# Patient Record
Sex: Female | Born: 1960 | Race: White | Hispanic: No | Marital: Single | Smoking: Current every day smoker
Health system: Southern US, Community
[De-identification: ages and names within clinical notes are randomized; demographics above are authoritative.]

## PROBLEM LIST (undated history)

## (undated) DIAGNOSIS — I639 Cerebral infarction, unspecified: Secondary | ICD-10-CM

## (undated) DIAGNOSIS — R7303 Prediabetes: Secondary | ICD-10-CM

## (undated) DIAGNOSIS — F32A Depression, unspecified: Secondary | ICD-10-CM

## (undated) DIAGNOSIS — K219 Gastro-esophageal reflux disease without esophagitis: Secondary | ICD-10-CM

## (undated) DIAGNOSIS — I10 Essential (primary) hypertension: Secondary | ICD-10-CM

## (undated) DIAGNOSIS — F419 Anxiety disorder, unspecified: Secondary | ICD-10-CM

## (undated) DIAGNOSIS — Z8619 Personal history of other infectious and parasitic diseases: Secondary | ICD-10-CM

## (undated) DIAGNOSIS — M1990 Unspecified osteoarthritis, unspecified site: Secondary | ICD-10-CM

## (undated) DIAGNOSIS — F909 Attention-deficit hyperactivity disorder, unspecified type: Secondary | ICD-10-CM

## (undated) DIAGNOSIS — F329 Major depressive disorder, single episode, unspecified: Secondary | ICD-10-CM

## (undated) HISTORY — DX: Major depressive disorder, single episode, unspecified: F32.9

## (undated) HISTORY — DX: Depression, unspecified: F32.A

## (undated) HISTORY — DX: Gastro-esophageal reflux disease without esophagitis: K21.9

## (undated) HISTORY — DX: Attention-deficit hyperactivity disorder, unspecified type: F90.9

## (undated) HISTORY — DX: Anxiety disorder, unspecified: F41.9

## (undated) HISTORY — PX: COLONOSCOPY: SHX174

## (undated) HISTORY — DX: Personal history of other infectious and parasitic diseases: Z86.19

---
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
## 1983-10-01 HISTORY — PX: CHOLECYSTECTOMY: SHX55

## 1993-09-30 HISTORY — PX: VAGINAL HYSTERECTOMY: SUR661

## 1993-09-30 HISTORY — PX: BLADDER SUSPENSION: SHX72

## 1994-09-30 LAB — HM COLONOSCOPY

## 1998-02-22 ENCOUNTER — Other Ambulatory Visit: Admission: RE | Admit: 1998-02-22 | Discharge: 1998-02-22 | Payer: Self-pay | Admitting: Obstetrics and Gynecology

## 2011-02-05 ENCOUNTER — Ambulatory Visit: Payer: Self-pay | Admitting: Internal Medicine

## 2011-02-05 LAB — HM MAMMOGRAPHY

## 2011-12-25 ENCOUNTER — Encounter: Payer: Self-pay | Admitting: Internal Medicine

## 2011-12-25 ENCOUNTER — Ambulatory Visit (INDEPENDENT_AMBULATORY_CARE_PROVIDER_SITE_OTHER): Payer: PRIVATE HEALTH INSURANCE | Admitting: Internal Medicine

## 2011-12-25 VITALS — BP 124/78 | HR 83 | Temp 98.1°F | Ht 64.5 in | Wt 121.0 lb

## 2011-12-25 DIAGNOSIS — G4700 Insomnia, unspecified: Secondary | ICD-10-CM

## 2011-12-25 DIAGNOSIS — F411 Generalized anxiety disorder: Secondary | ICD-10-CM

## 2011-12-25 DIAGNOSIS — F419 Anxiety disorder, unspecified: Secondary | ICD-10-CM | POA: Insufficient documentation

## 2011-12-25 DIAGNOSIS — R42 Dizziness and giddiness: Secondary | ICD-10-CM

## 2011-12-25 DIAGNOSIS — F909 Attention-deficit hyperactivity disorder, unspecified type: Secondary | ICD-10-CM

## 2011-12-25 LAB — HM MAMMOGRAPHY: HM Mammogram: NORMAL

## 2011-12-25 MED ORDER — CLONAZEPAM 0.5 MG PO TABS: 0.5000 mg | ORAL_TABLET | Freq: Every evening | ORAL | Status: DC | PRN

## 2011-12-25 NOTE — Assessment & Plan Note (Signed)
Secondary to anxiety. Improved with the use of Clonopin. We'll continue. Follow up 1 month.

## 2011-12-25 NOTE — Assessment & Plan Note (Signed)
No improvement would change to Wellbutrin. However, patient has recently been on dexamethasone which likely exacerbated her symptoms. We'll continue to monitor for now and plan followup in one month. If no improvement, will consider increasing dose of Wellbutrin to 300 mg daily.

## 2011-12-25 NOTE — Progress Notes (Signed)
Subjective:    Patient ID: Dana Benson, female    DOB: 10-Jul-1961, 51 y.o.   MRN: [id number]  HPI 51 year old female with history of ADHD and anxiety presents to establish care. She reports that her primary concern recently has been of dizziness and hearing loss in her left ear. She has been evaluated by Dr. Willeen Cass at ENT and he recommended that she have MRI brain. She reports that she initially refused to have the study and has delayed the exam for 2 weeks. She is currently taking dexamethasone to help with her dizziness. She reports significant increase in anxiety, sweats, and insomnia with the use of dexamethasone.  In regards to her anxiety, she reports she was previously treated with Celexa. She was recently changed to Wellbutrin because she felt that the Celexa was not helping. She continues to have significant anxiety and has not noticed improvement with wellbutrin. She has never been evaluated by psychiatry. She is also treated for ADHD but does not recall being tested for this.  Outpatient Encounter Prescriptions as of 12/25/2011  Medication Sig Dispense Refill  . amphetamine-dextroamphetamine (ADDERALL) 10 MG tablet Take 10 mg by mouth 2 (two) times daily.      Marland Kitchen buPROPion (WELLBUTRIN XL) 150 MG 24 hr tablet Take 150 mg by mouth daily.      . clonazePAM (KLONOPIN) 0.5 MG tablet Take 1 tablet (0.5 mg total) by mouth at bedtime as needed.  30 tablet  3  . dexamethasone (DECADRON) 4 MG tablet Take 4 mg by mouth 4 (four) times daily. X 14 days then start on 1 mg      . meclizine (ANTIVERT) 25 MG tablet Take 25 mg by mouth 3 (three) times daily as needed.      Marland Kitchen DISCONTD: clonazePAM (KLONOPIN) 0.5 MG tablet Take 0.5 mg by mouth at bedtime as needed.        Review of Systems  Constitutional: Negative for fever, chills, appetite change, fatigue and unexpected weight change.  HENT: Positive for hearing loss. Negative for ear pain, congestion, sore throat, trouble swallowing, neck pain,  voice change and sinus pressure.   Eyes: Negative for visual disturbance.  Respiratory: Negative for cough, shortness of breath, wheezing and stridor.   Cardiovascular: Negative for chest pain, palpitations and leg swelling.  Gastrointestinal: Negative for nausea, vomiting, abdominal pain, diarrhea, constipation, blood in stool, abdominal distention and anal bleeding.  Genitourinary: Negative for dysuria and flank pain.  Musculoskeletal: Negative for myalgias, arthralgias and gait problem.  Skin: Negative for color change and rash.  Neurological: Positive for dizziness. Negative for headaches.  Hematological: Negative for adenopathy. Does not bruise/bleed easily.  Psychiatric/Behavioral: Negative for suicidal ideas, sleep disturbance and dysphoric mood. The patient is nervous/anxious.        Objective:   Physical Exam  Constitutional: She is oriented to person, place, and time. She appears well-developed and well-nourished. No distress.  HENT:  Head: Normocephalic and atraumatic.  Right Ear: External ear normal.  Left Ear: External ear normal.  Nose: Nose normal.  Mouth/Throat: Oropharynx is clear and moist. No oropharyngeal exudate.  Eyes: Conjunctivae are normal. Pupils are equal, round, and reactive to light. Right eye exhibits no discharge. Left eye exhibits no discharge. No scleral icterus.  Neck: Normal range of motion. Neck supple. No tracheal deviation present. No thyromegaly present.  Cardiovascular: Normal rate, regular rhythm, normal heart sounds and intact distal pulses.  Exam reveals no gallop and no friction rub.   No murmur heard. Pulmonary/Chest: Effort  normal and breath sounds normal. No respiratory distress. She has no wheezes. She has no rales. She exhibits no tenderness.  Abdominal: Soft. Bowel sounds are normal. She exhibits no distension. There is no tenderness.  Musculoskeletal: Normal range of motion. She exhibits no edema and no tenderness.  Lymphadenopathy:     She has no cervical adenopathy.  Neurological: She is alert and oriented to person, place, and time. No cranial nerve deficit. She exhibits normal muscle tone. Coordination normal.  Skin: Skin is warm and dry. No rash noted. She is not diaphoretic. No erythema. No pallor.  Psychiatric: Her behavior is normal. Judgment and thought content normal. Her mood appears anxious.          Assessment & Plan:

## 2011-12-25 NOTE — Assessment & Plan Note (Signed)
Strongly encouraged her to follow through with the recommendations of her ENT physician to get MRI brain given concerning findings of hearing loss in the left ear. Will obtain records on evaluation and management. Followup one month.

## 2011-12-25 NOTE — Assessment & Plan Note (Signed)
Need to get records on previous evaluation. If she has never been evaluated by psychiatry, will plan to set her up for testing.

## 2012-01-02 ENCOUNTER — Telehealth: Payer: Self-pay | Admitting: Internal Medicine

## 2012-01-02 NOTE — Telephone Encounter (Signed)
PLEASE CALL Dana Benson AT WORK - [phone] or  Cell # [phone]  After 1600.  Caller: Kimori/Patient; PCP: Ronna Polio; CB#: [phone]; ; ; Call regarding Sleeping Concerns and Anxiety. If Not Able To Reach At Her Desk Please Call [phone];  [name] states she is taking currently taking Dexamethasone x 4 weeks ordered by ENT. Has had 25 % hearing loss and may have a growth in her ear. Scheduled for MRI next week. [name] states since taking steroid has had extreme fatigue, only getting about 3 hrs restless sleep every night, anxiety leading to intermittent painc attacks and blurred vision. States she has a family to take care of and has a job. States she has notified ENT provider of symptoms and states Meclizine was refilled.  Per anxiety protocol has call provider within 24 hrs disposition with provider due to increasing symptoms and taking meds as prescribed.

## 2012-01-02 NOTE — Telephone Encounter (Signed)
I scheduled pt for apt at 1:15 tomorrow (advised to come in at 1)

## 2012-01-02 NOTE — Telephone Encounter (Signed)
Needs to be seen

## 2012-01-02 NOTE — Telephone Encounter (Signed)
Pt is calling back regarding insomnia. Hasn't gotten a call back yet. While on the phone, pt gets a call from the ofc nurse who makes an appt for her.

## 2012-01-03 ENCOUNTER — Encounter: Payer: Self-pay | Admitting: Internal Medicine

## 2012-01-03 ENCOUNTER — Ambulatory Visit (INDEPENDENT_AMBULATORY_CARE_PROVIDER_SITE_OTHER): Payer: PRIVATE HEALTH INSURANCE | Admitting: Internal Medicine

## 2012-01-03 VITALS — BP 112/70 | HR 89 | Temp 98.5°F | Wt 122.0 lb

## 2012-01-03 DIAGNOSIS — K5900 Constipation, unspecified: Secondary | ICD-10-CM | POA: Insufficient documentation

## 2012-01-03 DIAGNOSIS — G4700 Insomnia, unspecified: Secondary | ICD-10-CM

## 2012-01-03 DIAGNOSIS — F419 Anxiety disorder, unspecified: Secondary | ICD-10-CM

## 2012-01-03 DIAGNOSIS — F411 Generalized anxiety disorder: Secondary | ICD-10-CM

## 2012-01-03 MED ORDER — CLONAZEPAM 0.5 MG PO TABS: 1.0000 mg | ORAL_TABLET | Freq: Three times a day (TID) | ORAL | Status: DC | PRN

## 2012-01-03 NOTE — Telephone Encounter (Signed)
Pt seen today

## 2012-01-03 NOTE — Telephone Encounter (Signed)
[facility] Triage Call Report Triage Record Num: [id number] Operator: Frederico Hamman Patient Name: Dana Benson Call Date & Time: 01/02/2012 2:24:37PM Patient Phone: [phone] PCP: Ronna Polio Patient Gender: Female PCP Fax : [phone] Patient DOB: 10-06-1960 Practice Name: [facility] Station Day Reason for Call: LELAR FAREWELL AT WORK [phone] or Cell - [phone] AFTER 1600 Caller: [name]; PCP: Ronna Polio; CB#: [phone]; ; ; Call regarding Sleeping Concerns and Anxiety. If Not Able To Reach At Her Desk Please Call [phone]; [name] states she is taking currently taking Dexamethasone x 4 weeks ordered by ENT. Has had 25 % hearing loss and may have a growth in her ear. Scheduled for MRI next week. [name] states since taking steroid has had extreme fatigue, only getting about 3 hrs restless sleep every night, anxiety leading to intermittent painc attacks and blurred vision. States she has a family to take care of and has a job. States she has notified ENT provider of symptoms and states Meclizine was refilled. Per anxiety protocol has call provider within 24 hrs disposition with provider due to increasing symptoms and taking meds as prescribed. Protocol(s) Used: Anxiety Protocol(s) Used: Anxiety: Panic Recommended Outcome per Protocol: Call Provider within 24 Hours Reason for Outcome: Describing severe anxiety or panic episodes (leading to deterioration or interference with ADLs) New or increasing symptoms AND taking medications/following therapy as prescribed Care Advice: ~ 01/02/2012 3:04:29PM Page 1 of 1 [id number]

## 2012-01-03 NOTE — Assessment & Plan Note (Signed)
Likely secondary to dehydration with steroid use. Started Miralax. Will also use Fleets enema. Encouraged pt to continue with increased fluid intake. Pt will call if no improvement.

## 2012-01-03 NOTE — Progress Notes (Signed)
Subjective:    Patient ID: Dana Benson, female    DOB: [birth year], 51 y.o.   MRN: [id number]  HPI 51 year old female presents for acute visit complaining of significant increase in anxiety, fatigue, increased depression and insomnia. Symptoms began when she first started taking dexamethasone which was prescribed by her ENT physician for possible mass within her ear. She has been taking dexamethasone 4 mg 4 times daily and has had significant difficulty sleeping. She reports some hallucinations seeing a basketball in the bed with her. She has been unable to sleep at night and has been significantly fatigued during the day. She has been using Klonopin at bedtime if no improvement. She has stopped taking her Adderall during the day. She also reports significant constipation and has not had a bowel movement in over one week. She started MiraLax with no improvement.  Outpatient Encounter Prescriptions as of 01/03/2012  Medication Sig Dispense Refill  . amphetamine-dextroamphetamine (ADDERALL) 10 MG tablet Take 10 mg by mouth 2 (two) times daily.      Marland Kitchen buPROPion (WELLBUTRIN XL) 150 MG 24 hr tablet Take 150 mg by mouth daily.      . clonazePAM (KLONOPIN) 0.5 MG tablet Take 2 tablets (1 mg total) by mouth 3 (three) times daily as needed.  30 tablet  3  . dexamethasone (DECADRON) 4 MG tablet Take 4 mg by mouth 4 (four) times daily. X 14 days then start on 1 mg      . meclizine (ANTIVERT) 25 MG tablet Take 25 mg by mouth 3 (three) times daily as needed.      Marland Kitchen DISCONTD: clonazePAM (KLONOPIN) 0.5 MG tablet Take 1 tablet (0.5 mg total) by mouth at bedtime as needed.  30 tablet  3    Review of Systems  Constitutional: Positive for fatigue. Negative for fever and chills.  Respiratory: Negative for cough.   Cardiovascular: Negative for chest pain.  Gastrointestinal: Positive for constipation and abdominal distention. Negative for abdominal pain, diarrhea, blood in stool and anal bleeding.    Psychiatric/Behavioral: Positive for hallucinations, dysphoric mood, decreased concentration and agitation. The patient is nervous/anxious.    BP 112/70  Pulse 89  Temp(Src) 98.5 F (36.9 C) (Oral)  Wt 122 lb (55.339 kg)  SpO2 97%     Objective:   Physical Exam  Constitutional: She is oriented to person, place, and time. She appears well-developed and well-nourished. No distress.  HENT:  Head: Normocephalic and atraumatic.  Right Ear: External ear normal.  Left Ear: External ear normal.  Nose: Nose normal.  Mouth/Throat: Oropharynx is clear and moist. No oropharyngeal exudate.  Eyes: Conjunctivae are normal. Pupils are equal, round, and reactive to light. Right eye exhibits no discharge. Left eye exhibits no discharge. No scleral icterus.  Neck: Normal range of motion. Neck supple. No tracheal deviation present. No thyromegaly present.  Cardiovascular: Normal rate, regular rhythm, normal heart sounds and intact distal pulses.  Exam reveals no gallop and no friction rub.   No murmur heard. Pulmonary/Chest: Effort normal and breath sounds normal. No respiratory distress. She has no wheezes. She has no rales. She exhibits no tenderness.  Musculoskeletal: Normal range of motion. She exhibits no edema and no tenderness.  Lymphadenopathy:    She has no cervical adenopathy.  Neurological: She is alert and oriented to person, place, and time. No cranial nerve deficit. She exhibits normal muscle tone. Coordination normal.  Skin: Skin is warm and dry. No rash noted. She is not diaphoretic. No erythema. No pallor.  Psychiatric: Her behavior is normal. Judgment normal. Her mood appears anxious. Her speech is rapid and/or pressured. Thought content is delusional. She exhibits a depressed mood.          Assessment & Plan:

## 2012-01-03 NOTE — Assessment & Plan Note (Signed)
Severe. 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 Will start tapering steroids and will increase Clonazepam to 1mg  po tid prn.  Pt has follow up with ENT Monday and will stay home from work through Tuesday. Follow up here in 2 weeks. Pt will email or call with update on Monday.

## 2012-01-03 NOTE — Patient Instructions (Signed)
Start Dexamethasone taper tomorrow.  Increase Clonazepam to 1mg  up to three times as needed. Note this medication will make you tired.  Stay out of work through Tuesday. Email or call Tuesday morning with update.  Use Fleets enema for constipation. Continue Miralax.

## 2012-01-08 ENCOUNTER — Encounter: Payer: Self-pay | Admitting: Internal Medicine

## 2012-01-10 ENCOUNTER — Ambulatory Visit: Payer: Self-pay | Admitting: Otolaryngology

## 2012-01-20 ENCOUNTER — Encounter: Payer: Self-pay | Admitting: Internal Medicine

## 2012-01-20 ENCOUNTER — Ambulatory Visit (INDEPENDENT_AMBULATORY_CARE_PROVIDER_SITE_OTHER): Payer: PRIVATE HEALTH INSURANCE | Admitting: Internal Medicine

## 2012-01-20 VITALS — BP 109/72 | HR 91 | Ht 64.5 in | Wt 122.0 lb

## 2012-01-20 DIAGNOSIS — F411 Generalized anxiety disorder: Secondary | ICD-10-CM

## 2012-01-20 DIAGNOSIS — R42 Dizziness and giddiness: Secondary | ICD-10-CM

## 2012-01-20 DIAGNOSIS — K5900 Constipation, unspecified: Secondary | ICD-10-CM

## 2012-01-20 DIAGNOSIS — F988 Other specified behavioral and emotional disorders with onset usually occurring in childhood and adolescence: Secondary | ICD-10-CM

## 2012-01-20 DIAGNOSIS — F419 Anxiety disorder, unspecified: Secondary | ICD-10-CM

## 2012-01-20 DIAGNOSIS — F909 Attention-deficit hyperactivity disorder, unspecified type: Secondary | ICD-10-CM

## 2012-01-20 MED ORDER — AMPHETAMINE-DEXTROAMPHETAMINE 10 MG PO TABS: 10.0000 mg | ORAL_TABLET | Freq: Two times a day (BID) | ORAL | Status: DC

## 2012-01-20 NOTE — Assessment & Plan Note (Signed)
No improvement with MiraLax or other over-the-counter laxatives. Will try adding Colace twice daily. Followup 2 weeks.

## 2012-01-20 NOTE — Assessment & Plan Note (Signed)
Patient has never been evaluated by psychiatry. Question whether she actually fits diagnosis of ADD. Symptoms seem more consistent with severe anxiety. We discussed the potential risk of her ADD medication including weight loss. We'll plan to set up evaluation by psychiatry in the future. We'll continue her current dose of medication for now.

## 2012-01-20 NOTE — Assessment & Plan Note (Signed)
Persistent symptoms since starting Wellbutrin. MRI was normal. Will discontinue Wellbutrin and see if any improvement. We also discussed vestibular therapy but she is not interested in this at this point.

## 2012-01-20 NOTE — Progress Notes (Signed)
Subjective:    Patient ID: Dana Benson, female    DOB: 02/19/1961, 51 y.o.   MRN: [id number]  HPI 51 year old female with history of ADD, anxiety, and recent episode of left sided hearing loss and dizziness presents for followup. She recently had MRI brain which was normal. She was told that her hearing loss is likely secondary to viral infection. She continues to have dizziness. This is intermittent and not precipitated by movement. She reports feeling generally poorly ever since starting on Wellbutrin. She generally "does not feel like herself ". She is anxious. She feels fatigued most of the time. She has difficulty sleeping. She continues to be constipated despite trying relax and over-the-counter medications.  Outpatient Encounter Prescriptions as of 01/20/2012  Medication Sig Dispense Refill  . amphetamine-dextroamphetamine (ADDERALL) 10 MG tablet Take 1 tablet (10 mg total) by mouth 2 (two) times daily.  60 tablet  0  . clonazePAM (KLONOPIN) 0.5 MG tablet Take 2 tablets (1 mg total) by mouth 3 (three) times daily as needed.  30 tablet  3  . meclizine (ANTIVERT) 25 MG tablet Take 25 mg by mouth 3 (three) times daily as needed.      . nystatin (MYCOSTATIN) 100000 UNIT/ML suspension Take 500,000 Units by mouth 4 (four) times daily.        Review of Systems  Constitutional: Positive for fatigue. Negative for fever, chills, appetite change and unexpected weight change.  HENT: Negative for ear pain, congestion, sore throat, trouble swallowing, neck pain, voice change and sinus pressure.   Eyes: Negative for visual disturbance.  Respiratory: Negative for cough, shortness of breath, wheezing and stridor.   Cardiovascular: Negative for chest pain, palpitations and leg swelling.  Gastrointestinal: Positive for constipation. Negative for nausea, vomiting, abdominal pain, diarrhea, blood in stool, abdominal distention and anal bleeding.  Genitourinary: Negative for dysuria and flank pain.    Musculoskeletal: Negative for myalgias, arthralgias and gait problem.  Skin: Negative for color change and rash.  Neurological: Negative for dizziness and headaches.  Hematological: Negative for adenopathy. Does not bruise/bleed easily.  Psychiatric/Behavioral: Positive for behavioral problems, sleep disturbance and agitation. Negative for suicidal ideas and dysphoric mood. The patient is nervous/anxious and is hyperactive.    BP 109/72  Pulse 91  Ht 5' 4.5" (1.638 m)  Wt 122 lb (55.339 kg)  BMI 20.62 kg/m2     Objective:   Physical Exam  Constitutional: She is oriented to person, place, and time. She appears well-developed and well-nourished. No distress.  HENT:  Head: Normocephalic and atraumatic.  Right Ear: External ear normal.  Left Ear: External ear normal.  Nose: Nose normal.  Mouth/Throat: Oropharynx is clear and moist. No oropharyngeal exudate.  Eyes: Conjunctivae are normal. Pupils are equal, round, and reactive to light. Right eye exhibits no discharge. Left eye exhibits no discharge. No scleral icterus.  Neck: Normal range of motion. Neck supple. No tracheal deviation present. No thyromegaly present.  Cardiovascular: Normal rate, regular rhythm, normal heart sounds and intact distal pulses.  Exam reveals no gallop and no friction rub.   No murmur heard. Pulmonary/Chest: Effort normal and breath sounds normal. No respiratory distress. She has no wheezes. She has no rales. She exhibits no tenderness.  Musculoskeletal: Normal range of motion. She exhibits no edema and no tenderness.  Lymphadenopathy:    She has no cervical adenopathy.  Neurological: She is alert and oriented to person, place, and time. No cranial nerve deficit. She exhibits normal muscle tone. Coordination normal.  Skin:  Skin is warm and dry. No rash noted. She is not diaphoretic. No erythema. No pallor.  Psychiatric: Her speech is normal and behavior is normal. Judgment and thought content normal. Her mood  appears anxious. Cognition and memory are normal.          Assessment & Plan:

## 2012-01-20 NOTE — Assessment & Plan Note (Signed)
No improvement on Wellbutrin. She has felt poorly since starting this medicine. Will stop Wellbutrin and plan to consider change to an alternative SSRI over the next few. Will give her a period off medication, however because she has concerns about sexual side effects of the anti-anxiety medicines.

## 2012-01-21 ENCOUNTER — Other Ambulatory Visit: Payer: Self-pay | Admitting: Internal Medicine

## 2012-01-21 LAB — CBC WITH DIFFERENTIAL/PLATELET
Eosinophil #: 0.1 10*3/uL (ref 0.0–0.7)
HCT: 38.7 % (ref 35.0–47.0)
HGB: 13 g/dL (ref 12.0–16.0)
Lymphocyte #: 1.9 10*3/uL (ref 1.0–3.6)
MCH: 31.6 pg (ref 26.0–34.0)
MCHC: 33.6 g/dL (ref 32.0–36.0)
MCV: 94 fL (ref 80–100)
Monocyte #: 0.4 x10 3/mm (ref 0.2–0.9)
Neutrophil #: 3 10*3/uL (ref 1.4–6.5)
Neutrophil %: 55 %
RBC: 4.12 10*6/uL (ref 3.80–5.20)
RDW: 13 % (ref 11.5–14.5)
WBC: 5.6 10*3/uL (ref 3.6–11.0)

## 2012-01-21 LAB — COMPREHENSIVE METABOLIC PANEL
Albumin: 3.5 g/dL (ref 3.4–5.0)
Anion Gap: 7 (ref 7–16)
Calcium, Total: 8.8 mg/dL (ref 8.5–10.1)
Chloride: 107 mmol/L (ref 98–107)
Creatinine: 0.96 mg/dL (ref 0.60–1.30)
EGFR (Non-African Amer.): >60
Glucose: 109 mg/dL — ABNORMAL HIGH (ref 65–99)
Osmolality: 282 (ref 275–301)
Potassium: 3.6 mmol/L (ref 3.5–5.1)
SGOT(AST): 38 U/L — ABNORMAL HIGH (ref 15–37)
SGPT (ALT): 69 U/L

## 2012-01-22 ENCOUNTER — Encounter: Payer: Self-pay | Admitting: Internal Medicine

## 2012-01-22 ENCOUNTER — Telehealth: Payer: Self-pay | Admitting: Internal Medicine

## 2012-01-22 NOTE — Telephone Encounter (Signed)
Labs normal.

## 2012-01-24 ENCOUNTER — Encounter: Payer: Self-pay | Admitting: Internal Medicine

## 2012-01-30 ENCOUNTER — Ambulatory Visit: Payer: PRIVATE HEALTH INSURANCE | Admitting: Internal Medicine

## 2012-02-26 ENCOUNTER — Encounter: Payer: Self-pay | Admitting: Internal Medicine

## 2012-02-26 ENCOUNTER — Ambulatory Visit (INDEPENDENT_AMBULATORY_CARE_PROVIDER_SITE_OTHER): Payer: PRIVATE HEALTH INSURANCE | Admitting: Internal Medicine

## 2012-02-26 VITALS — BP 120/80 | HR 78 | Temp 98.0°F | Ht 64.25 in | Wt 121.8 lb

## 2012-02-26 DIAGNOSIS — F32A Depression, unspecified: Secondary | ICD-10-CM

## 2012-02-26 DIAGNOSIS — F988 Other specified behavioral and emotional disorders with onset usually occurring in childhood and adolescence: Secondary | ICD-10-CM

## 2012-02-26 DIAGNOSIS — F909 Attention-deficit hyperactivity disorder, unspecified type: Secondary | ICD-10-CM

## 2012-02-26 DIAGNOSIS — R0989 Other specified symptoms and signs involving the circulatory and respiratory systems: Secondary | ICD-10-CM

## 2012-02-26 DIAGNOSIS — R0600 Dyspnea, unspecified: Secondary | ICD-10-CM | POA: Insufficient documentation

## 2012-02-26 DIAGNOSIS — F418 Other specified anxiety disorders: Secondary | ICD-10-CM | POA: Insufficient documentation

## 2012-02-26 DIAGNOSIS — F329 Major depressive disorder, single episode, unspecified: Secondary | ICD-10-CM

## 2012-02-26 MED ORDER — AMPHETAMINE-DEXTROAMPHETAMINE 10 MG PO TABS: 10.0000 mg | ORAL_TABLET | Freq: Two times a day (BID) | ORAL | Status: DC

## 2012-02-26 MED ORDER — FLUOXETINE HCL 20 MG PO TABS: 20.0000 mg | ORAL_TABLET | Freq: Every day | ORAL | Status: DC

## 2012-02-26 NOTE — Assessment & Plan Note (Addendum)
Pt has long history of smoking and strong family h/o CAD.  Complains of exertional dyspnea and diaphoresis without chest pain. Based on smoking history and exam with prolonged expiratory phase, suspect underlying COPD. Pt has never taken inhaled bronchodilators or steroids.  Will set up pulmonary evaluation with PFTs. Will also set up cardiology evaluation with possible treadmill given question of anginal equivalent. Note that we attempted EKG in clinic today, but were unable to obtain ECG because of technical issues. Follow up 1 month.

## 2012-02-26 NOTE — Assessment & Plan Note (Signed)
Will set up psychiatry evaluation.  As per previous notes, question if she fits diagnosis of ADD. Question if she may have bipolar disorder. For now, will continue adderral. We discussed that if her weight declines, we will stop this medication.

## 2012-02-26 NOTE — Assessment & Plan Note (Signed)
Pt describes severe episodes of depression. Question if she may have underlying bipolar disorder. Will start Prozac and set up referral to psychiatry for further evaluation. Follow up here 1 month.

## 2012-02-26 NOTE — Progress Notes (Signed)
Subjective:    Patient ID: Dana Benson, female    DOB: 1961/01/19, 51 y.o.   MRN: [id number]  HPI 50YO female with h/o ADD, anxiety, depression presents for follow up. Reports episodes of depressed mood lasting several days each time.  Has been able to function with work, but has some difficulty in participating in activities at home.  Also has some episodes of anxiety which have been affecting her work.  She has been taking Clonapin with some improvement.  She is also concerned today about recent episodes of exertional dyspnea. She reports that with certain activities such as lifting files on a shelf or walking she becomes diaphoretic and short of breath. These episodes resolve with rest.  They have been occuring for 1-2 months. She denies any chest pain or palpitations during this time. She is a smoker and smokes about half a pack per day for the last 20 years. She reports history of heart disease in her father. She has never had evaluation for CAD. She has never required the use of inhaled bronchodilators or steroids.  Outpatient Encounter Prescriptions as of 02/26/2012  Medication Sig Dispense Refill  . amphetamine-dextroamphetamine (ADDERALL) 10 MG tablet Take 1 tablet (10 mg total) by mouth 2 (two) times daily.  60 tablet  0  . clonazePAM (KLONOPIN) 0.5 MG tablet Take 2 tablets (1 mg total) by mouth 3 (three) times daily as needed.  30 tablet  3  . FLUoxetine (PROZAC) 20 MG tablet Take 1 tablet (20 mg total) by mouth daily.  30 tablet  3   BP 120/80  Pulse 78  Temp(Src) 98 F (36.7 C) (Oral)  Ht 5' 4.25" (1.632 m)  Wt 121 lb 12 oz (55.225 kg)  BMI 20.74 kg/m2  SpO2 100%  Review of Systems  Constitutional: Positive for fatigue. Negative for fever, chills, appetite change and unexpected weight change.  HENT: Negative for ear pain, congestion, sore throat, trouble swallowing, neck pain, voice change and sinus pressure.   Eyes: Negative for visual disturbance.  Respiratory: Positive  for shortness of breath. Negative for cough, wheezing and stridor.   Cardiovascular: Negative for chest pain, palpitations and leg swelling.  Gastrointestinal: Negative for nausea, vomiting, abdominal pain, diarrhea, constipation, blood in stool, abdominal distention and anal bleeding.  Genitourinary: Negative for dysuria and flank pain.  Musculoskeletal: Negative for myalgias, arthralgias and gait problem.  Skin: Negative for color change and rash.  Neurological: Negative for dizziness and headaches.  Hematological: Negative for adenopathy. Does not bruise/bleed easily.  Psychiatric/Behavioral: Positive for dysphoric mood, decreased concentration and agitation. Negative for suicidal ideas and sleep disturbance. The patient is nervous/anxious.        Objective:   Physical Exam  Constitutional: She is oriented to person, place, and time. She appears well-developed and well-nourished. No distress.  HENT:  Head: Normocephalic and atraumatic.  Right Ear: External ear normal.  Left Ear: External ear normal.  Nose: Nose normal.  Mouth/Throat: Oropharynx is clear and moist. No oropharyngeal exudate.  Eyes: Conjunctivae are normal. Pupils are equal, round, and reactive to light. Right eye exhibits no discharge. Left eye exhibits no discharge. No scleral icterus.  Neck: Normal range of motion. Neck supple. No tracheal deviation present. No thyromegaly present.  Cardiovascular: Normal rate, regular rhythm, normal heart sounds and intact distal pulses.  Exam reveals no gallop and no friction rub.   No murmur heard. Pulmonary/Chest: Effort normal. No accessory muscle usage. Not tachypneic. No respiratory distress. She has decreased breath sounds (prolonged  expiration). She has no wheezes. She has no rhonchi. She has no rales. She exhibits no tenderness.  Musculoskeletal: Normal range of motion. She exhibits no edema and no tenderness.  Lymphadenopathy:    She has no cervical adenopathy.    Neurological: She is alert and oriented to person, place, and time. No cranial nerve deficit. She exhibits normal muscle tone. Coordination normal.  Skin: Skin is warm and dry. No rash noted. She is not diaphoretic. No erythema. No pallor.  Psychiatric: Her speech is normal. Judgment and thought content normal. Her mood appears anxious. She is agitated and is hyperactive. Cognition and memory are normal. She exhibits a depressed mood.          Assessment & Plan:

## 2012-02-28 ENCOUNTER — Encounter: Payer: Self-pay | Admitting: Internal Medicine

## 2012-03-03 ENCOUNTER — Ambulatory Visit (INDEPENDENT_AMBULATORY_CARE_PROVIDER_SITE_OTHER): Payer: PRIVATE HEALTH INSURANCE | Admitting: Cardiovascular Disease

## 2012-03-03 ENCOUNTER — Encounter: Payer: Self-pay | Admitting: Cardiovascular Disease

## 2012-03-03 VITALS — BP 120/72 | HR 68 | Ht 64.0 in | Wt 122.0 lb

## 2012-03-03 DIAGNOSIS — R0989 Other specified symptoms and signs involving the circulatory and respiratory systems: Secondary | ICD-10-CM

## 2012-03-03 DIAGNOSIS — R0600 Dyspnea, unspecified: Secondary | ICD-10-CM

## 2012-03-03 NOTE — Patient Instructions (Signed)
Your physician wants you to follow-up in: 1 month with Dr. Elease Hashimoto. You will receive a reminder letter in the mail two months in advance. If you don't receive a letter, please call our office to schedule the follow-up appointment.  Your physician has requested that you have an echocardiogram. Echocardiography is a painless test that uses sound waves to create images of your heart. It provides your doctor with information about the size and shape of your heart and how well your heart's chambers and valves are working. This procedure takes approximately one hour. There are no restrictions for this procedure.  Walk daily

## 2012-03-03 NOTE — Assessment & Plan Note (Addendum)
Dana Benson with some atypical symptoms. She's having some shortness of breath with exertion. This seems to be better now she is off her prednisone. I suspect some of this may be due to COPD.  She has an incomplete right bundle branch block on baseline EKG and I don't think that a regular treadmill test will be as useful as if her EKG were completely normal.  Have asked her to exercise on a regular basis. She's to call me if she develops any episodes of chest pain or chest tightness. He is already scheduled to get a set of pulmonary function tests up hospital.  We'll get an echocardiogram at [facility].  I will see her again in one month for followup visit.

## 2012-03-03 NOTE — Progress Notes (Signed)
Dana Benson Date of Birth  [birth year]       [facility]    [place] [address]  [address], [address] [place], [place]  [address]   [place], [place]  [address] [phone]     [phone]   Fax  [phone]    Fax [phone]  Problem List: 1. Depression 2. Attention deficit disorder 3. Dyspnea  History of Present Illness:  Dana Benson is a 51 yo who we are asked to see for dyspena, fatigue and generalized weakness.  She has profound fatigue after walking up the stairs.  She has had some dizziness and was started on prednisone.  She thinks some of the dyspnea is due to the prednisone.  She has multiple vague complaints. She just is not feeling as well the she's to. She has not been to the doctor for many years and now feels like she is "falling apart".  Current Outpatient Prescriptions on File Prior to Visit  Medication Sig Dispense Refill  . FLUoxetine (PROZAC) 20 MG tablet Take 1 tablet (20 mg total) by mouth daily.  30 tablet  3  . DISCONTD: amphetamine-dextroamphetamine (ADDERALL) 10 MG tablet Take 1 tablet (10 mg total) by mouth 2 (two) times daily.  60 tablet  0  . DISCONTD: clonazePAM (KLONOPIN) 0.5 MG tablet Take 2 tablets (1 mg total) by mouth 3 (three) times daily as needed.  30 tablet  3    Allergies  Allergen Reactions  . Wellbutrin (Bupropion) Other (See Comments)    Dizziness and ? Hearing loss    Past Medical History  Diagnosis Date  . History of chicken pox   . Depression   . GERD (gastroesophageal reflux disease)   . Allergic rhinitis   . ADD (attention deficit disorder with hyperactivity)   . Anxiety     Past Surgical History  Procedure Date  . Vaginal hysterectomy 1995  . Tonsillectomy 1970  . Appendectomy 1979  . Bladder suspension 1995    during hysterectomy  . Cholecystectomy 1985    History  Smoking status  . Current Everyday Smoker -- 0.5 packs/day for 20 years  . Types: Cigarettes  Smokeless  tobacco  . Never Used  Comment: 2 packs a week    History  Alcohol Use  . 0.6 oz/week  . 1 Glasses of wine per week    Wine HS occasional    Family History  Problem Relation Age of Onset  . Depression Mother   . Depression Son   . Depression Maternal Aunt     Reviw of Systems:  Reviewed in the HPI.  All other systems are negative.  Physical Exam: Blood pressure 120/72, pulse 68, height 5\' 4"  (1.626 m), weight 122 lb (55.339 kg). General: Well developed, well nourished, in no acute distress.  Head: Normocephalic, atraumatic, sclera non-icteric, mucus membranes are moist,   Neck: Supple. Carotids are 2 + without bruits. No JVD  Lungs: few scattered wheezes  Heart: regular rate.  split S1 S2. No murmurs, gallops or rubs.  Abdomen: Soft, non-tender, non-distended with normal bowel sounds. No hepatomegaly. No rebound/guarding. No masses.  Msk:  Strength and tone are normal  Extremities: No clubbing or cyanosis. No edema.  Distal pedal pulses are 2+ and equal bilaterally.  Neuro: Alert and oriented X 3. Moves all extremities spontaneously.  Psych:  Responds to questions appropriately with a normal affect.  ECG: March 03, 2012-- NSR at 28.  Inc. RBBB.  Assessment /  Plan:

## 2012-03-11 ENCOUNTER — Other Ambulatory Visit: Payer: Self-pay

## 2012-03-11 DIAGNOSIS — R0600 Dyspnea, unspecified: Secondary | ICD-10-CM

## 2012-03-12 ENCOUNTER — Ambulatory Visit: Payer: Self-pay

## 2012-03-12 DIAGNOSIS — I059 Rheumatic mitral valve disease, unspecified: Secondary | ICD-10-CM

## 2012-03-16 ENCOUNTER — Other Ambulatory Visit: Payer: Self-pay | Admitting: Cardiovascular Disease

## 2012-03-16 ENCOUNTER — Ambulatory Visit: Payer: PRIVATE HEALTH INSURANCE | Admitting: Cardiovascular Disease

## 2012-03-16 DIAGNOSIS — R0600 Dyspnea, unspecified: Secondary | ICD-10-CM

## 2012-03-26 ENCOUNTER — Ambulatory Visit: Payer: PRIVATE HEALTH INSURANCE | Admitting: Internal Medicine

## 2012-04-06 ENCOUNTER — Institutional Professional Consult (permissible substitution): Payer: PRIVATE HEALTH INSURANCE | Admitting: Pulmonary Disease

## 2012-04-15 ENCOUNTER — Ambulatory Visit: Payer: PRIVATE HEALTH INSURANCE | Admitting: Internal Medicine

## 2012-04-22 ENCOUNTER — Ambulatory Visit: Payer: PRIVATE HEALTH INSURANCE | Admitting: Cardiovascular Disease

## 2012-04-27 ENCOUNTER — Ambulatory Visit (INDEPENDENT_AMBULATORY_CARE_PROVIDER_SITE_OTHER): Payer: PRIVATE HEALTH INSURANCE | Admitting: Internal Medicine

## 2012-04-27 ENCOUNTER — Encounter: Payer: Self-pay | Admitting: Internal Medicine

## 2012-04-27 VITALS — BP 122/76 | HR 63 | Temp 98.0°F | Ht 64.25 in | Wt 123.5 lb

## 2012-04-27 DIAGNOSIS — F329 Major depressive disorder, single episode, unspecified: Secondary | ICD-10-CM

## 2012-04-27 DIAGNOSIS — F900 Attention-deficit hyperactivity disorder, predominantly inattentive type: Secondary | ICD-10-CM | POA: Insufficient documentation

## 2012-04-27 DIAGNOSIS — F988 Other specified behavioral and emotional disorders with onset usually occurring in childhood and adolescence: Secondary | ICD-10-CM

## 2012-04-27 DIAGNOSIS — K5909 Other constipation: Secondary | ICD-10-CM | POA: Insufficient documentation

## 2012-04-27 DIAGNOSIS — Z1211 Encounter for screening for malignant neoplasm of colon: Secondary | ICD-10-CM | POA: Insufficient documentation

## 2012-04-27 DIAGNOSIS — F32A Depression, unspecified: Secondary | ICD-10-CM

## 2012-04-27 DIAGNOSIS — K5900 Constipation, unspecified: Secondary | ICD-10-CM

## 2012-04-27 MED ORDER — FLUOXETINE HCL 40 MG PO CAPS: 40.0000 mg | ORAL_CAPSULE | Freq: Every day | ORAL | Status: DC

## 2012-04-27 MED ORDER — CLONAZEPAM 0.5 MG PO TABS: 1.0000 mg | ORAL_TABLET | Freq: Every day | ORAL | Status: DC

## 2012-04-27 MED ORDER — AMPHETAMINE-DEXTROAMPHETAMINE 10 MG PO TABS: 10.0000 mg | ORAL_TABLET | Freq: Two times a day (BID) | ORAL | Status: DC

## 2012-04-27 NOTE — Progress Notes (Signed)
Subjective:    Patient ID: Dana Benson, female    DOB: 05-28-1961, 51 y.o.   MRN: [id number]  HPI 51 year old female with history of ADHD, anxiety, tobacco abuse presents for followup. She reports she is generally doing well. She was recently started on Prozac to help with anxiety and depression. She reports that symptoms are improved but she feels that she could make more of an improvement with increased dose. She has noted some increased frequency of nightmares on Prozac. Otherwise, reports she is tolerating it well. Next  She notes she recently fell when climbing out of a pull. She hit her left rib cage on a rail. She denies any bruising over the site but notes some tenderness at the site. She denies shortness of breath.  She also notes several year history of difficulty with constipation. She reports that she typically goes an entire week without having a bowel movement and then uses laxatives on the weekend to help soften her stool. She tried MiraLax in the past but felt this caused increased cramping that was not tolerable at work. She reports difficulty passing stool. She denies any blood in her stool. She has some abdominal distention chronically. She denies nausea or vomiting.  Outpatient Encounter Prescriptions as of 04/27/2012  Medication Sig Dispense Refill  . amphetamine-dextroamphetamine (ADDERALL) 10 MG tablet Take 1 tablet (10 mg total) by mouth 2 (two) times daily.  60 tablet  0  . clonazePAM (KLONOPIN) 0.5 MG tablet Take 2 tablets (1 mg total) by mouth daily.  30 tablet  3  . ibuprofen (ADVIL,MOTRIN) 200 MG tablet Take 200 mg by mouth as needed.      Marland Kitchen FLUoxetine (PROZAC) 40 MG capsule Take 1 capsule (40 mg total) by mouth daily.  90 capsule  3   BP 122/76  Pulse 63  Temp 98 F (36.7 C) (Oral)  Ht 5' 4.25" (1.632 m)  Wt 123 lb 8 oz (56.019 kg)  BMI 21.03 kg/m2  SpO2 96%  Review of Systems  Constitutional: Negative for fever, chills, appetite change, fatigue and  unexpected weight change.  HENT: Negative for ear pain, congestion, sore throat, trouble swallowing, neck pain, voice change and sinus pressure.   Eyes: Negative for visual disturbance.  Respiratory: Negative for cough, shortness of breath, wheezing and stridor.   Cardiovascular: Negative for chest pain, palpitations and leg swelling.  Gastrointestinal: Positive for constipation. Negative for nausea, vomiting, abdominal pain, diarrhea, blood in stool, abdominal distention and anal bleeding.  Genitourinary: Negative for dysuria and flank pain.  Musculoskeletal: Negative for myalgias, arthralgias and gait problem.  Skin: Negative for color change and rash.  Neurological: Negative for dizziness and headaches.  Hematological: Negative for adenopathy. Does not bruise/bleed easily.  Psychiatric/Behavioral: Negative for suicidal ideas, disturbed wake/sleep cycle and dysphoric mood. The patient is not nervous/anxious.        Objective:   Physical Exam  Constitutional: She is oriented to person, place, and time. She appears well-developed and well-nourished. No distress.  HENT:  Head: Normocephalic and atraumatic.  Right Ear: External ear normal.  Left Ear: External ear normal.  Nose: Nose normal.  Mouth/Throat: Oropharynx is clear and moist. No oropharyngeal exudate.  Eyes: Conjunctivae are normal. Pupils are equal, round, and reactive to light. Right eye exhibits no discharge. Left eye exhibits no discharge. No scleral icterus.  Neck: Normal range of motion. Neck supple. No tracheal deviation present. No thyromegaly present.  Cardiovascular: Normal rate, regular rhythm, normal heart sounds and intact distal pulses.  Exam reveals no gallop and no friction rub.   No murmur heard. Pulmonary/Chest: Effort normal and breath sounds normal. No respiratory distress. She has no wheezes. She has no rales. She exhibits no tenderness.  Abdominal: Soft. Bowel sounds are normal. She exhibits distension. She  exhibits no mass. There is no tenderness. There is no guarding.  Musculoskeletal: Normal range of motion. She exhibits no edema and no tenderness.  Lymphadenopathy:    She has no cervical adenopathy.  Neurological: She is alert and oriented to person, place, and time. No cranial nerve deficit. She exhibits normal muscle tone. Coordination normal.  Skin: Skin is warm and dry. No rash noted. She is not diaphoretic. No erythema. No pallor.  Psychiatric: She has a normal mood and affect. Her behavior is normal. Judgment and thought content normal.          Assessment & Plan:

## 2012-04-27 NOTE — Assessment & Plan Note (Signed)
Symptoms well controlled with use of Adderall. Will continue.

## 2012-04-27 NOTE — Assessment & Plan Note (Signed)
Patient with chronic constipation. She has been unable to tolerate mild laxatives such as MiraLax. Will set up GI evaluation with colonoscopy.

## 2012-04-27 NOTE — Assessment & Plan Note (Signed)
Symptoms improved but not resolved with the use of Prozac. Will increase dose to 40 mg. Followup one month.

## 2012-04-28 ENCOUNTER — Telehealth: Payer: Self-pay | Admitting: Internal Medicine

## 2012-04-28 NOTE — Telephone Encounter (Signed)
Patient is scheduled at [facility] for her PFT on Aug 1,2013 @ 8:00 arrive at 7:30 No inhalers the morning of Procedure light breakfast . Any Broncho dilators hold 4hrs. Short acting. Long acting broncho Dilator hold 12 hrs. No smoking 1 hr prior and through out.

## 2012-04-29 ENCOUNTER — Other Ambulatory Visit: Payer: Self-pay | Admitting: Internal Medicine

## 2012-04-29 DIAGNOSIS — R0602 Shortness of breath: Secondary | ICD-10-CM

## 2012-04-30 ENCOUNTER — Ambulatory Visit: Payer: Self-pay | Admitting: Internal Medicine

## 2012-04-30 LAB — PULMONARY FUNCTION TEST

## 2012-05-04 ENCOUNTER — Telehealth: Payer: Self-pay | Admitting: Internal Medicine

## 2012-05-04 NOTE — Telephone Encounter (Signed)
Message copied by [employer] on Mon May 04, 2012  5:04 PM ------      Message from: Gilmer Mor      Created: Mon May 04, 2012  1:43 PM       Please put in pulmonology referral with Dr. Kendrick Fries.

## 2012-05-04 NOTE — Telephone Encounter (Signed)
Patient notified

## 2012-05-04 NOTE — Telephone Encounter (Signed)
Lung function tests showed early COPD. Would recommend setting up evaluation with pulmonary medicine as discussed. Would recommend Dr. Kendrick Fries,

## 2012-05-04 NOTE — Telephone Encounter (Signed)
Patient advised as instructed via telephone, she will wait to hear from Vanessa. 

## 2012-05-04 NOTE — Telephone Encounter (Signed)
I have already put in referral once.  She can call our office [phone] and set up appointment with him. She canceled first appt.

## 2012-05-07 ENCOUNTER — Encounter: Payer: Self-pay | Admitting: Internal Medicine

## 2012-05-11 ENCOUNTER — Ambulatory Visit: Payer: Self-pay | Admitting: Internal Medicine

## 2012-05-12 ENCOUNTER — Telehealth: Payer: Self-pay | Admitting: Internal Medicine

## 2012-05-12 ENCOUNTER — Ambulatory Visit: Payer: Self-pay | Admitting: Internal Medicine

## 2012-05-12 NOTE — Telephone Encounter (Signed)
Recent mammogram 05/11/2012 showed asymmetry in the left breast. Recommended additional views. We should set this up at [facility] and make sure pt aware.

## 2012-05-12 NOTE — Telephone Encounter (Signed)
Patient advised as instructed via telephone. 

## 2012-05-12 NOTE — Telephone Encounter (Signed)
Additional views on left mammogram were normal.

## 2012-05-12 NOTE — Telephone Encounter (Signed)
Patient advised via telephone, she had additional views this morning.

## 2012-05-19 ENCOUNTER — Encounter: Payer: Self-pay | Admitting: Internal Medicine

## 2012-05-20 ENCOUNTER — Encounter: Payer: Self-pay | Admitting: Internal Medicine

## 2012-05-27 ENCOUNTER — Ambulatory Visit: Payer: PRIVATE HEALTH INSURANCE | Admitting: Cardiovascular Disease

## 2012-07-20 ENCOUNTER — Other Ambulatory Visit: Payer: Self-pay | Admitting: Internal Medicine

## 2012-07-20 MED ORDER — ALPRAZOLAM 0.25 MG PO TABS: 0.2500 mg | ORAL_TABLET | Freq: Three times a day (TID) | ORAL | Status: DC | PRN

## 2012-07-20 NOTE — Telephone Encounter (Signed)
Pt's youngest child passed away on 03/14/2023 and she was wondering if some xanax could be called into [facility].

## 2012-07-20 NOTE — Telephone Encounter (Signed)
Patient advised as instructed via telephone, Rx called to [facility] pharmacy.  Follow up appt moved from 07/30/2012 to 07/28/2012 per patients request.

## 2012-07-20 NOTE — Telephone Encounter (Signed)
We can call in Xanax 0.25mg  po tid prn #10. Please make a followup next week

## 2012-07-24 ENCOUNTER — Telehealth: Payer: Self-pay | Admitting: Internal Medicine

## 2012-07-24 ENCOUNTER — Other Ambulatory Visit: Payer: Self-pay | Admitting: *Deleted

## 2012-07-24 MED ORDER — ALPRAZOLAM 0.25 MG PO TABS: 0.2500 mg | ORAL_TABLET | Freq: Three times a day (TID) | ORAL | Status: DC | PRN

## 2012-07-24 NOTE — Telephone Encounter (Signed)
We can refill the short supply of xanax, as written last week x1.

## 2012-07-24 NOTE — Telephone Encounter (Signed)
Pt is calling concerning her xanax. Her sons funeral was Tuesday and she says she ran out of pills on Wednesday. She says she has an appointment next Tuesday morning but she was wondering if you could prescribe some more to last until Next Tuesday. She is not currently taking her adderall or her Generic for Clonapen?? She uses [employer].

## 2012-07-24 NOTE — Telephone Encounter (Signed)
Called to [facility] Spoke w/ Dana Benson. Patient is aware.

## 2012-07-28 ENCOUNTER — Encounter: Payer: Self-pay | Admitting: Internal Medicine

## 2012-07-28 ENCOUNTER — Ambulatory Visit (INDEPENDENT_AMBULATORY_CARE_PROVIDER_SITE_OTHER): Payer: PRIVATE HEALTH INSURANCE | Admitting: Internal Medicine

## 2012-07-28 VITALS — BP 152/82 | HR 71 | Temp 98.3°F | Ht 64.5 in | Wt 123.8 lb

## 2012-07-28 DIAGNOSIS — F32A Depression, unspecified: Secondary | ICD-10-CM

## 2012-07-28 DIAGNOSIS — F329 Major depressive disorder, single episode, unspecified: Secondary | ICD-10-CM

## 2012-07-28 DIAGNOSIS — F3289 Other specified depressive episodes: Secondary | ICD-10-CM

## 2012-07-28 MED ORDER — CLONAZEPAM 0.5 MG PO TABS: 1.0000 mg | ORAL_TABLET | Freq: Three times a day (TID) | ORAL | Status: DC | PRN

## 2012-07-28 NOTE — Progress Notes (Signed)
  Subjective:    Patient ID: Dana Benson, female    DOB: June 12, 1961, 51 y.o.   MRN: [id number]  HPI 51YO female with h/o anxiety/depression presents for acute visit to discuss worsening anxiety/depression after unexpected death of her 20YO son 2 weeks ago.  Son died while at college. Erasmo Score was last week.  Pt describes feeling "out of it" and frequently tearful, with worsening feelings of depression after recent funeral.  Still unable to get son's belongings from school.  Has support of mother, sister, and other son, who is staying with her.  Taking some xanax with minimal improvement in symptoms.  Outpatient Encounter Prescriptions as of 07/28/2012  Medication Sig Dispense Refill  . amphetamine-dextroamphetamine (ADDERALL) 10 MG tablet Take 1 tablet (10 mg total) by mouth 2 (two) times daily.  60 tablet  0  . FLUoxetine (PROZAC) 40 MG capsule Take 1 capsule (40 mg total) by mouth daily.  90 capsule  3  . ibuprofen (ADVIL,MOTRIN) 200 MG tablet Take 200 mg by mouth as needed.      Marland Kitchen DISCONTD: ALPRAZolam (XANAX) 0.25 MG tablet Take 1 tablet (0.25 mg total) by mouth 3 (three) times daily as needed.  10 tablet  0  . clonazePAM (KLONOPIN) 0.5 MG tablet Take 2 tablets (1 mg total) by mouth 3 (three) times daily as needed for anxiety.  90 tablet  3  . DISCONTD: clonazePAM (KLONOPIN) 0.5 MG tablet Take 2 tablets (1 mg total) by mouth daily.  30 tablet  3   BP 152/82  Pulse 71  Temp 98.3 F (36.8 C) (Oral)  Ht 5' 4.5" (1.638 m)  Wt 123 lb 12 oz (56.133 kg)  BMI 20.91 kg/m2  SpO2 98%  Review of Systems  Constitutional: Negative for fever and chills.  Respiratory: Negative for shortness of breath.   Cardiovascular: Negative for chest pain.  Gastrointestinal: Negative for abdominal pain.  Psychiatric/Behavioral: Positive for confusion, disturbed wake/sleep cycle, dysphoric mood and decreased concentration. Negative for suicidal ideas. The patient is nervous/anxious.        Objective:   Physical Exam  Constitutional: She is oriented to person, place, and time. She appears well-developed and well-nourished. No distress.  HENT:  Head: Normocephalic and atraumatic.  Right Ear: External ear normal.  Left Ear: External ear normal.  Nose: Nose normal.  Mouth/Throat: Oropharynx is clear and moist.  Eyes: Conjunctivae normal are normal. Pupils are equal, round, and reactive to light. Right eye exhibits no discharge. Left eye exhibits no discharge. No scleral icterus.  Neck: Normal range of motion. Neck supple. No tracheal deviation present. No thyromegaly present.  Pulmonary/Chest: Effort normal.  Musculoskeletal: Normal range of motion. She exhibits no edema and no tenderness.  Lymphadenopathy:    She has no cervical adenopathy.  Neurological: She is alert and oriented to person, place, and time. No cranial nerve deficit. She exhibits normal muscle tone. Coordination normal.  Skin: Skin is warm and dry. No rash noted. She is not diaphoretic. No erythema. No pallor.  Psychiatric: Her behavior is normal. Judgment and thought content normal. Her mood appears anxious. Her speech is tangential. Cognition and memory are normal. She exhibits a depressed mood. She expresses no suicidal ideation.          Assessment & Plan:

## 2012-07-28 NOTE — Assessment & Plan Note (Signed)
Symptoms recently worsened with unexpected death of 51 year old son. Offered support today. Recommended counseling and gave pt contact info for Dr. Delorse Lek.  Will increase Clonapin to 1mg  up to three times daily. Encouraged pt to take time off from work as needed.  Over 25 min face to face time spent discussing plan for management of depression and anxiety.

## 2012-07-30 ENCOUNTER — Encounter: Payer: PRIVATE HEALTH INSURANCE | Admitting: Internal Medicine

## 2012-08-03 ENCOUNTER — Telehealth: Payer: Self-pay | Admitting: Internal Medicine

## 2012-08-03 NOTE — Telephone Encounter (Signed)
Pt is calling concerning her depression. She says it is getting extremely bad. She tried calling her Phsycologist but she was out of the office this whole week for family emergency so she hasn't been able to talk to anyone about what was going on. She can't sleep, can't eat and is scared this isn't normal. She wasn't sure what to do or if she should come in to be seen. She is very upset and is needing some help or somewhere to turn.

## 2012-08-03 NOTE — Telephone Encounter (Signed)
We should make her an appointment to be seen. If nothing else available, may put in tomorrow at 12:30

## 2012-08-04 ENCOUNTER — Ambulatory Visit (INDEPENDENT_AMBULATORY_CARE_PROVIDER_SITE_OTHER): Payer: PRIVATE HEALTH INSURANCE | Admitting: Internal Medicine

## 2012-08-04 ENCOUNTER — Encounter: Payer: Self-pay | Admitting: Internal Medicine

## 2012-08-04 VITALS — BP 138/80 | HR 79 | Temp 98.1°F | Ht 64.5 in | Wt 123.8 lb

## 2012-08-04 DIAGNOSIS — F32A Depression, unspecified: Secondary | ICD-10-CM

## 2012-08-04 DIAGNOSIS — F329 Major depressive disorder, single episode, unspecified: Secondary | ICD-10-CM

## 2012-08-04 NOTE — Progress Notes (Signed)
  Subjective:    Patient ID: Dana Benson, female    DOB: 03/24/1961, 51 y.o.   MRN: [id number]  HPI 51 year old female presents for acute visit complaining of worsening symptoms of depression and anxiety after the recent death of her son. She was seen in our clinic last week, and we increased dose of Prozac and Klonopin to help with symptoms. She reports minimal improvement with this. She reports difficulty functioning including performing activities of daily living such as bathing. She reports frequent tearfulness. She reports lack of interest in spending time with others. She has not yet returned to work. She has called a local counselor to set up an appointment and this is scheduled for next week. She does report support from her family members, particularly her sister. She reports difficulty sleeping, mostly difficulty falling asleep.  Outpatient Encounter Prescriptions as of 08/04/2012  Medication Sig Dispense Refill  . clonazePAM (KLONOPIN) 0.5 MG tablet Take 2 tablets (1 mg total) by mouth 3 (three) times daily as needed for anxiety.  90 tablet  3  . FLUoxetine (PROZAC) 40 MG capsule Take 1 capsule (40 mg total) by mouth daily.  90 capsule  3  . ibuprofen (ADVIL,MOTRIN) 200 MG tablet Take 200 mg by mouth as needed.      Marland Kitchen amphetamine-dextroamphetamine (ADDERALL) 10 MG tablet Take 1 tablet (10 mg total) by mouth 2 (two) times daily.  60 tablet  0   BP 138/80  Pulse 79  Temp 98.1 F (36.7 C) (Oral)  Ht 5' 4.5" (1.638 m)  Wt 123 lb 12 oz (56.133 kg)  BMI 20.91 kg/m2  SpO2 97%  Review of Systems  Constitutional: Positive for fatigue. Negative for fever, chills, appetite change and unexpected weight change.  Eyes: Negative for visual disturbance.  Respiratory: Negative for cough and shortness of breath.   Cardiovascular: Positive for palpitations. Negative for chest pain.  Psychiatric/Behavioral: Positive for sleep disturbance, dysphoric mood, decreased concentration and agitation.  Negative for suicidal ideas. The patient is nervous/anxious.        Objective:   Physical Exam  Constitutional: She appears well-developed and well-nourished. No distress.  HENT:  Head: Normocephalic.  Neck: Normal range of motion.  Pulmonary/Chest: Effort normal.  Skin: She is not diaphoretic.  Psychiatric: Her speech is normal and behavior is normal. Judgment and thought content normal. Her mood appears anxious. Cognition and memory are normal. She exhibits a depressed mood. She expresses no homicidal and no suicidal ideation.          Assessment & Plan:

## 2012-08-04 NOTE — Telephone Encounter (Signed)
Patient seen today by Dr. Dan Humphreys.

## 2012-08-04 NOTE — Assessment & Plan Note (Addendum)
We discussed the stages of grief and the fact that there will be some fluctuation in moods between acceptance, sadness, anger, and denial. Encourage patient to follow through with appointment with counselor next week. Will try increasing Clonopin to 1.5 mg at bedtime to help with sleep. FMLA paperwork filled out to allow patient additional time off from work. Patient will call if symptoms are worsening or if any concerns. Over face to face time spent with patient discussing depression, bereavement, and treatment.

## 2012-08-06 ENCOUNTER — Other Ambulatory Visit: Payer: Self-pay | Admitting: Gastroenterology

## 2012-08-06 LAB — COMPREHENSIVE METABOLIC PANEL
Alkaline Phosphatase: 98 U/L (ref 50–136)
Calcium, Total: 9.7 mg/dL (ref 8.5–10.1)
Co2: 27 mmol/L (ref 21–32)
EGFR (Non-African Amer.): >60
Osmolality: 275 (ref 275–301)
SGOT(AST): 34 U/L (ref 15–37)
SGPT (ALT): 33 U/L (ref 12–78)

## 2012-08-06 LAB — PROTIME-INR
INR: 1
Prothrombin Time: 13.8 secs (ref 11.5–14.7)

## 2012-08-06 LAB — CBC WITH DIFFERENTIAL/PLATELET
Basophil #: 0.1 10*3/uL (ref 0.0–0.1)
Basophil %: 0.9 %
Eosinophil #: 0.2 10*3/uL (ref 0.0–0.7)
HCT: 39.5 % (ref 35.0–47.0)
HGB: 13.4 g/dL (ref 12.0–16.0)
Lymphocyte #: 2.8 10*3/uL (ref 1.0–3.6)
Lymphocyte %: 27.9 %
MCHC: 33.8 g/dL (ref 32.0–36.0)
MCV: 94 fL (ref 80–100)
Monocyte #: 0.8 x10 3/mm (ref 0.2–0.9)
Monocyte %: 8.3 %
Neutrophil #: 6.1 10*3/uL (ref 1.4–6.5)
RBC: 4.2 10*6/uL (ref 3.80–5.20)
WBC: 9.9 10*3/uL (ref 3.6–11.0)

## 2012-08-06 LAB — TSH: Thyroid Stimulating Horm: 0.976 u[IU]/mL

## 2012-08-11 ENCOUNTER — Ambulatory Visit: Payer: Self-pay | Admitting: Gastroenterology

## 2012-08-20 ENCOUNTER — Ambulatory Visit (INDEPENDENT_AMBULATORY_CARE_PROVIDER_SITE_OTHER): Payer: PRIVATE HEALTH INSURANCE | Admitting: Internal Medicine

## 2012-08-20 ENCOUNTER — Telehealth: Payer: Self-pay | Admitting: Internal Medicine

## 2012-08-20 ENCOUNTER — Encounter: Payer: Self-pay | Admitting: Internal Medicine

## 2012-08-20 VITALS — BP 138/80 | HR 57 | Temp 97.7°F | Resp 16 | Ht 64.5 in | Wt 123.8 lb

## 2012-08-20 DIAGNOSIS — Z01818 Encounter for other preprocedural examination: Secondary | ICD-10-CM

## 2012-08-20 DIAGNOSIS — F32A Depression, unspecified: Secondary | ICD-10-CM

## 2012-08-20 DIAGNOSIS — F329 Major depressive disorder, single episode, unspecified: Secondary | ICD-10-CM

## 2012-08-20 NOTE — Telephone Encounter (Signed)
Pt stated if dr walker would fax dictated office note for today 11/21 to extend till 12/2 Fax # [phone]

## 2012-08-20 NOTE — Telephone Encounter (Signed)
I have typed and printed this letter. Do you mind faxing?

## 2012-08-20 NOTE — Progress Notes (Signed)
Subjective:    Patient ID: Dana Benson, female    DOB: 1960-11-10, 51 y.o.   MRN: [id number]  HPI 51 year old female with history of anxiety and depression presents for preoperative clearance prior to colonoscopy and upper endoscopy. She reports continued depressed mood and increased anxiety after the recent death of her son. However, she has started counseling with a local psychologist and reports that medications are helping her symptoms. She continues to be out of work but is hoping to go back within the next couple of weeks.  In regards to preoperative clearance, she reports no previous issues with anesthesia. She denies any history of easy bleeding or bruising. She denies any recent upper respiratory or urinary tract infections. She denies any recent fever, chills. She reports physically she has been feeling well.  Outpatient Encounter Prescriptions as of 08/20/2012  Medication Sig Dispense Refill  . clonazePAM (KLONOPIN) 0.5 MG tablet Take 2 tablets (1 mg total) by mouth 3 (three) times daily as needed for anxiety.  90 tablet  3  . FLUoxetine (PROZAC) 40 MG capsule Take 1 capsule (40 mg total) by mouth daily.  90 capsule  3  . omeprazole (PRILOSEC) 20 MG capsule Take 20 mg by mouth daily.      Marland Kitchen amphetamine-dextroamphetamine (ADDERALL) 10 MG tablet Take 1 tablet (10 mg total) by mouth 2 (two) times daily.  60 tablet  0  . [DISCONTINUED] ibuprofen (ADVIL,MOTRIN) 200 MG tablet Take 200 mg by mouth as needed.       BP 138/80  Pulse 57  Temp 97.7 F (36.5 C) (Oral)  Resp 16  Ht 5' 4.5" (1.638 m)  Wt 123 lb 12 oz (56.133 kg)  BMI 20.91 kg/m2  SpO2 99%  Review of Systems  Constitutional: Negative for fever, chills, appetite change, fatigue and unexpected weight change.  HENT: Negative for ear pain, congestion, sore throat, trouble swallowing, neck pain, voice change and sinus pressure.   Eyes: Negative for visual disturbance.  Respiratory: Negative for cough, shortness of breath,  wheezing and stridor.   Cardiovascular: Negative for chest pain, palpitations and leg swelling.  Gastrointestinal: Negative for nausea, vomiting, abdominal pain, diarrhea, constipation, blood in stool, abdominal distention and anal bleeding.  Genitourinary: Negative for dysuria and flank pain.  Musculoskeletal: Negative for myalgias, arthralgias and gait problem.  Skin: Negative for color change and rash.  Neurological: Negative for dizziness and headaches.  Hematological: Negative for adenopathy. Does not bruise/bleed easily.  Psychiatric/Behavioral: Positive for dysphoric mood. Negative for suicidal ideas and sleep disturbance. The patient is nervous/anxious.        Objective:   Physical Exam  Constitutional: She is oriented to person, place, and time. She appears well-developed and well-nourished. No distress.  HENT:  Head: Normocephalic and atraumatic.  Right Ear: External ear normal.  Left Ear: External ear normal.  Nose: Nose normal.  Mouth/Throat: Oropharynx is clear and moist. No oropharyngeal exudate.  Eyes: Conjunctivae normal are normal. Pupils are equal, round, and reactive to light. Right eye exhibits no discharge. Left eye exhibits no discharge. No scleral icterus.  Neck: Normal range of motion. Neck supple. No tracheal deviation present. No thyromegaly present.  Cardiovascular: Normal rate, regular rhythm, normal heart sounds and intact distal pulses.  Exam reveals no gallop and no friction rub.   No murmur heard. Pulmonary/Chest: Effort normal and breath sounds normal. No respiratory distress. She has no wheezes. She has no rales. She exhibits no tenderness.  Musculoskeletal: Normal range of motion. She exhibits no  edema and no tenderness.  Lymphadenopathy:    She has no cervical adenopathy.  Neurological: She is alert and oriented to person, place, and time. No cranial nerve deficit. She exhibits normal muscle tone. Coordination normal.  Skin: Skin is warm and dry. No  rash noted. She is not diaphoretic. No erythema. No pallor.  Psychiatric: Her speech is normal and behavior is normal. Judgment and thought content normal. Cognition and memory are normal. She exhibits a depressed mood.          Assessment & Plan:

## 2012-08-20 NOTE — Assessment & Plan Note (Signed)
Symptoms of depression/bereavement persistent after son's recent death. Offered support today. Encouraged her to continue with weekly counseling sessions with Dr. Delorse Lek. Will extent FMLA through 08/31/2012. Follow up next week.

## 2012-08-20 NOTE — Assessment & Plan Note (Signed)
General exam is normal. No history of problems with anesthesia or history of easy bleeding/bruising.  Based on modified risk index, pt would be low risk of perioperative cardiac events. Sent letter recommending to proceed with scheduled colonoscopy and endoscopy.

## 2012-08-20 NOTE — Telephone Encounter (Signed)
Faxed 08/20/12/[facility]

## 2012-08-21 ENCOUNTER — Ambulatory Visit: Payer: Self-pay | Admitting: Gastroenterology

## 2012-08-21 ENCOUNTER — Telehealth: Payer: Self-pay | Admitting: General Practice

## 2012-08-21 NOTE — Telephone Encounter (Signed)
Pt was calling to see if you had faxed over the dictation from yesterday's appt to [facility]. Fax number [phone].

## 2012-08-21 NOTE — Telephone Encounter (Signed)
I faxed over a letter. Can you also fax my note from yesterday to the number below?

## 2012-08-21 NOTE — Telephone Encounter (Signed)
Notes from the appt yesterday faxed.

## 2012-08-24 ENCOUNTER — Encounter: Payer: Self-pay | Admitting: General Practice

## 2012-08-24 LAB — HM COLONOSCOPY: HM Colonoscopy: NORMAL

## 2012-08-25 LAB — PATHOLOGY REPORT

## 2012-08-26 ENCOUNTER — Encounter: Payer: Self-pay | Admitting: Internal Medicine

## 2012-08-26 ENCOUNTER — Ambulatory Visit (INDEPENDENT_AMBULATORY_CARE_PROVIDER_SITE_OTHER): Payer: PRIVATE HEALTH INSURANCE | Admitting: Internal Medicine

## 2012-08-26 VITALS — BP 132/78 | HR 84 | Temp 98.1°F | Resp 16 | Wt 121.0 lb

## 2012-08-26 DIAGNOSIS — F419 Anxiety disorder, unspecified: Secondary | ICD-10-CM

## 2012-08-26 DIAGNOSIS — F411 Generalized anxiety disorder: Secondary | ICD-10-CM

## 2012-08-26 DIAGNOSIS — K219 Gastro-esophageal reflux disease without esophagitis: Secondary | ICD-10-CM

## 2012-08-26 DIAGNOSIS — F988 Other specified behavioral and emotional disorders with onset usually occurring in childhood and adolescence: Secondary | ICD-10-CM

## 2012-08-26 MED ORDER — ALPRAZOLAM 0.25 MG PO TABS: 0.2500 mg | ORAL_TABLET | Freq: Two times a day (BID) | ORAL | Status: DC | PRN

## 2012-08-26 MED ORDER — AMPHETAMINE-DEXTROAMPHETAMINE 10 MG PO TABS: 10.0000 mg | ORAL_TABLET | Freq: Two times a day (BID) | ORAL | Status: DC

## 2012-08-26 NOTE — Assessment & Plan Note (Signed)
Symptoms improved on current medications. Will request records on recent EGD. Followup one month.

## 2012-08-26 NOTE — Progress Notes (Signed)
Subjective:    Patient ID: Dana Benson, female    DOB: 08-03-1961, 51 y.o.   MRN: [id number]  HPI 51 year old female with history of ADHD, GERD, and recent depression and anxiety related to unexpected death of her son presents for followup. She reports that last week she underwent upper endoscopy which showed inflammation in her stomach. She was changed from Prilosec to Protonix. She reports some improvement with this change with the addition of Carafate. She denies any current abdominal pain, nausea, change in bowel habits, blood in her stool, or black stool.  In regards to depression and anxiety she reports she continues to use Clonopin at bedtime to help with sleep. She also continues on Prozac. She is planning to go back to work next week full-time. She has been on a leave of abscence after her son's unexpected death. She continues with counseling.  Outpatient Encounter Prescriptions as of 08/26/2012  Medication Sig Dispense Refill  . FLUoxetine (PROZAC) 40 MG capsule Take 1 capsule (40 mg total) by mouth daily.  90 capsule  3  . hyoscyamine (LEVBID) 0.375 MG 12 hr tablet Take 0.375 mg by mouth every 12 (twelve) hours as needed.      . pantoprazole (PROTONIX) 40 MG tablet Take 40 mg by mouth daily.      . sucralfate (CARAFATE) 1 G tablet Take 1 g by mouth 4 (four) times daily.      Marland Kitchen ALPRAZolam (XANAX) 0.25 MG tablet Take 1 tablet (0.25 mg total) by mouth 2 (two) times daily as needed (during daytime for anxiety).  60 tablet  3  . amphetamine-dextroamphetamine (ADDERALL) 10 MG tablet Take 1 tablet (10 mg total) by mouth 2 (two) times daily.  60 tablet  0  . clonazePAM (KLONOPIN) 0.5 MG tablet Take 2 tablets (1 mg total) by mouth 3 (three) times daily as needed for anxiety.  90 tablet  3  . [DISCONTINUED] amphetamine-dextroamphetamine (ADDERALL) 10 MG tablet Take 1 tablet (10 mg total) by mouth 2 (two) times daily.  60 tablet  0  . [DISCONTINUED] omeprazole (PRILOSEC) 20 MG capsule Take 20  mg by mouth daily.       BP 132/78  Pulse 84  Temp 98.1 F (36.7 C) (Oral)  Resp 16  Wt 121 lb (54.885 kg)  Review of Systems  Constitutional: Negative for fever, chills, appetite change, fatigue and unexpected weight change.  HENT: Negative for ear pain, congestion, sore throat, trouble swallowing, neck pain, voice change and sinus pressure.   Eyes: Negative for visual disturbance.  Respiratory: Negative for cough, shortness of breath, wheezing and stridor.   Cardiovascular: Negative for chest pain, palpitations and leg swelling.  Gastrointestinal: Negative for nausea, vomiting, abdominal pain, diarrhea, constipation, blood in stool, abdominal distention and anal bleeding.  Genitourinary: Negative for dysuria and flank pain.  Musculoskeletal: Negative for myalgias, arthralgias and gait problem.  Skin: Negative for color change and rash.  Neurological: Negative for dizziness and headaches.  Hematological: Negative for adenopathy. Does not bruise/bleed easily.  Psychiatric/Behavioral: Positive for dysphoric mood and decreased concentration. Negative for suicidal ideas and sleep disturbance. The patient is nervous/anxious.        Objective:   Physical Exam  Constitutional: She is oriented to person, place, and time. She appears well-developed and well-nourished. No distress.  HENT:  Head: Normocephalic and atraumatic.  Right Ear: External ear normal.  Left Ear: External ear normal.  Nose: Nose normal.  Mouth/Throat: Oropharynx is clear and moist. No oropharyngeal exudate.  Eyes:  Conjunctivae normal are normal. Pupils are equal, round, and reactive to light. Right eye exhibits no discharge. Left eye exhibits no discharge. No scleral icterus.  Neck: Normal range of motion. Neck supple. No tracheal deviation present. No thyromegaly present.  Cardiovascular: Normal rate, regular rhythm, normal heart sounds and intact distal pulses.  Exam reveals no gallop and no friction rub.   No  murmur heard. Pulmonary/Chest: Effort normal and breath sounds normal. No respiratory distress. She has no wheezes. She has no rales. She exhibits no tenderness.  Abdominal: Soft. Bowel sounds are normal. She exhibits no distension. There is no tenderness.  Musculoskeletal: Normal range of motion. She exhibits no edema and no tenderness.  Lymphadenopathy:    She has no cervical adenopathy.  Neurological: She is alert and oriented to person, place, and time. No cranial nerve deficit. She exhibits normal muscle tone. Coordination normal.  Skin: Skin is warm and dry. No rash noted. She is not diaphoretic. No erythema. No pallor.  Psychiatric: She has a normal mood and affect. Her behavior is normal. Judgment and thought content normal.          Assessment & Plan:

## 2012-08-26 NOTE — Assessment & Plan Note (Signed)
Symptoms of anxiety improving gradually. Encouraged continued counseling with Dr. Delorse Lek. Pt will use Xanax prn severe anxiety, when at work. She understands risk of sedation on this medication. Continue Prozac. Follow up 1 month an dprn.

## 2012-08-26 NOTE — Assessment & Plan Note (Addendum)
Symptoms historically well controlled on Adderall. Will refill today. Patient is planning to start back to work after recent leave of absence after her son's unexpected death. Will continue to monitor. Followup in one month.

## 2012-09-28 ENCOUNTER — Ambulatory Visit: Payer: PRIVATE HEALTH INSURANCE | Admitting: Internal Medicine

## 2012-09-28 ENCOUNTER — Other Ambulatory Visit: Payer: Self-pay | Admitting: Gastroenterology

## 2013-02-08 ENCOUNTER — Ambulatory Visit (INDEPENDENT_AMBULATORY_CARE_PROVIDER_SITE_OTHER): Payer: 59 | Admitting: Internal Medicine

## 2013-02-08 ENCOUNTER — Encounter: Payer: Self-pay | Admitting: Internal Medicine

## 2013-02-08 VITALS — BP 128/82 | HR 76 | Temp 98.6°F | Wt 134.0 lb

## 2013-02-08 DIAGNOSIS — F329 Major depressive disorder, single episode, unspecified: Secondary | ICD-10-CM

## 2013-02-08 DIAGNOSIS — F988 Other specified behavioral and emotional disorders with onset usually occurring in childhood and adolescence: Secondary | ICD-10-CM

## 2013-02-08 DIAGNOSIS — F411 Generalized anxiety disorder: Secondary | ICD-10-CM

## 2013-02-08 DIAGNOSIS — F419 Anxiety disorder, unspecified: Secondary | ICD-10-CM

## 2013-02-08 DIAGNOSIS — F32A Depression, unspecified: Secondary | ICD-10-CM

## 2013-02-08 MED ORDER — AMPHETAMINE-DEXTROAMPHETAMINE 10 MG PO TABS: 10.0000 mg | ORAL_TABLET | Freq: Two times a day (BID) | ORAL | Status: DC

## 2013-02-08 MED ORDER — CLONAZEPAM 0.5 MG PO TABS: 1.0000 mg | ORAL_TABLET | Freq: Three times a day (TID) | ORAL | Status: DC | PRN

## 2013-02-08 NOTE — Assessment & Plan Note (Signed)
Symptoms well controlled with Klonopin. Will continue.

## 2013-02-08 NOTE — Progress Notes (Signed)
Subjective:    Patient ID: Dana Benson, female    DOB: Aug 15, 1961, 52 y.o.   MRN: [id number]  HPI 52 year old female with history of ADHD, anxiety, depression presents for followup. 6 months ago her son died suddenly. She has been dealing with bereavement and increased since that time. She reports she is back to work although continues to have difficulty focusing at work and feels that her work environment is very confining. 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 She has stopped her antidepressant medication, fluoxetine. She prefers not to use medications to help with depression. She continues on Adderall to help with symptoms of ADD. She reports good control of the symptoms. She denies any noted side effects from the medication. She continues to occasionally use Clonopin at bedtime to help with sleep. She also occasionally uses it during the day if significant episode of anxiety or panic.  Outpatient Encounter Prescriptions as of 02/08/2013  Medication Sig Dispense Refill  . ALPRAZolam (XANAX) 0.25 MG tablet Take 1 tablet (0.25 mg total) by mouth 2 (two) times daily as needed (during daytime for anxiety).  60 tablet  3  . amphetamine-dextroamphetamine (ADDERALL) 10 MG tablet Take 1 tablet (10 mg total) by mouth 2 (two) times daily.  60 tablet  0  . clonazePAM (KLONOPIN) 0.5 MG tablet Take 2 tablets (1 mg total) by mouth 3 (three) times daily as needed for anxiety.  90 tablet  3   No facility-administered encounter medications on file as of 02/08/2013.   BP 128/82  Pulse 76  Temp(Src) 98.6 F (37 C) (Oral)  Wt 134 lb (60.782 kg)  BMI 22.65 kg/m2  SpO2 99%  Review of Systems  Constitutional: Negative for fever, chills, appetite change, fatigue and unexpected weight change.  HENT: Negative for ear pain, congestion, sore throat, trouble swallowing, neck pain, voice change and sinus pressure.   Eyes: Negative for visual disturbance.  Respiratory: Negative  for cough, shortness of breath, wheezing and stridor.   Cardiovascular: Negative for chest pain, palpitations and leg swelling.  Gastrointestinal: Negative for nausea, vomiting, abdominal pain, diarrhea, constipation, blood in stool, abdominal distention and anal bleeding.  Genitourinary: Negative for dysuria and flank pain.  Musculoskeletal: Negative for myalgias, arthralgias and gait problem.  Skin: Negative for color change and rash.  Neurological: Negative for dizziness and headaches.  Hematological: Negative for adenopathy. Does not bruise/bleed easily.  Psychiatric/Behavioral: Positive for sleep disturbance, dysphoric mood and decreased concentration. Negative for suicidal ideas. The patient is nervous/anxious.        Objective:   Physical Exam  Constitutional: She is oriented to person, place, and time. She appears well-developed and well-nourished. No distress.  HENT:  Head: Normocephalic and atraumatic.  Right Ear: External ear normal.  Left Ear: External ear normal.  Nose: Nose normal.  Mouth/Throat: Oropharynx is clear and moist. No oropharyngeal exudate.  Eyes: Conjunctivae are normal. Pupils are equal, round, and reactive to light. Right eye exhibits no discharge. Left eye exhibits no discharge. No scleral icterus.  Neck: Normal range of motion. Neck supple. No tracheal deviation present. No thyromegaly present.  Cardiovascular: Normal rate, regular rhythm, normal heart sounds and intact distal pulses.  Exam reveals no gallop and no friction rub.   No murmur heard. Pulmonary/Chest: Effort normal and breath sounds normal. No accessory muscle usage. Not tachypneic. No respiratory distress. She has no decreased breath sounds. She has no wheezes. She has no rhonchi. She has no rales. She exhibits  no tenderness.  Musculoskeletal: Normal range of motion. She exhibits no edema and no tenderness.  Lymphadenopathy:    She has no cervical adenopathy.  Neurological: She is alert and  oriented to person, place, and time. No cranial nerve deficit. She exhibits normal muscle tone. Coordination normal.  Skin: Skin is warm and dry. No rash noted. She is not diaphoretic. No erythema. No pallor.  Psychiatric: Her speech is normal and behavior is normal. Judgment and thought content normal. Her mood appears anxious. She exhibits a depressed mood.          Assessment & Plan:

## 2013-02-08 NOTE — Assessment & Plan Note (Signed)
Symptoms well controlled with Adderall. Will continue.

## 2013-02-08 NOTE — Assessment & Plan Note (Signed)
Symptoms gradually improving despite stopping fluoxetine. Discussed alternative treatments for depression including cognitive behavioral therapy. Patient would prefer to hold off at this time. Continue to monitor. Followup in 3 months or sooner as needed.

## 2013-02-11 ENCOUNTER — Telehealth: Payer: Self-pay | Admitting: *Deleted

## 2013-02-11 NOTE — Telephone Encounter (Signed)
Received a fax from [employer], Amphetamine tab 10 mg has been APPROVED   04.14.2014 -[phone]

## 2013-02-26 ENCOUNTER — Encounter: Payer: Self-pay | Admitting: Internal Medicine

## 2013-02-26 ENCOUNTER — Encounter: Payer: Self-pay | Admitting: Gastroenterology

## 2013-03-11 ENCOUNTER — Telehealth: Payer: Self-pay | Admitting: Internal Medicine

## 2013-03-11 DIAGNOSIS — F988 Other specified behavioral and emotional disorders with onset usually occurring in childhood and adolescence: Secondary | ICD-10-CM

## 2013-03-11 MED ORDER — AMPHETAMINE-DEXTROAMPHETAMINE 10 MG PO TABS: 10.0000 mg | ORAL_TABLET | Freq: Two times a day (BID) | ORAL | Status: DC

## 2013-03-11 NOTE — Telephone Encounter (Signed)
OK. We will seen to set up formal evaluation for ADD, as these are considered high risk medications and we have to have documentation of testing.  (even though meds were initially started by another physician) I will put in order for referral.

## 2013-03-11 NOTE — Telephone Encounter (Signed)
Patient has never been tested for ADD, but will be here tomorrow to pick up her prescription

## 2013-03-11 NOTE — Telephone Encounter (Signed)
Patient needs her adderall prescription. Please call her on cell when this prescription is ready for pick up.

## 2013-03-11 NOTE — Telephone Encounter (Signed)
Patient is due refill, last refill and OV was on 5/12 #60

## 2013-03-11 NOTE — Telephone Encounter (Signed)
Left message to call back  

## 2013-03-11 NOTE — Telephone Encounter (Signed)
Fine to refill x 1 month. Can you ask her where testing for ADD was performed in the past? We will need to have a copy of these records in her chart.

## 2013-03-12 NOTE — Telephone Encounter (Signed)
Left message on patient voicemail.

## 2013-04-07 IMAGING — RF DG BARIUM SWALLOW
2 series · 8 of 8 positions shown · non-contrast
Comparison: none

REASON FOR EXAM: Dysphagia
COMMENTS:

PROCEDURE:     FL  - FL BARIUM SWALLOW  - August 11, 2012  [DATE]
RESULT:     Comparison: None.
TECHNIQUE: Standard double contrast barium swallow was performed with
monitoring by intermittent fluoroscopy.

[Series 1: fluoro_barium 2fps_bw · 0.17mm/px · 4 of 19 frames shown (1 of 2)]
[frame 3/19]
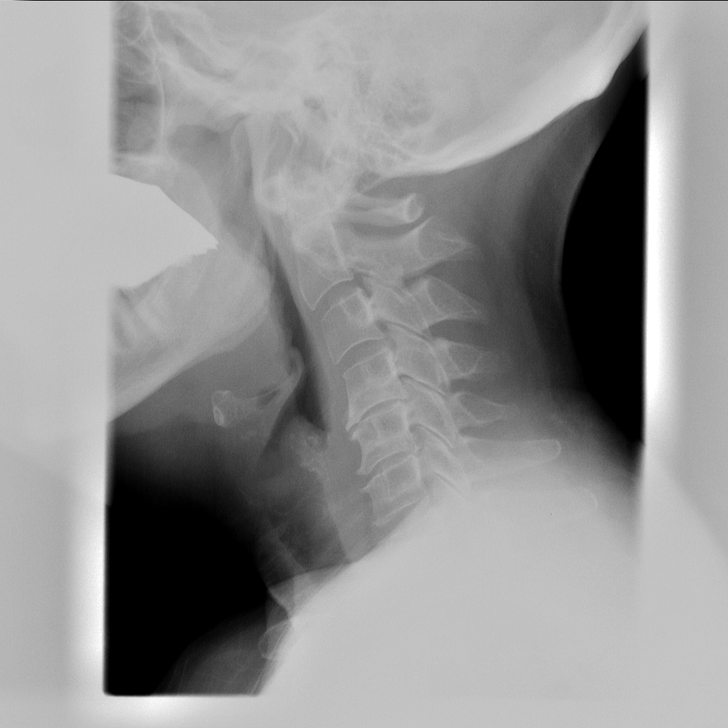
[frame 9/19]
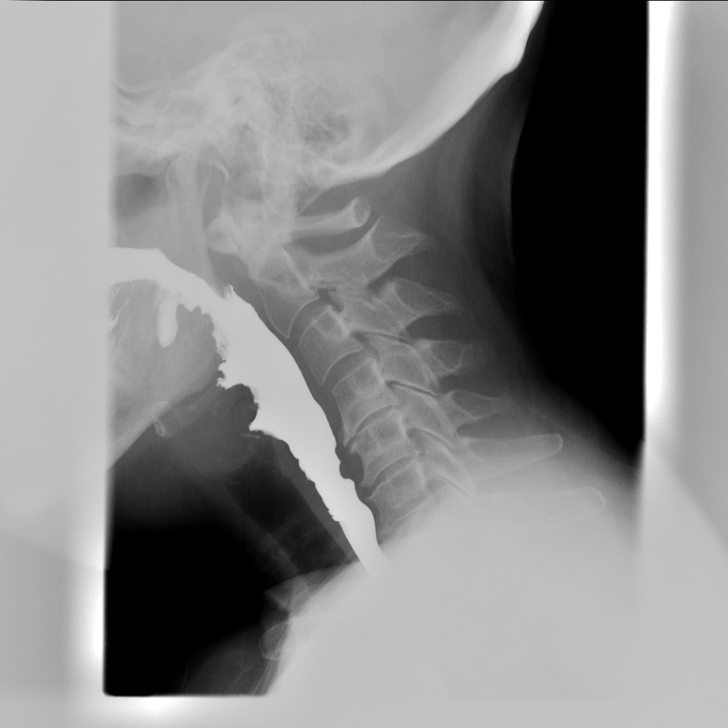
[frame 10/19]
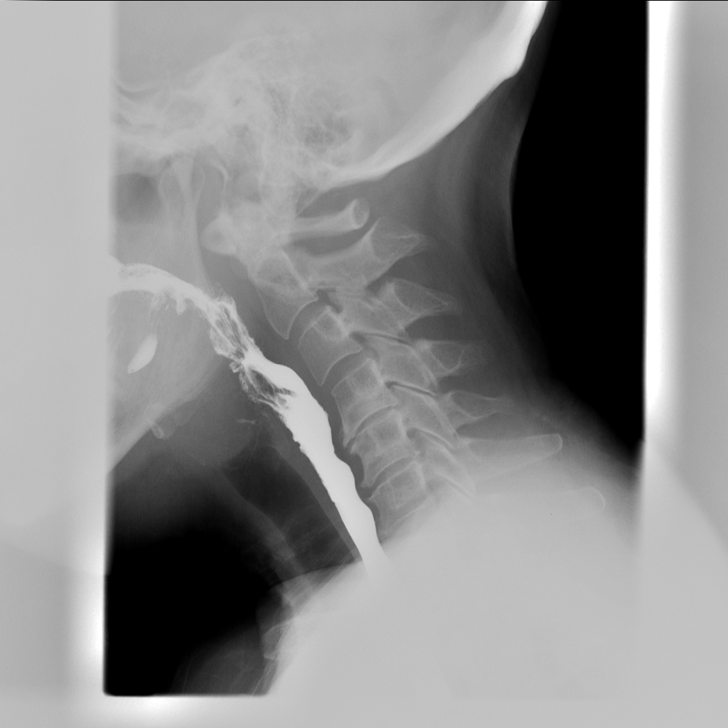
[frame 17/19]
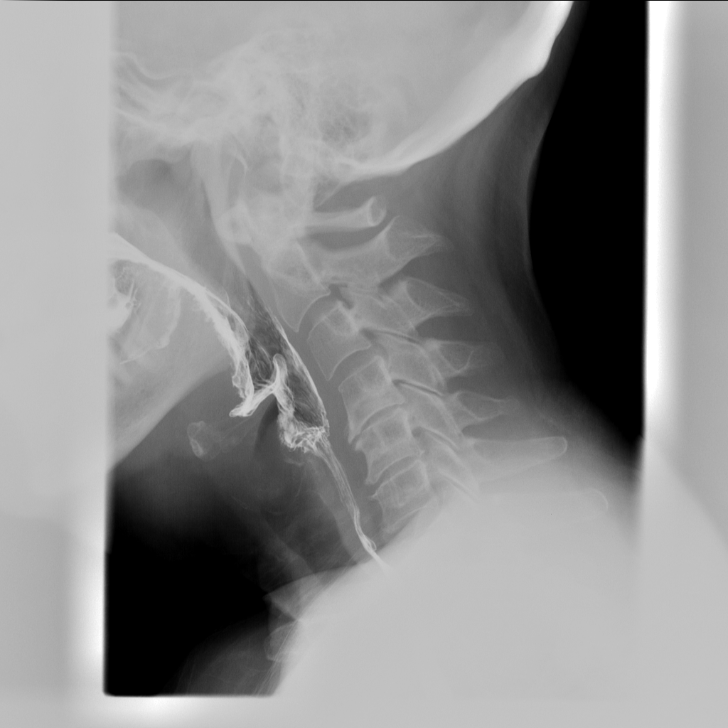

[Series 2: fluoro_barium 2fps_bw · 0.17mm/px · 4 of 18 frames shown (2 of 2)]
[frame 3/18]
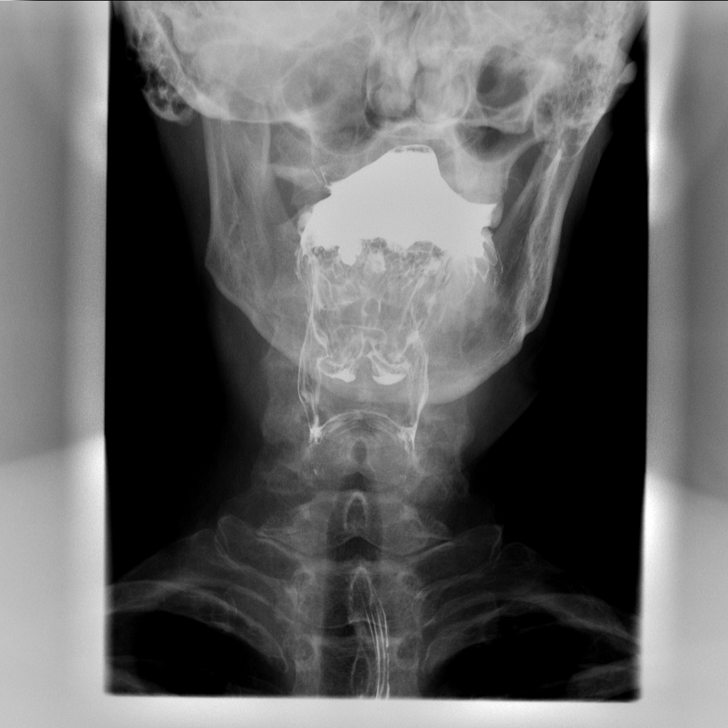
[frame 5/18]
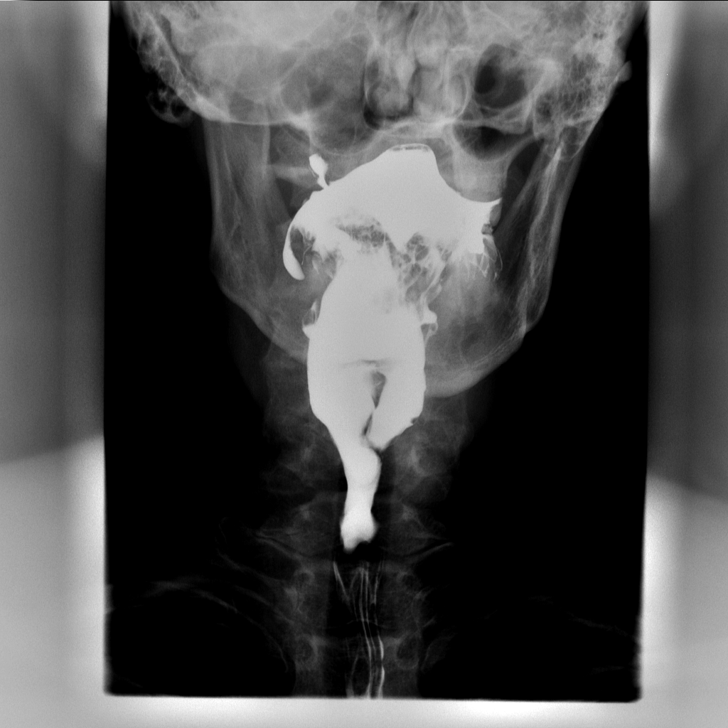
[frame 10/18]
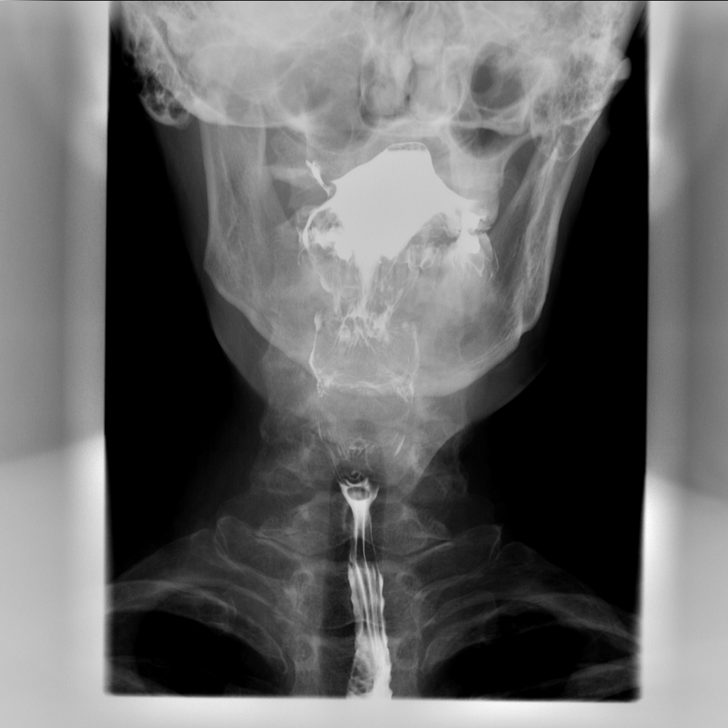
[frame 16/18]
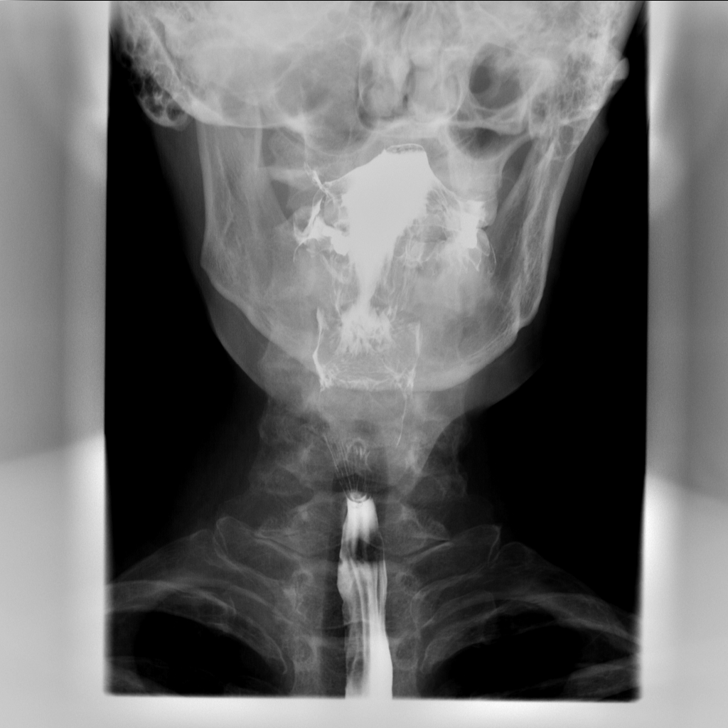

[8 of 8 positions shown; findings below may reference images not displayed]

FINDINGS: There is normal passage of barium contrast material through the hypopharynx
and into the cervical esophagus. No penetration or aspiration. Degenerative
disc disease is seen in the mid and lower cervical spine.

The thoracic esophagus was normal in caliber. No intraluminal filling defect
or ulceration identified. There is normal esophageal peristalsis. The
gastroesophageal junction was normal in caliber. No gastroesophageal reflux
seen during examination.

The patient swallowed a 13 mm barium tablet which passed freely into the
stomach.
IMPRESSION: Unremarkable barium swallow.

## 2013-04-14 ENCOUNTER — Telehealth: Payer: Self-pay | Admitting: Internal Medicine

## 2013-04-14 DIAGNOSIS — F988 Other specified behavioral and emotional disorders with onset usually occurring in childhood and adolescence: Secondary | ICD-10-CM

## 2013-04-14 MED ORDER — AMPHETAMINE-DEXTROAMPHETAMINE 10 MG PO TABS: 10.0000 mg | ORAL_TABLET | Freq: Two times a day (BID) | ORAL | Status: DC

## 2013-04-14 NOTE — Telephone Encounter (Signed)
Pt notified Rx ready for pickup 

## 2013-04-14 NOTE — Telephone Encounter (Signed)
Last refill was on 6/12 for #60, taking BID. Patient is due for refill, could you please refill medication?

## 2013-04-14 NOTE — Telephone Encounter (Signed)
You have to print it out, I'm not in the office.

## 2013-04-14 NOTE — Telephone Encounter (Signed)
yes

## 2013-04-14 NOTE — Telephone Encounter (Signed)
Pt is needing refill on Adderall °

## 2013-04-14 NOTE — Telephone Encounter (Signed)
Picky, picky!

## 2013-05-25 ENCOUNTER — Encounter: Payer: Self-pay | Admitting: *Deleted

## 2013-05-26 ENCOUNTER — Encounter: Payer: Self-pay | Admitting: Internal Medicine

## 2013-05-26 ENCOUNTER — Encounter: Payer: Self-pay | Admitting: *Deleted

## 2013-05-26 ENCOUNTER — Ambulatory Visit (INDEPENDENT_AMBULATORY_CARE_PROVIDER_SITE_OTHER): Payer: 59 | Admitting: Internal Medicine

## 2013-05-26 VITALS — BP 128/80 | HR 82 | Temp 98.3°F | Ht 64.5 in | Wt 130.0 lb

## 2013-05-26 DIAGNOSIS — F988 Other specified behavioral and emotional disorders with onset usually occurring in childhood and adolescence: Secondary | ICD-10-CM

## 2013-05-26 DIAGNOSIS — Z0000 Encounter for general adult medical examination without abnormal findings: Secondary | ICD-10-CM

## 2013-05-26 DIAGNOSIS — E785 Hyperlipidemia, unspecified: Secondary | ICD-10-CM

## 2013-05-26 DIAGNOSIS — R5381 Other malaise: Secondary | ICD-10-CM | POA: Insufficient documentation

## 2013-05-26 MED ORDER — AMPHETAMINE-DEXTROAMPHETAMINE 10 MG PO TABS: 10.0000 mg | ORAL_TABLET | Freq: Two times a day (BID) | ORAL | Status: DC

## 2013-05-26 MED ORDER — ZOSTER VACCINE LIVE 19400 UNT/0.65ML ~~LOC~~ SOLR: 0.6500 mL | Freq: Once | SUBCUTANEOUS | Status: DC

## 2013-05-26 NOTE — Assessment & Plan Note (Signed)
Recent testing with local psychologist confirmed diagnosis of ADD. Symptoms are well controlled with Adderall. Will continue.

## 2013-05-26 NOTE — Assessment & Plan Note (Signed)
Patient reports symptoms of generalized fatigue. No focal symptoms. Suspect symptoms may be secondary to ongoing issues with anxiety. However, will check CBC, TSH, CMP, B12 with labs. Encouraged her to consider sleep study to evaluate for obstructive sleep apnea if labs are normal. She will consider this.

## 2013-05-26 NOTE — Progress Notes (Signed)
Subjective:    Patient ID: Dana Benson, female    DOB: 1961/04/22, 52 y.o.   MRN: [id number]  HPI 52 year old female presents for annual exam. She reports that she is generally doing well. Symptoms of decreased concentration are well controlled with use of Adderall. She does note some symptoms of ongoing fatigue. This occurs even when taking Adderall. She reports that symptoms of anxiety are generally well controlled with use of clonazepam. She denies any focal symptoms such as chest pain, shortness of breath, change in bowel habits, or change in appetite. She is trying to follow a healthy diet and get regular physical activity. She recently returned from a trip to [place].  Outpatient Encounter Prescriptions as of 05/26/2013  Medication Sig Dispense Refill  . ALPRAZolam (XANAX) 0.25 MG tablet Take 1 tablet (0.25 mg total) by mouth 2 (two) times daily as needed (during daytime for anxiety).  60 tablet  3  . amphetamine-dextroamphetamine (ADDERALL) 10 MG tablet Take 1 tablet (10 mg total) by mouth 2 (two) times daily.  60 tablet  0  . clonazePAM (KLONOPIN) 0.5 MG tablet Take 2 tablets (1 mg total) by mouth 3 (three) times daily as needed for anxiety.  90 tablet  3   No facility-administered encounter medications on file as of 05/26/2013.   BP 128/80  Pulse 82  Temp(Src) 98.3 F (36.8 C) (Oral)  Ht 5' 4.5" (1.638 m)  Wt 130 lb (58.968 kg)  BMI 21.98 kg/m2  SpO2 97%  Review of Systems  Constitutional: Positive for fatigue. Negative for fever, chills, appetite change and unexpected weight change.  HENT: Negative for ear pain, congestion, sore throat, trouble swallowing, neck pain, voice change and sinus pressure.   Eyes: Negative for visual disturbance.  Respiratory: Negative for cough, shortness of breath, wheezing and stridor.   Cardiovascular: Negative for chest pain, palpitations and leg swelling.  Gastrointestinal: Negative for nausea, vomiting, abdominal pain, diarrhea,  constipation, blood in stool, abdominal distention and anal bleeding.  Genitourinary: Negative for dysuria and flank pain.  Musculoskeletal: Negative for myalgias, arthralgias and gait problem.  Skin: Negative for color change and rash.  Neurological: Negative for dizziness and headaches.  Hematological: Negative for adenopathy. Does not bruise/bleed easily.  Psychiatric/Behavioral: Positive for decreased concentration. Negative for suicidal ideas, sleep disturbance and dysphoric mood. The patient is not nervous/anxious.        Objective:   Physical Exam  Constitutional: She is oriented to person, place, and time. She appears well-developed and well-nourished. No distress.  HENT:  Head: Normocephalic and atraumatic.  Right Ear: External ear normal.  Left Ear: External ear normal.  Nose: Nose normal.  Mouth/Throat: Oropharynx is clear and moist. No oropharyngeal exudate.  Eyes: Conjunctivae are normal. Pupils are equal, round, and reactive to light. Right eye exhibits no discharge. Left eye exhibits no discharge. No scleral icterus.  Neck: Normal range of motion. Neck supple. No tracheal deviation present. No thyromegaly present.  Cardiovascular: Normal rate, regular rhythm, normal heart sounds and intact distal pulses.  Exam reveals no gallop and no friction rub.   No murmur heard. Pulmonary/Chest: Effort normal and breath sounds normal. No accessory muscle usage. Not tachypneic. No respiratory distress. She has no decreased breath sounds. She has no wheezes. She has no rales. She exhibits no tenderness. Right breast exhibits no inverted nipple, no mass, no nipple discharge, no skin change and no tenderness. Left breast exhibits no inverted nipple, no mass, no nipple discharge, no skin change and no tenderness.  Breasts are symmetrical.  Abdominal: Soft. Bowel sounds are normal. She exhibits no distension and no mass. There is no tenderness. There is no rebound and no guarding.   Musculoskeletal: Normal range of motion. She exhibits no edema and no tenderness.  Lymphadenopathy:    She has no cervical adenopathy.  Neurological: She is alert and oriented to person, place, and time. No cranial nerve deficit. She exhibits normal muscle tone. Coordination normal.  Skin: Skin is warm and dry. No rash noted. She is not diaphoretic. No erythema. No pallor.  Psychiatric: She has a normal mood and affect. Her behavior is normal. Judgment and thought content normal.          Assessment & Plan:

## 2013-05-26 NOTE — Assessment & Plan Note (Signed)
General medical exam including breast exam normal today. Pap and pelvic deferred as UTD, normal PAP 2012 and pt s/p hysterectomy. Mammogram has been ordered. Strongly encouraged smoking cessation. Will check labs today including CBC, CMP, lipid profile, TSH, B12. Encouraged healthy diet and regular physical activity.

## 2013-05-27 LAB — LIPID PANEL
HDL: 66.1 mg/dL (ref >39.00)
Total CHOL/HDL Ratio: 4
Triglycerides: 120 mg/dL (ref 0.0–149.0)

## 2013-05-27 LAB — COMPREHENSIVE METABOLIC PANEL
ALT: 16 U/L (ref 0–35)
AST: 18 U/L (ref 0–37)
Albumin: 4.2 g/dL (ref 3.5–5.2)
Alkaline Phosphatase: 97 U/L (ref 39–117)
BUN: 15 mg/dL (ref 6–23)
Creatinine, Ser: 0.9 mg/dL (ref 0.4–1.2)
Potassium: 4.1 mEq/L (ref 3.5–5.1)

## 2013-05-27 LAB — CBC WITH DIFFERENTIAL/PLATELET
Basophils Absolute: 0 10*3/uL (ref 0.0–0.1)
Eosinophils Absolute: 0.2 10*3/uL (ref 0.0–0.7)
Hemoglobin: 13.2 g/dL (ref 12.0–15.0)
Lymphocytes Relative: 31.4 % (ref 12.0–46.0)
MCHC: 34.1 g/dL (ref 30.0–36.0)
Monocytes Relative: 5.5 % (ref 3.0–12.0)
Neutro Abs: 4.7 10*3/uL (ref 1.4–7.7)
Neutrophils Relative %: 59.8 % (ref 43.0–77.0)
RDW: 12.8 % (ref 11.5–14.6)

## 2013-05-27 LAB — VITAMIN B12: Vitamin B-12: 182 pg/mL — ABNORMAL LOW (ref 211–911)

## 2013-05-27 LAB — TSH: TSH: 1.12 u[IU]/mL (ref 0.35–5.50)

## 2013-05-28 LAB — MICROALBUMIN / CREATININE URINE RATIO
Creatinine,U: 40.4 mg/dL
Microalb Creat Ratio: 0.5 mg/g (ref 0.0–30.0)
Microalb, Ur: 0.2 mg/dL (ref 0.0–1.9)

## 2013-05-28 LAB — LDL CHOLESTEROL, DIRECT: Direct LDL: 162.6 mg/dL

## 2013-06-01 ENCOUNTER — Encounter: Payer: Self-pay | Admitting: *Deleted

## 2013-06-01 ENCOUNTER — Telehealth: Payer: Self-pay | Admitting: Internal Medicine

## 2013-06-01 MED ORDER — ATORVASTATIN CALCIUM 20 MG PO TABS: 20.0000 mg | ORAL_TABLET | Freq: Every day | ORAL | Status: DC

## 2013-06-01 NOTE — Telephone Encounter (Signed)
The patient wants the B-12 injection called into [facility] pharmacy. And the prescription for high cholesterol

## 2013-06-01 NOTE — Telephone Encounter (Signed)
Prescription for cholesterol medication sent to the pharmacy, sent patient a message about B-12 before I actually sent it to the pharmacy.

## 2013-06-02 ENCOUNTER — Telehealth: Payer: Self-pay | Admitting: Internal Medicine

## 2013-06-02 MED ORDER — CYANOCOBALAMIN 1000 MCG/ML IJ SOLN: INTRAMUSCULAR | Status: DC

## 2013-06-02 NOTE — Telephone Encounter (Signed)
Pt calling to state she will be getting her B12 injections at the employee health.  Pt states she will pick the Rx up at the pharmacy, [facility] and take to employee health to have administered.  Call pt at work# today if needed.

## 2013-06-02 NOTE — Telephone Encounter (Signed)
Prescription sent to the pharmacy.

## 2013-06-24 ENCOUNTER — Telehealth: Payer: Self-pay | Admitting: Internal Medicine

## 2013-06-24 DIAGNOSIS — F988 Other specified behavioral and emotional disorders with onset usually occurring in childhood and adolescence: Secondary | ICD-10-CM

## 2013-06-24 NOTE — Telephone Encounter (Signed)
Patient aware prescription will be ready for pick up tomorrow.

## 2013-06-24 NOTE — Telephone Encounter (Signed)
Patient needs her Aderall prescription so she can pick up please call her at [phone] when it is ready

## 2013-06-24 NOTE — Telephone Encounter (Signed)
Fwd to Dr. Dan Humphreys,  Last refill was at her [facility] on 8/27

## 2013-06-24 NOTE — Telephone Encounter (Signed)
Fine to print refill, however pharmacy will not fill until 1 month interval complete, so 9/27

## 2013-06-25 MED ORDER — AMPHETAMINE-DEXTROAMPHETAMINE 10 MG PO TABS: 10.0000 mg | ORAL_TABLET | Freq: Two times a day (BID) | ORAL | Status: DC

## 2013-07-27 ENCOUNTER — Telehealth: Payer: Self-pay | Admitting: Internal Medicine

## 2013-07-27 DIAGNOSIS — F988 Other specified behavioral and emotional disorders with onset usually occurring in childhood and adolescence: Secondary | ICD-10-CM

## 2013-07-27 NOTE — Telephone Encounter (Signed)
amphetamine-dextroamphetamine (ADDERALL) 10 MG tablet

## 2013-07-27 NOTE — Telephone Encounter (Signed)
Last refill was on 9/26 for #60

## 2013-07-27 NOTE — Telephone Encounter (Signed)
Fine to refill. We will need to confirm that she has had testing for ADD with psychiatry. New state requirement per Dr. Maryruth Bun

## 2013-07-28 NOTE — Telephone Encounter (Signed)
Left message to call back  

## 2013-07-29 MED ORDER — AMPHETAMINE-DEXTROAMPHETAMINE 10 MG PO TABS: 10.0000 mg | ORAL_TABLET | Freq: Two times a day (BID) | ORAL | Status: DC

## 2013-07-29 NOTE — Telephone Encounter (Signed)
Spoke with patient, she has had the testing done and it has been scanned into her chart. Prescription ready for patient to pick up at front.

## 2013-07-29 NOTE — Telephone Encounter (Signed)
Left message to call back  

## 2013-08-02 ENCOUNTER — Ambulatory Visit: Payer: Self-pay | Admitting: Internal Medicine

## 2013-08-05 ENCOUNTER — Other Ambulatory Visit: Payer: Self-pay

## 2013-08-13 ENCOUNTER — Encounter: Payer: Self-pay | Admitting: Internal Medicine

## 2013-08-24 ENCOUNTER — Telehealth: Payer: Self-pay | Admitting: Internal Medicine

## 2013-08-24 DIAGNOSIS — F988 Other specified behavioral and emotional disorders with onset usually occurring in childhood and adolescence: Secondary | ICD-10-CM

## 2013-08-24 MED ORDER — AMPHETAMINE-DEXTROAMPHETAMINE 10 MG PO TABS: 10.0000 mg | ORAL_TABLET | Freq: Two times a day (BID) | ORAL | Status: DC

## 2013-08-24 NOTE — Telephone Encounter (Signed)
Fwd to Dr. Walker 

## 2013-08-24 NOTE — Telephone Encounter (Signed)
Rx printed

## 2013-08-24 NOTE — Telephone Encounter (Signed)
Adderall refill needed.  Since 11/30 (date she thinks refill is due) falls on weekend she is asking if she can pick it up tomorrow and if we can date the refill for Friday.

## 2013-08-25 NOTE — Telephone Encounter (Signed)
Patient informed prescription is ready to be picked up.

## 2013-08-30 ENCOUNTER — Encounter: Payer: Self-pay | Admitting: *Deleted

## 2013-08-31 ENCOUNTER — Encounter: Payer: Self-pay | Admitting: Internal Medicine

## 2013-08-31 ENCOUNTER — Ambulatory Visit (INDEPENDENT_AMBULATORY_CARE_PROVIDER_SITE_OTHER): Payer: 59 | Admitting: Internal Medicine

## 2013-08-31 VITALS — BP 130/88 | HR 70 | Temp 97.6°F | Wt 132.0 lb

## 2013-08-31 DIAGNOSIS — F988 Other specified behavioral and emotional disorders with onset usually occurring in childhood and adolescence: Secondary | ICD-10-CM

## 2013-08-31 DIAGNOSIS — D510 Vitamin B12 deficiency anemia due to intrinsic factor deficiency: Secondary | ICD-10-CM | POA: Insufficient documentation

## 2013-08-31 DIAGNOSIS — K589 Irritable bowel syndrome without diarrhea: Secondary | ICD-10-CM | POA: Insufficient documentation

## 2013-08-31 DIAGNOSIS — K5900 Constipation, unspecified: Secondary | ICD-10-CM

## 2013-08-31 DIAGNOSIS — F32A Depression, unspecified: Secondary | ICD-10-CM

## 2013-08-31 DIAGNOSIS — F329 Major depressive disorder, single episode, unspecified: Secondary | ICD-10-CM

## 2013-08-31 DIAGNOSIS — K5909 Other constipation: Secondary | ICD-10-CM

## 2013-08-31 MED ORDER — AMPHETAMINE-DEXTROAMPHETAMINE 10 MG PO TABS: 10.0000 mg | ORAL_TABLET | Freq: Two times a day (BID) | ORAL | Status: DC

## 2013-08-31 MED ORDER — CLONAZEPAM 0.5 MG PO TABS: 1.0000 mg | ORAL_TABLET | Freq: Three times a day (TID) | ORAL | Status: DC | PRN

## 2013-08-31 MED ORDER — HYOSCYAMINE SULFATE 0.125 MG SL SUBL: 0.1250 mg | SUBLINGUAL_TABLET | SUBLINGUAL | Status: DC | PRN

## 2013-08-31 NOTE — Progress Notes (Signed)
Pre-visit discussion using our clinic review tool. No additional management support is needed unless otherwise documented below in the visit note.  

## 2013-08-31 NOTE — Progress Notes (Signed)
Subjective:    Patient ID: Dana Benson, female    DOB: 10/06/1960, 52 y.o.   MRN: [id number]  HPI 52 year old female with history of anxiety, depression, ADD, and irritable bowel syndrome presents for followup. In regards to anxiety and depression, she reports that symptoms have been fairly well-controlled with use of clonazepam as needed. She continues to have episodes of depressed mood. She has completed counseling with local psychologist with minimal improvement. She is not interested in continuing this further. She has been intolerant to SSRI medications. She continues degrees for the loss of her son. The holidays are difficult time for her. She reports adequate support from friends and family members. No suicidal ideation.  In regards to ADD, symptoms have been well controlled on Adderall. She is functioning well at work. No side effects noted from the medication.  She is concerned about chronic symptoms of irritable bowel. She has constipation alternating with diarrhea. Typically, during the week she has constipation and then on the weekends she has abdominal distention, crampy abdominal pain, and diarrhea. She had recent GI evaluation with colonoscopy which was normal. She has tried probiotics with minimal improvement. She sometimes takes laxatives such as MiraLax to help with constipation. NO persistent abdominal pain. No blood noted in stool.   Outpatient Prescriptions Prior to Visit  Medication Sig Dispense Refill  . ALPRAZolam (XANAX) 0.25 MG tablet Take 1 tablet (0.25 mg total) by mouth 2 (two) times daily as needed (during daytime for anxiety).  60 tablet  3  . cyanocobalamin (,VITAMIN B-12,) 1000 MCG/ML injection Inject 1 mL (1000 mcg) into muscle once a week for 3 weeks then once a month.  3 mL  6  . amphetamine-dextroamphetamine (ADDERALL) 10 MG tablet Take 1 tablet (10 mg total) by mouth 2 (two) times daily.  60 tablet  0  . clonazePAM (KLONOPIN) 0.5 MG tablet Take 2 tablets (1  mg total) by mouth 3 (three) times daily as needed for anxiety.  90 tablet  3  . atorvastatin (LIPITOR) 20 MG tablet Take 1 tablet (20 mg total) by mouth daily.  30 tablet  3  . zoster vaccine live, PF, (ZOSTAVAX) 29528 UNT/0.65ML injection Inject 19,400 Units into the skin once.  1 each  0   No facility-administered medications prior to visit.   BP 130/88  Pulse 70  Temp(Src) 97.6 F (36.4 C) (Oral)  Wt 132 lb (59.875 kg)  SpO2 99%  Review of Systems  Constitutional: Negative for fever, chills, appetite change, fatigue and unexpected weight change.  HENT: Negative for congestion, ear pain, sinus pressure, sore throat, trouble swallowing and voice change.   Eyes: Negative for visual disturbance.  Respiratory: Negative for cough, shortness of breath, wheezing and stridor.   Cardiovascular: Negative for chest pain, palpitations and leg swelling.  Gastrointestinal: Positive for abdominal pain, diarrhea, constipation and abdominal distention. Negative for nausea, vomiting, blood in stool and anal bleeding.  Genitourinary: Negative for dysuria and flank pain.  Musculoskeletal: Negative for arthralgias, gait problem, myalgias and neck pain.  Skin: Negative for color change and rash.  Neurological: Negative for dizziness and headaches.  Hematological: Negative for adenopathy. Does not bruise/bleed easily.  Psychiatric/Behavioral: Positive for sleep disturbance, dysphoric mood and decreased concentration. Negative for suicidal ideas. The patient is not nervous/anxious.        Objective:   Physical Exam  Constitutional: She is oriented to person, place, and time. She appears well-developed and well-nourished. No distress.  HENT:  Head: Normocephalic and atraumatic.  Right Ear: External ear normal.  Left Ear: External ear normal.  Nose: Nose normal.  Mouth/Throat: Oropharynx is clear and moist. No oropharyngeal exudate.  Eyes: Conjunctivae are normal. Pupils are equal, round, and reactive  to light. Right eye exhibits no discharge. Left eye exhibits no discharge. No scleral icterus.  Neck: Normal range of motion. Neck supple. No tracheal deviation present. No thyromegaly present.  Cardiovascular: Normal rate, regular rhythm, normal heart sounds and intact distal pulses.  Exam reveals no gallop and no friction rub.   No murmur heard. Pulmonary/Chest: Effort normal and breath sounds normal. No accessory muscle usage. Not tachypneic. No respiratory distress. She has no decreased breath sounds. She has no wheezes. She has no rhonchi. She has no rales. She exhibits no tenderness.  Abdominal: Soft. Bowel sounds are normal. She exhibits no distension and no mass. There is no tenderness. There is no rebound and no guarding.  Musculoskeletal: Normal range of motion. She exhibits no edema and no tenderness.  Lymphadenopathy:    She has no cervical adenopathy.  Neurological: She is alert and oriented to person, place, and time. No cranial nerve deficit. She exhibits normal muscle tone. Coordination normal.  Skin: Skin is warm and dry. No rash noted. She is not diaphoretic. No erythema. No pallor.  Psychiatric: She has a normal mood and affect. Her behavior is normal. Judgment and thought content normal.          Assessment & Plan:

## 2013-08-31 NOTE — Assessment & Plan Note (Signed)
Persistent fatigue despite starting B12 supplementation with monthly injections. Will recheck B12 level and CBC today.

## 2013-08-31 NOTE — Assessment & Plan Note (Signed)
Symptoms of constipation and diarrhea most consistent with IBS. Will try adding Levsin to help with cramping. Encouraged her to consider follow up with her GI physician. Note that recent colonoscopy normal.  Follow up prn.

## 2013-08-31 NOTE — Assessment & Plan Note (Addendum)
Chronic constipation alternating with diarrhea, abdominal cramping. Recent colonoscopy normal. Will try adding Levsin to help with abdominal cramping. Discussed daily probiotic to help with symptoms. Miralax daily for constipation. Discussed referral back to Dr. Markham Jordan in GI for follow up, however pt declines.

## 2013-08-31 NOTE — Assessment & Plan Note (Signed)
Symptoms improved, however some persistent apathy. Difficult time of year for her after her son's death. S/p counseling. Not interested in repeat referral. Intolerant of SSRIs in the past, and prefers to work on symptoms without use of medications. Follow up 3 months and prn.

## 2013-08-31 NOTE — Assessment & Plan Note (Signed)
Symptoms well controlled on Adderall. Functioning well at work with improved concentration. Will continue Adderall. 3 months Rx given. Plan follow up in 3 months and prn.   

## 2013-09-01 LAB — CBC WITH DIFFERENTIAL/PLATELET
Basophils Absolute: 0.1 10*3/uL (ref 0.0–0.1)
Basophils Relative: 0.9 % (ref 0.0–3.0)
Eosinophils Absolute: 0.2 10*3/uL (ref 0.0–0.7)
HCT: 36.6 % (ref 36.0–46.0)
Hemoglobin: 12.5 g/dL (ref 12.0–15.0)
Lymphocytes Relative: 32.2 % (ref 12.0–46.0)
Lymphs Abs: 2.5 10*3/uL (ref 0.7–4.0)
MCHC: 34.2 g/dL (ref 30.0–36.0)
MCV: 92.2 fl (ref 78.0–100.0)
Monocytes Absolute: 0.5 10*3/uL (ref 0.1–1.0)
Monocytes Relative: 5.8 % (ref 3.0–12.0)
Neutro Abs: 4.6 10*3/uL (ref 1.4–7.7)
RBC: 3.97 Mil/uL (ref 3.87–5.11)
RDW: 12.9 % (ref 11.5–14.6)

## 2013-09-01 LAB — VITAMIN B12: Vitamin B-12: 361 pg/mL (ref 211–911)

## 2013-12-03 ENCOUNTER — Ambulatory Visit (INDEPENDENT_AMBULATORY_CARE_PROVIDER_SITE_OTHER): Payer: 59 | Admitting: Internal Medicine

## 2013-12-03 ENCOUNTER — Encounter: Payer: Self-pay | Admitting: Internal Medicine

## 2013-12-03 VITALS — BP 110/62 | HR 71 | Temp 98.0°F | Wt 130.0 lb

## 2013-12-03 DIAGNOSIS — R5383 Other fatigue: Secondary | ICD-10-CM

## 2013-12-03 DIAGNOSIS — R5381 Other malaise: Secondary | ICD-10-CM

## 2013-12-03 DIAGNOSIS — F988 Other specified behavioral and emotional disorders with onset usually occurring in childhood and adolescence: Secondary | ICD-10-CM

## 2013-12-03 DIAGNOSIS — F3289 Other specified depressive episodes: Secondary | ICD-10-CM

## 2013-12-03 DIAGNOSIS — F32A Depression, unspecified: Secondary | ICD-10-CM

## 2013-12-03 DIAGNOSIS — E785 Hyperlipidemia, unspecified: Secondary | ICD-10-CM

## 2013-12-03 DIAGNOSIS — F329 Major depressive disorder, single episode, unspecified: Secondary | ICD-10-CM

## 2013-12-03 DIAGNOSIS — D510 Vitamin B12 deficiency anemia due to intrinsic factor deficiency: Secondary | ICD-10-CM

## 2013-12-03 LAB — CBC WITH DIFFERENTIAL/PLATELET
Basophils Absolute: 0.1 10*3/uL (ref 0.0–0.1)
Basophils Relative: 1 % (ref 0–1)
EOS ABS: 0.2 10*3/uL (ref 0.0–0.7)
EOS PCT: 3 % (ref 0–5)
HCT: 37.4 % (ref 36.0–46.0)
Hemoglobin: 12.9 g/dL (ref 12.0–15.0)
Lymphocytes Relative: 36 % (ref 12–46)
Lymphs Abs: 2.8 10*3/uL (ref 0.7–4.0)
MCH: 31 pg (ref 26.0–34.0)
MCHC: 34.5 g/dL (ref 30.0–36.0)
MCV: 89.9 fL (ref 78.0–100.0)
Monocytes Absolute: 0.5 10*3/uL (ref 0.1–1.0)
Monocytes Relative: 6 % (ref 3–12)
Neutro Abs: 4.3 10*3/uL (ref 1.7–7.7)
Neutrophils Relative %: 54 % (ref 43–77)
Platelets: 291 10*3/uL (ref 150–400)
RBC: 4.16 MIL/uL (ref 3.87–5.11)
RDW: 13 % (ref 11.5–15.5)
WBC: 7.9 10*3/uL (ref 4.0–10.5)

## 2013-12-03 LAB — COMPREHENSIVE METABOLIC PANEL
ALK PHOS: 85 U/L (ref 39–117)
ALT: 19 U/L (ref 0–35)
AST: 19 U/L (ref 0–37)
Albumin: 4.6 g/dL (ref 3.5–5.2)
BUN: 20 mg/dL (ref 6–23)
CO2: 25 meq/L (ref 19–32)
Calcium: 9.4 mg/dL (ref 8.4–10.5)
Chloride: 101 mEq/L (ref 96–112)
Creat: 0.78 mg/dL (ref 0.50–1.10)
Glucose, Bld: 96 mg/dL (ref 70–99)
Potassium: 4.5 mEq/L (ref 3.5–5.3)
SODIUM: 136 meq/L (ref 135–145)
Total Bilirubin: 0.4 mg/dL (ref 0.2–1.2)
Total Protein: 6.5 g/dL (ref 6.0–8.3)

## 2013-12-03 MED ORDER — AMPHETAMINE-DEXTROAMPHETAMINE 10 MG PO TABS: 10.0000 mg | ORAL_TABLET | Freq: Two times a day (BID) | ORAL | Status: DC

## 2013-12-03 MED ORDER — CLONAZEPAM 0.5 MG PO TABS
1.0000 mg | ORAL_TABLET | Freq: Three times a day (TID) | ORAL | Status: DC | PRN
Start: 2013-12-03 — End: 2014-06-16

## 2013-12-03 NOTE — Assessment & Plan Note (Signed)
Recent increase in symptoms of fatigue. Will check CBC, B12, CMP with labs. Recent TSH normal.

## 2013-12-03 NOTE — Progress Notes (Signed)
Subjective:    Patient ID: Dana Benson, female    DOB: 04/23/1961, 53 y.o.   MRN: [id number]  HPI 53YO female with ADD presents for follow up.  Complains of fatigue over last several months. Attributes this to winter months. No focal symptoms such as chest pain, dyspnea, change in bowel habits or appetite. Aside from this, feeling well. Working full time and doing real-estate on the side. Compliant with medication. No side effects noted.   Review of Systems  Constitutional: Positive for fatigue. Negative for fever, chills, appetite change and unexpected weight change.  HENT: Negative for congestion, ear pain, sinus pressure, sore throat, trouble swallowing and voice change.   Eyes: Negative for visual disturbance.  Respiratory: Negative for cough, shortness of breath, wheezing and stridor.   Cardiovascular: Negative for chest pain, palpitations and leg swelling.  Gastrointestinal: Negative for nausea, vomiting, abdominal pain, diarrhea, constipation, blood in stool, abdominal distention and anal bleeding.  Genitourinary: Negative for dysuria and flank pain.  Musculoskeletal: Negative for arthralgias, gait problem, myalgias and neck pain.  Skin: Negative for color change and rash.  Neurological: Negative for dizziness and headaches.  Hematological: Negative for adenopathy. Does not bruise/bleed easily.  Psychiatric/Behavioral: Negative for suicidal ideas, sleep disturbance and dysphoric mood. The patient is not nervous/anxious.        Objective:    BP 110/62  Pulse 71  Temp(Src) 98 F (36.7 C) (Oral)  Wt 130 lb (58.968 kg)  SpO2 98% Physical Exam  Constitutional: She is oriented to person, place, and time. She appears well-developed and well-nourished. No distress.  HENT:  Head: Normocephalic and atraumatic.  Right Ear: External ear normal.  Left Ear: External ear normal.  Nose: Nose normal.  Mouth/Throat: Oropharynx is clear and moist. No oropharyngeal exudate.    Eyes: Conjunctivae are normal. Pupils are equal, round, and reactive to light. Right eye exhibits no discharge. Left eye exhibits no discharge. No scleral icterus.  Neck: Normal range of motion. Neck supple. No tracheal deviation present. No thyromegaly present.  Cardiovascular: Normal rate, regular rhythm, normal heart sounds and intact distal pulses.  Exam reveals no gallop and no friction rub.   No murmur heard. Pulmonary/Chest: Effort normal and breath sounds normal. No accessory muscle usage. Not tachypneic. No respiratory distress. She has no decreased breath sounds. She has no wheezes. She has no rhonchi. She has no rales. She exhibits no tenderness.  Musculoskeletal: Normal range of motion. She exhibits no edema and no tenderness.  Lymphadenopathy:    She has no cervical adenopathy.  Neurological: She is alert and oriented to person, place, and time. No cranial nerve deficit. She exhibits normal muscle tone. Coordination normal.  Skin: Skin is warm and dry. No rash noted. She is not diaphoretic. No erythema. No pallor.  Psychiatric: She has a normal mood and affect. Her behavior is normal. Judgment and thought content normal.          Assessment & Plan:   Problem List Items Addressed This Visit   ADD (attention deficit disorder) - Primary     Symptoms well controlled on Adderall. Functioning well at work with improved concentration. Will continue Adderall. 3 months Rx given. Plan follow up in 3 months and prn.      Relevant Medications      amphetamine-dextroamphetamine (ADDERALL) tablet   Depression with anxiety     Symptoms well controlled with use of clonazepam for occasional anxiety. Will continue.    Relevant Medications  clonazePAM (KLONOPIN)  tablet   Other malaise and fatigue     Recent increase in symptoms of fatigue. Will check CBC, B12, CMP with labs. Recent TSH normal.    Pernicious anemia     Will check B12 level with labs today, given recent increase in  fatigue.    Relevant Orders      CBC w/Diff      B12    Other Visit Diagnoses   Other and unspecified hyperlipidemia        Relevant Orders       Comp Met (CMET)        Return in about 3 months (around 03/05/2014).

## 2013-12-03 NOTE — Assessment & Plan Note (Signed)
Will check B12 level with labs today, given recent increase in fatigue.

## 2013-12-03 NOTE — Assessment & Plan Note (Signed)
Symptoms well controlled with use of clonazepam for occasional anxiety. Will continue.

## 2013-12-03 NOTE — Progress Notes (Signed)
Pre visit review using our clinic review tool, if applicable. No additional management support is needed unless otherwise documented below in the visit note. 

## 2013-12-03 NOTE — Assessment & Plan Note (Signed)
Symptoms well controlled on Adderall. Functioning well at work with improved concentration. Will continue Adderall. 3 months Rx given. Plan follow up in 3 months and prn.

## 2013-12-04 LAB — VITAMIN B12: Vitamin B-12: 1154 pg/mL — ABNORMAL HIGH (ref 211–911)

## 2014-03-10 ENCOUNTER — Ambulatory Visit (INDEPENDENT_AMBULATORY_CARE_PROVIDER_SITE_OTHER): Payer: 59 | Admitting: Internal Medicine

## 2014-03-10 ENCOUNTER — Encounter: Payer: Self-pay | Admitting: Internal Medicine

## 2014-03-10 VITALS — BP 108/66 | HR 75 | Temp 98.5°F | Ht 64.5 in | Wt 122.2 lb

## 2014-03-10 DIAGNOSIS — F418 Other specified anxiety disorders: Secondary | ICD-10-CM

## 2014-03-10 DIAGNOSIS — N959 Unspecified menopausal and perimenopausal disorder: Secondary | ICD-10-CM

## 2014-03-10 DIAGNOSIS — F419 Anxiety disorder, unspecified: Secondary | ICD-10-CM

## 2014-03-10 DIAGNOSIS — F341 Dysthymic disorder: Secondary | ICD-10-CM

## 2014-03-10 DIAGNOSIS — F411 Generalized anxiety disorder: Secondary | ICD-10-CM

## 2014-03-10 DIAGNOSIS — F988 Other specified behavioral and emotional disorders with onset usually occurring in childhood and adolescence: Secondary | ICD-10-CM

## 2014-03-10 MED ORDER — PAROXETINE HCL 10 MG PO TABS: 10.0000 mg | ORAL_TABLET | Freq: Every day | ORAL | Status: DC

## 2014-03-10 MED ORDER — AMPHETAMINE-DEXTROAMPHETAMINE 10 MG PO TABS: 10.0000 mg | ORAL_TABLET | Freq: Two times a day (BID) | ORAL | Status: DC

## 2014-03-10 MED ORDER — CYANOCOBALAMIN 1000 MCG/ML IJ SOLN: INTRAMUSCULAR | Status: DC

## 2014-03-10 MED ORDER — AMPHETAMINE-DEXTROAMPHETAMINE 10 MG PO TABS
10.0000 mg | ORAL_TABLET | Freq: Two times a day (BID) | ORAL | Status: DC
Start: 2014-03-10 — End: 2014-03-10

## 2014-03-10 NOTE — Assessment & Plan Note (Signed)
Symptoms of decreased concentration recently worsened with increased work stressors. Discussed possible referral to psychiatry for medication adjustment/change, but she would like to hold off for now. Will continue Adderrall. Follow up in 4 weeks and prn.

## 2014-03-10 NOTE — Progress Notes (Signed)
Subjective:    Patient ID: Dana MayhewLinda M Benson, female    DOB: 05/04/1961, 53 y.o.   MRN: [id number]  HPI [name] presents for follow up. Notes increased stress at work. Has trouble getting out of bed and to work because of fatigue. Continues to have poor sleep, however improved with use of Clonazepam. Hot flashes often wake up at night. Frequent depressed mood. Not interested in referral to psychiatry. No suicidal ideation. Thinks that things will improve when work stressors improve.   Review of Systems  Constitutional: Negative for fever, chills, appetite change, fatigue and unexpected weight change.  HENT: Negative for congestion, ear pain, sinus pressure, sore throat, trouble swallowing and voice change.   Eyes: Negative for visual disturbance.  Respiratory: Negative for cough, shortness of breath, wheezing and stridor.   Cardiovascular: Negative for chest pain, palpitations and leg swelling.  Gastrointestinal: Negative for nausea, vomiting, abdominal pain, diarrhea, constipation, blood in stool, abdominal distention and anal bleeding.  Genitourinary: Negative for dysuria and flank pain.  Musculoskeletal: Negative for arthralgias, gait problem, myalgias and neck pain.  Skin: Negative for color change and rash.  Neurological: Negative for dizziness and headaches.  Hematological: Negative for adenopathy. Does not bruise/bleed easily.  Psychiatric/Behavioral: Positive for sleep disturbance, dysphoric mood and decreased concentration. Negative for suicidal ideas. The patient is nervous/anxious.        Objective:    BP 108/66  Pulse 75  Temp(Src) 98.5 F (36.9 C) (Oral)  Ht 5' 4.5" (1.638 m)  Wt 122 lb 4 oz (55.452 kg)  BMI 20.67 kg/m2  SpO2 98% Physical Exam  Constitutional: She is oriented to person, place, and time. She appears well-developed and well-nourished. No distress.  HENT:  Head: Normocephalic and atraumatic.  Right Ear: External ear normal.  Left Ear:  External ear normal.  Nose: Nose normal.  Mouth/Throat: Oropharynx is clear and moist. No oropharyngeal exudate.  Eyes: Conjunctivae are normal. Pupils are equal, round, and reactive to light. Right eye exhibits no discharge. Left eye exhibits no discharge. No scleral icterus.  Neck: Normal range of motion. Neck supple. No tracheal deviation present. No thyromegaly present.  Cardiovascular: Normal rate, regular rhythm, normal heart sounds and intact distal pulses.  Exam reveals no gallop and no friction rub.   No murmur heard. Pulmonary/Chest: Effort normal and breath sounds normal. No accessory muscle usage. Not tachypneic. No respiratory distress. She has no decreased breath sounds. She has no wheezes. She has no rhonchi. She has no rales. She exhibits no tenderness.  Musculoskeletal: Normal range of motion. She exhibits no edema and no tenderness.  Lymphadenopathy:    She has no cervical adenopathy.  Neurological: She is alert and oriented to person, place, and time. No cranial nerve deficit. She exhibits normal muscle tone. Coordination normal.  Skin: Skin is warm and dry. No rash noted. She is not diaphoretic. No erythema. No pallor.  Psychiatric: Her speech is normal and behavior is normal. Judgment and thought content normal. Cognition and memory are normal. She exhibits a depressed mood. She expresses no suicidal ideation.          Assessment & Plan:   Problem List Items Addressed This Visit     Unprioritized   ADD (attention deficit disorder) - Primary     Symptoms of decreased concentration recently worsened with increased work stressors. Discussed possible referral to psychiatry for medication adjustment/change, but she would like to hold off for now. Will continue Adderrall. Follow up in 4 weeks and prn.  Relevant Medications      amphetamine-dextroamphetamine (ADDERALL) tablet   Anxiety     Recent increase in anxiety with work stressors. Will continue prn Alprazolam for  severe anxiety/panic. Recommended referral to psychiatry. Pt declines.    Relevant Medications      PARoxetine (PAXIL) tablet   Depression with anxiety     Persistent mild symptoms of depression. Has been intolerant of SSRI in the past, however willing to try low dose of Paxil to help with depression, anxiety and hot flashes. Start Paxil 10mg  daily. Follow up 4 weeks and prn.     Other Visit Diagnoses   Menopausal disorder        Relevant Medications       PARoxetine (PAXIL) tablet        Return in about 4 weeks (around 04/07/2014) for Recheck.

## 2014-03-10 NOTE — Patient Instructions (Signed)
Start Paroxetine 10mg  daily to help with hot flashes.  Follow up in 4 weeks.

## 2014-03-10 NOTE — Assessment & Plan Note (Signed)
Recent increase in anxiety with work stressors. Will continue prn Alprazolam for severe anxiety/panic. Recommended referral to psychiatry. Pt declines.

## 2014-03-10 NOTE — Progress Notes (Signed)
Pre visit review using our clinic review tool, if applicable. No additional management support is needed unless otherwise documented below in the visit note. 

## 2014-03-10 NOTE — Assessment & Plan Note (Signed)
Symptoms of hot flashes limiting sleep at night. Will start low dose of Paxil to help with symptoms. Follow up 4 weeks and prn.

## 2014-03-10 NOTE — Assessment & Plan Note (Signed)
Persistent mild symptoms of depression. Has been intolerant of SSRI in the past, however willing to try low dose of Paxil to help with depression, anxiety and hot flashes. Start Paxil 10mg  daily. Follow up 4 weeks and prn.

## 2014-04-05 ENCOUNTER — Encounter: Payer: Self-pay | Admitting: Internal Medicine

## 2014-04-15 ENCOUNTER — Ambulatory Visit (INDEPENDENT_AMBULATORY_CARE_PROVIDER_SITE_OTHER): Payer: 59 | Admitting: Internal Medicine

## 2014-04-15 ENCOUNTER — Encounter: Payer: Self-pay | Admitting: Internal Medicine

## 2014-04-15 VITALS — BP 102/74 | HR 69 | Temp 98.1°F | Ht 64.5 in | Wt 118.8 lb

## 2014-04-15 DIAGNOSIS — K5900 Constipation, unspecified: Secondary | ICD-10-CM

## 2014-04-15 DIAGNOSIS — F418 Other specified anxiety disorders: Secondary | ICD-10-CM

## 2014-04-15 DIAGNOSIS — F341 Dysthymic disorder: Secondary | ICD-10-CM

## 2014-04-15 DIAGNOSIS — K5909 Other constipation: Secondary | ICD-10-CM

## 2014-04-15 DIAGNOSIS — F988 Other specified behavioral and emotional disorders with onset usually occurring in childhood and adolescence: Secondary | ICD-10-CM

## 2014-04-15 MED ORDER — LINACLOTIDE 145 MCG PO CAPS: 145.0000 ug | ORAL_CAPSULE | Freq: Every day | ORAL | Status: DC

## 2014-04-15 MED ORDER — ESCITALOPRAM OXALATE 5 MG PO TABS: 5.0000 mg | ORAL_TABLET | Freq: Every day | ORAL | Status: DC

## 2014-04-15 NOTE — Assessment & Plan Note (Signed)
Chronic constipation. Recent colonoscopy normal. No improvement with increased fluid intake, OTC meds. Will try adding Linzess. Follow up in 4 weeks.

## 2014-04-15 NOTE — Progress Notes (Signed)
Pre visit review using our clinic review tool, if applicable. No additional management support is needed unless otherwise documented below in the visit note. 

## 2014-04-15 NOTE — Patient Instructions (Addendum)
Start Linzess 145mcg daily to help with constipation.  Start Lexapro 5mg  daily to help with depression.  Follow up in 4 weeks or sooner as needed.

## 2014-04-15 NOTE — Assessment & Plan Note (Signed)
Symptoms well controlled on current medication. Will continue. 

## 2014-04-15 NOTE — Progress Notes (Signed)
Subjective:    Patient ID: Dana MayhewLinda M Benson, female    DOB: 04/11/1961, 53 y.o.   MRN: [id number]  HPI [name] presents for follow up. Tried taking Paxil. Developed severe nausea and had to stop medicine. Feeling "horrible." Feels tired. Limits contact with others. No focal physical symptoms. Some depressed mood. Has had trouble tolerating SSRIs in the past. Tolerated Celexa, but minimal improvement in symptoms. Chronic constipation. Strains for BM. Occasional low back pain with the constipation. Colonoscopy 2013 was normal. Questions if she might benefit from Linzess. Better overall last few days.  Review of Systems  Constitutional: Positive for fatigue. Negative for fever, chills, appetite change and unexpected weight change.  Eyes: Negative for visual disturbance.  Respiratory: Negative for shortness of breath.   Cardiovascular: Negative for chest pain and leg swelling.  Gastrointestinal: Positive for nausea and constipation. Negative for vomiting, abdominal pain, diarrhea and blood in stool.  Musculoskeletal: Positive for back pain and myalgias.  Skin: Negative for color change and rash.  Hematological: Negative for adenopathy. Does not bruise/bleed easily.  Psychiatric/Behavioral: Positive for dysphoric mood. Negative for sleep disturbance. The patient is not nervous/anxious.        Objective:    BP 102/74  Pulse 69  Temp(Src) 98.1 F (36.7 C) (Oral)  Ht 5' 4.5" (1.638 m)  Wt 118 lb 12 oz (53.865 kg)  BMI 20.08 kg/m2  SpO2 99% Physical Exam  Constitutional: She is oriented to person, place, and time. She appears well-developed and well-nourished. No distress.  HENT:  Head: Normocephalic and atraumatic.  Right Ear: External ear normal.  Left Ear: External ear normal.  Nose: Nose normal.  Mouth/Throat: Oropharynx is clear and moist. No oropharyngeal exudate.  Eyes: Conjunctivae are normal. Pupils are equal, round, and reactive to light. Right eye exhibits no  discharge. Left eye exhibits no discharge. No scleral icterus.  Neck: Normal range of motion. Neck supple. No tracheal deviation present. No thyromegaly present.  Cardiovascular: Normal rate, regular rhythm, normal heart sounds and intact distal pulses.  Exam reveals no gallop and no friction rub.   No murmur heard. Pulmonary/Chest: Effort normal and breath sounds normal. No accessory muscle usage. Not tachypneic. No respiratory distress. She has no decreased breath sounds. She has no wheezes. She has no rhonchi. She has no rales. She exhibits no tenderness.  Abdominal: Soft. Bowel sounds are normal. She exhibits no distension and no mass. There is no tenderness. There is no rebound and no guarding.  Musculoskeletal: Normal range of motion. She exhibits no edema and no tenderness.  Lymphadenopathy:    She has no cervical adenopathy.  Neurological: She is alert and oriented to person, place, and time. No cranial nerve deficit. She exhibits normal muscle tone. Coordination normal.  Skin: Skin is warm and dry. No rash noted. She is not diaphoretic. No erythema. No pallor.  Psychiatric: Her speech is normal and behavior is normal. Judgment and thought content normal. She exhibits a depressed mood. She expresses no suicidal ideation. She expresses no suicidal plans.          Assessment & Plan:   Problem List Items Addressed This Visit     Unprioritized   ADD (attention deficit disorder)     Symptoms well controlled on current medication. Will continue.    Chronic constipation     Chronic constipation. Recent colonoscopy normal. No improvement with increased fluid intake, OTC meds. Will try adding Linzess. Follow up in 4 weeks.    Relevant Medications  linaclotide (LINZESS) capsule   Depression with anxiety - Primary     Symptoms persistent. Unable to tolerate several SSRIs, however did tolerate Celexa in the past. Will try change to Lexapro 5mg  daily. Follow up in 4 weeks or sooner as  needed.    Relevant Medications      escitalopram (LEXAPRO) tablet       Return in about 4 weeks (around 05/13/2014) for Recheck.

## 2014-04-15 NOTE — Assessment & Plan Note (Signed)
Symptoms persistent. Unable to tolerate several SSRIs, however did tolerate Celexa in the past. Will try change to Lexapro 5mg  daily. Follow up in 4 weeks or sooner as needed.

## 2014-04-16 ENCOUNTER — Telehealth: Payer: Self-pay | Admitting: Internal Medicine

## 2014-04-16 NOTE — Telephone Encounter (Signed)
Relevant patient education assigned to patient using Emmi. ° °

## 2014-06-03 ENCOUNTER — Encounter: Payer: Self-pay | Admitting: Internal Medicine

## 2014-06-03 ENCOUNTER — Other Ambulatory Visit (HOSPITAL_COMMUNITY)
Admission: RE | Admit: 2014-06-03 | Discharge: 2014-06-03 | Disposition: A | Discharge status: Home / Self Care | Payer: 59 | Source: Ambulatory Visit | Attending: Internal Medicine | Admitting: Internal Medicine

## 2014-06-03 ENCOUNTER — Ambulatory Visit (INDEPENDENT_AMBULATORY_CARE_PROVIDER_SITE_OTHER): Payer: 59 | Admitting: Internal Medicine

## 2014-06-03 VITALS — BP 102/78 | HR 74 | Temp 98.2°F | Ht 64.5 in | Wt 119.5 lb

## 2014-06-03 DIAGNOSIS — F988 Other specified behavioral and emotional disorders with onset usually occurring in childhood and adolescence: Secondary | ICD-10-CM

## 2014-06-03 DIAGNOSIS — Z0000 Encounter for general adult medical examination without abnormal findings: Secondary | ICD-10-CM

## 2014-06-03 DIAGNOSIS — Z01419 Encounter for gynecological examination (general) (routine) without abnormal findings: Secondary | ICD-10-CM | POA: Diagnosis present

## 2014-06-03 DIAGNOSIS — K5900 Constipation, unspecified: Secondary | ICD-10-CM

## 2014-06-03 DIAGNOSIS — Z124 Encounter for screening for malignant neoplasm of cervix: Secondary | ICD-10-CM

## 2014-06-03 DIAGNOSIS — Z1151 Encounter for screening for human papillomavirus (HPV): Secondary | ICD-10-CM | POA: Diagnosis present

## 2014-06-03 DIAGNOSIS — K5909 Other constipation: Secondary | ICD-10-CM

## 2014-06-03 LAB — CBC WITH DIFFERENTIAL/PLATELET
BASOS ABS: 0.1 10*3/uL (ref 0.0–0.1)
Basophils Relative: 0.8 % (ref 0.0–3.0)
Eosinophils Absolute: 0.2 10*3/uL (ref 0.0–0.7)
Eosinophils Relative: 3.4 % (ref 0.0–5.0)
HEMATOCRIT: 39.3 % (ref 36.0–46.0)
HEMOGLOBIN: 13.4 g/dL (ref 12.0–15.0)
LYMPHS ABS: 2.7 10*3/uL (ref 0.7–4.0)
Lymphocytes Relative: 38.4 % (ref 12.0–46.0)
MCHC: 34.1 g/dL (ref 30.0–36.0)
MCV: 91.1 fl (ref 78.0–100.0)
MONOS PCT: 5.3 % (ref 3.0–12.0)
Monocytes Absolute: 0.4 10*3/uL (ref 0.1–1.0)
NEUTROS ABS: 3.6 10*3/uL (ref 1.4–7.7)
Neutrophils Relative %: 52.1 % (ref 43.0–77.0)
Platelets: 214 10*3/uL (ref 150.0–400.0)
RBC: 4.32 Mil/uL (ref 3.87–5.11)
RDW: 13.1 % (ref 11.5–15.5)
WBC: 6.9 10*3/uL (ref 4.0–10.5)

## 2014-06-03 LAB — LIPID PANEL
Cholesterol: 197 mg/dL (ref 0–200)
HDL: 58.9 mg/dL (ref >39.00)
LDL Cholesterol: 118 mg/dL — ABNORMAL HIGH (ref 0–99)
NONHDL: 138.1
Total CHOL/HDL Ratio: 3
Triglycerides: 102 mg/dL (ref 0.0–149.0)
VLDL: 20.4 mg/dL (ref 0.0–40.0)

## 2014-06-03 LAB — COMPREHENSIVE METABOLIC PANEL
ALT: 18 U/L (ref 0–35)
AST: 20 U/L (ref 0–37)
Albumin: 4 g/dL (ref 3.5–5.2)
Alkaline Phosphatase: 86 U/L (ref 39–117)
BILIRUBIN TOTAL: 0.7 mg/dL (ref 0.2–1.2)
BUN: 10 mg/dL (ref 6–23)
CO2: 25 meq/L (ref 19–32)
CREATININE: 0.8 mg/dL (ref 0.4–1.2)
Calcium: 9.5 mg/dL (ref 8.4–10.5)
Chloride: 104 mEq/L (ref 96–112)
GFR: 77.54 mL/min (ref >60.00)
Glucose, Bld: 73 mg/dL (ref 70–99)
Potassium: 3.9 mEq/L (ref 3.5–5.1)
Sodium: 138 mEq/L (ref 135–145)
Total Protein: 6.6 g/dL (ref 6.0–8.3)

## 2014-06-03 LAB — VITAMIN D 25 HYDROXY (VIT D DEFICIENCY, FRACTURES): VITD: 37.84 ng/mL (ref 30.00–100.00)

## 2014-06-03 LAB — MICROALBUMIN / CREATININE URINE RATIO
Creatinine,U: 41.6 mg/dL
Microalb Creat Ratio: 0.5 mg/g (ref 0.0–30.0)
Microalb, Ur: 0.2 mg/dL (ref 0.0–1.9)

## 2014-06-03 MED ORDER — CYANOCOBALAMIN 1000 MCG/ML IJ SOLN: 1000.0000 ug | Freq: Once | INTRAMUSCULAR | Status: DC

## 2014-06-03 MED ORDER — AMPHETAMINE-DEXTROAMPHETAMINE 10 MG PO TABS: 10.0000 mg | ORAL_TABLET | Freq: Two times a day (BID) | ORAL | Status: DC

## 2014-06-03 MED ORDER — LINACLOTIDE 290 MCG PO CAPS: 290.0000 ug | ORAL_CAPSULE | Freq: Every day | ORAL | Status: DC

## 2014-06-03 NOTE — Assessment & Plan Note (Signed)
Symptoms stable on Adderrall. Will continue. Rx 3 months given.

## 2014-06-03 NOTE — Assessment & Plan Note (Addendum)
General medical exam including breast and pelvic exam normal today. PAP pending. Mammogram has been ordered. Strongly encouraged smoking cessation. Will check labs today including CBC, CMP, lipid profile, TSH, B12. Encouraged healthy diet and regular physical activity.

## 2014-06-03 NOTE — Progress Notes (Signed)
Pre visit review using our clinic review tool, if applicable. No additional management support is needed unless otherwise documented below in the visit note. 

## 2014-06-03 NOTE — Assessment & Plan Note (Signed)
Symptoms of constipation persistent on Linzess dosing. Will increase to daily. Follow up in 3 months and prn.

## 2014-06-03 NOTE — Patient Instructions (Signed)

## 2014-06-03 NOTE — Addendum Note (Signed)
Addended by: Montine Circle D on: 06/03/2014 10:21 AM   Modules accepted: Orders

## 2014-06-03 NOTE — Progress Notes (Signed)
Subjective:    Patient ID: Dana Benson, female    DOB: [birth year], 53 y.o.   MRN: [id number]  HPI [name] presents for annual exam. Feeling well. Notes some vaginal dryness with white discharge. Not sexually active. S/p hysterectomy.  Symptoms of constipation have been persistent despite use of Linzess daily. No abdominal pain, blood in the stool. No NV.  Symptoms of concentration deficit have been stable on Adderrall. Compliant with medications.   Review of Systems  Constitutional: Negative for fever, chills, appetite change, fatigue and unexpected weight change.  Eyes: Negative for visual disturbance.  Respiratory: Negative for shortness of breath.   Cardiovascular: Negative for chest pain and leg swelling.  Gastrointestinal: Positive for constipation. Negative for nausea, vomiting, abdominal pain, diarrhea and blood in stool.  Musculoskeletal: Negative for arthralgias and myalgias.  Skin: Negative for color change and rash.  Hematological: Negative for adenopathy. Does not bruise/bleed easily.  Psychiatric/Behavioral: Negative for sleep disturbance and dysphoric mood. The patient is not nervous/anxious.        Objective:    BP 102/78  Pulse 74  Temp(Src) 98.2 F (36.8 C) (Oral)  Ht 5' 4.5" (1.638 m)  Wt 119 lb 8 oz (54.205 kg)  BMI 20.20 kg/m2  SpO2 98% Physical Exam  Constitutional: She is oriented to person, place, and time. She appears well-developed and well-nourished. No distress.  HENT:  Head: Normocephalic and atraumatic.  Right Ear: External ear normal.  Left Ear: External ear normal.  Nose: Nose normal.  Mouth/Throat: Oropharynx is clear and moist. No oropharyngeal exudate.  Eyes: Conjunctivae are normal. Pupils are equal, round, and reactive to light. Right eye exhibits no discharge. Left eye exhibits no discharge. No scleral icterus.  Neck: Normal range of motion. Neck supple. No tracheal deviation present. No thyromegaly present.    Cardiovascular: Normal rate, regular rhythm, normal heart sounds and intact distal pulses.  Exam reveals no gallop and no friction rub.   No murmur heard. Pulmonary/Chest: Effort normal and breath sounds normal. No respiratory distress. She has no wheezes. She has no rales. She exhibits no tenderness.  Abdominal: Soft. Bowel sounds are normal. She exhibits no distension and no mass. There is no tenderness. There is no rebound and no guarding.  Genitourinary: Rectum normal and vagina normal. No breast swelling, tenderness, discharge or bleeding. Pelvic exam was performed with patient supine. There is no rash, tenderness or lesion on the right labia. There is no rash, tenderness or lesion on the left labia. No erythema or tenderness around the vagina. No vaginal discharge found.  Uterus surgically absent. Vaginal cuff present with diffuse atrophy noted.  Musculoskeletal: Normal range of motion. She exhibits no edema and no tenderness.  Lymphadenopathy:    She has no cervical adenopathy.  Neurological: She is alert and oriented to person, place, and time. No cranial nerve deficit. She exhibits normal muscle tone. Coordination normal.  Skin: Skin is warm and dry. No rash noted. She is not diaphoretic. No erythema. No pallor.  Psychiatric: She has a normal mood and affect. Her behavior is normal. Judgment and thought content normal.          Assessment & Plan:   Problem List Items Addressed This Visit     Unprioritized   ADD (attention deficit disorder)     Symptoms stable on Adderrall. Will continue. Rx 3 months given.    Relevant Medications      amphetamine-dextroamphetamine (ADDERALL) tablet   Chronic constipation  Symptoms of constipation persistent on Linzess dosing. Will increase to daily. Follow up in 3 months and prn.    Relevant Medications      linaclotide (LINZESS) capsule   Routine general medical examination at a health care facility - Primary     General  medical exam including breast and pelvic exam normal today. PAP pending. Mammogram has been ordered. Strongly encouraged smoking cessation. Will check labs today including CBC, CMP, lipid profile, TSH, B12. Encouraged healthy diet and regular physical activity.      Relevant Orders      CBC with Differential      Comprehensive metabolic panel      Lipid panel      Microalbumin / creatinine urine ratio      Vit D  25 hydroxy (rtn osteoporosis monitoring)       Return in about 3 months (around 09/02/2014) for Recheck.

## 2014-06-09 LAB — CYTOLOGY - PAP

## 2014-06-16 ENCOUNTER — Other Ambulatory Visit: Payer: Self-pay | Admitting: Internal Medicine

## 2014-06-16 NOTE — Telephone Encounter (Signed)
Last refill 7.24.15, last CPE 9.4.15.  Please advise refill.

## 2014-06-16 NOTE — Telephone Encounter (Signed)
Rx faxed

## 2014-06-27 LAB — HM PAP SMEAR: HM PAP: NEGATIVE

## 2014-07-15 ENCOUNTER — Other Ambulatory Visit: Payer: Self-pay

## 2014-08-04 ENCOUNTER — Ambulatory Visit: Payer: Self-pay | Admitting: Internal Medicine

## 2014-08-04 LAB — HM PAP SMEAR: HM PAP: NORMAL

## 2014-09-05 ENCOUNTER — Encounter: Payer: Self-pay | Admitting: Internal Medicine

## 2014-09-09 ENCOUNTER — Ambulatory Visit: Payer: 59 | Admitting: Internal Medicine

## 2014-10-21 ENCOUNTER — Ambulatory Visit: Payer: 59 | Admitting: Internal Medicine

## 2014-10-24 ENCOUNTER — Encounter: Payer: Self-pay | Admitting: Internal Medicine

## 2014-10-24 ENCOUNTER — Ambulatory Visit (INDEPENDENT_AMBULATORY_CARE_PROVIDER_SITE_OTHER): Payer: 59 | Admitting: Internal Medicine

## 2014-10-24 VITALS — BP 124/74 | HR 65 | Temp 97.8°F | Ht 64.5 in | Wt 132.0 lb

## 2014-10-24 DIAGNOSIS — F418 Other specified anxiety disorders: Secondary | ICD-10-CM

## 2014-10-24 DIAGNOSIS — F988 Other specified behavioral and emotional disorders with onset usually occurring in childhood and adolescence: Secondary | ICD-10-CM

## 2014-10-24 DIAGNOSIS — F909 Attention-deficit hyperactivity disorder, unspecified type: Secondary | ICD-10-CM

## 2014-10-24 MED ORDER — ESCITALOPRAM OXALATE 10 MG PO TABS: 10.0000 mg | ORAL_TABLET | Freq: Every day | ORAL | Status: DC

## 2014-10-24 MED ORDER — LISDEXAMFETAMINE DIMESYLATE 30 MG PO CAPS: 30.0000 mg | ORAL_CAPSULE | Freq: Every day | ORAL | Status: DC

## 2014-10-24 NOTE — Patient Instructions (Signed)
Stop Adderrall.  Start Vyvanse 30mg  daily.  Increase Lexapro to 10mg  daily.  Follow up in 1 month.

## 2014-10-24 NOTE — Assessment & Plan Note (Signed)
Discussed potential options for long-acting stimulant meds. Will stop Adderall and start Vyvanse. Discussed potential side effects of this medication. Follow up 4 weeks and prn.

## 2014-10-24 NOTE — Assessment & Plan Note (Signed)
Symptoms of worsening depression this fall. Now improved, however will increase Lexapro to 10mg  daily. Follow up in 4 weeks and prn.

## 2014-10-24 NOTE — Progress Notes (Signed)
Pre visit review using our clinic review tool, if applicable. No additional management support is needed unless otherwise documented below in the visit note. 

## 2014-10-24 NOTE — Progress Notes (Signed)
Subjective:    Patient ID: Dana Benson, female    DOB: [birth year], 53 y.o.   MRN: [id number]  HPI 53YO female presents for follow up.  Had worsening symptoms of depression in October. Started on Paxil on her own that month, because she felt it was stronger than Lexapro. This seemed to help. Now off Paxil. Now taking Lexapro.  Having trouble focusing on Adderrall. 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 Finds this very stressful. Would like to try a longer acting ADD med.   Past medical, surgical, family and social history per today's encounter.  Review of Systems  Constitutional: Negative for fever, chills, appetite change, fatigue and unexpected weight change.  Eyes: Negative for visual disturbance.  Respiratory: Negative for shortness of breath.   Cardiovascular: Negative for chest pain and leg swelling.  Gastrointestinal: Negative for nausea, vomiting, abdominal pain, diarrhea and constipation.  Skin: Negative for color change and rash.  Hematological: Negative for adenopathy. Does not bruise/bleed easily.  Psychiatric/Behavioral: Positive for behavioral problems, dysphoric mood (now improved) and decreased concentration. Negative for suicidal ideas and sleep disturbance. The patient is not nervous/anxious.        Objective:    BP 124/74 mmHg  Pulse 65  Temp(Src) 97.8 F (36.6 C) (Oral)  Ht 5' 4.5" (1.638 m)  Wt 132 lb (59.875 kg)  BMI 22.32 kg/m2  SpO2 97% Physical Exam  Constitutional: She is oriented to person, place, and time. She appears well-developed and well-nourished. No distress.  HENT:  Head: Normocephalic and atraumatic.  Right Ear: External ear normal.  Left Ear: External ear normal.  Nose: Nose normal.  Mouth/Throat: Oropharynx is clear and moist. No oropharyngeal exudate.  Eyes: Conjunctivae are normal. Pupils are equal, round, and reactive to light. Right eye exhibits no discharge. Left eye exhibits no discharge. No scleral icterus.    Neck: Normal range of motion. Neck supple. No tracheal deviation present. No thyromegaly present.  Cardiovascular: Normal rate, regular rhythm, normal heart sounds and intact distal pulses.  Exam reveals no gallop and no friction rub.   No murmur heard. Pulmonary/Chest: Effort normal and breath sounds normal. No accessory muscle usage. No tachypnea. No respiratory distress. She has no decreased breath sounds. She has no wheezes. She has no rhonchi. She has no rales. She exhibits no tenderness.  Musculoskeletal: Normal range of motion. She exhibits no edema or tenderness.  Lymphadenopathy:    She has no cervical adenopathy.  Neurological: She is alert and oriented to person, place, and time. No cranial nerve deficit. She exhibits normal muscle tone. Coordination normal.  Skin: Skin is warm and dry. No rash noted. She is not diaphoretic. No erythema. No pallor.  Psychiatric: She has a normal mood and affect. Her behavior is normal. Judgment and thought content normal.          Assessment & Plan:   Problem List Items Addressed This Visit      Unprioritized   ADD (attention deficit disorder)    Discussed potential options for long-acting stimulant meds. Will stop Adderall and start Vyvanse. Discussed potential side effects of this medication. Follow up 4 weeks and prn.       Relevant Medications   lisdexamfetamine (VYVANSE) capsule   Depression with anxiety - Primary    Symptoms of worsening depression this fall. Now improved, however will increase Lexapro to  daily. Follow up in 4 weeks and prn.      Relevant Medications   escitalopram (LEXAPRO) tablet  Return in about 4 weeks (around 11/21/2014) for Recheck.

## 2014-11-24 ENCOUNTER — Encounter: Payer: Self-pay | Admitting: Internal Medicine

## 2014-11-24 ENCOUNTER — Ambulatory Visit (INDEPENDENT_AMBULATORY_CARE_PROVIDER_SITE_OTHER): Payer: 59 | Admitting: Internal Medicine

## 2014-11-24 VITALS — BP 142/88 | HR 78 | Temp 98.3°F | Ht 64.5 in | Wt 132.4 lb

## 2014-11-24 DIAGNOSIS — K5900 Constipation, unspecified: Secondary | ICD-10-CM

## 2014-11-24 DIAGNOSIS — F418 Other specified anxiety disorders: Secondary | ICD-10-CM

## 2014-11-24 DIAGNOSIS — F988 Other specified behavioral and emotional disorders with onset usually occurring in childhood and adolescence: Secondary | ICD-10-CM

## 2014-11-24 DIAGNOSIS — F909 Attention-deficit hyperactivity disorder, unspecified type: Secondary | ICD-10-CM

## 2014-11-24 DIAGNOSIS — K5909 Other constipation: Secondary | ICD-10-CM

## 2014-11-24 MED ORDER — CITALOPRAM HYDROBROMIDE 20 MG PO TABS: 20.0000 mg | ORAL_TABLET | Freq: Every day | ORAL | Status: DC

## 2014-11-24 MED ORDER — CLONAZEPAM 0.5 MG PO TABS: 0.5000 mg | ORAL_TABLET | Freq: Every day | ORAL | Status: DC

## 2014-11-24 MED ORDER — ATOMOXETINE HCL 40 MG PO CAPS: 40.0000 mg | ORAL_CAPSULE | Freq: Every day | ORAL | Status: DC

## 2014-11-24 MED ORDER — LINACLOTIDE 290 MCG PO CAPS: 290.0000 ug | ORAL_CAPSULE | Freq: Every day | ORAL | Status: DC

## 2014-11-24 NOTE — Patient Instructions (Signed)
Start Celexa 20mg  daily to help with depression.  Start Strattera to help with ADD. Follow up in 4 weeks.

## 2014-11-24 NOTE — Assessment & Plan Note (Signed)
Persistent symptoms of depression. Will try Celexa as that worked well for her in the past. Follow up in 4 weeks and prn.

## 2014-11-24 NOTE — Assessment & Plan Note (Signed)
Symptoms poorly controlled with Vyvanse. Will try change to Strattera. Recommended referral to psychiatry for medication management. She declines, stating she cannot afford this. Will try Strattera for now. Discussed risks of using Adderral and will try to avoid if possible. Follow up in 4 weeks and prn.

## 2014-11-24 NOTE — Assessment & Plan Note (Signed)
Symptoms of constipation improved with Linzess. Will continue.

## 2014-11-24 NOTE — Progress Notes (Signed)
rx called in

## 2014-11-24 NOTE — Progress Notes (Signed)
Pre visit review using our clinic review tool, if applicable. No additional management support is needed unless otherwise documented below in the visit note. 

## 2014-11-24 NOTE — Progress Notes (Signed)
Subjective:    Patient ID: Dana Benson, female    DOB: 11/11/1960, 54 y.o.   MRN: [id number]  HPI 54YO female presents for follow up.  Last seen 1/25. Changed from Adderall to Vyvanse and started on Lexapro.   Felt nauseous on Lexapro, so stopped medication.  ADHD - No improvement with Vyvanse. Feels like not taking any medication. "All over the place" at work. Loses concentration. Having trouble keeping up at work. Feeling more anxious and stressed at work. Would like to consider an alternative med  Depression - Had improvement with Paxil in past, but ended up feeling detached. Would like to try Celexa again, as this worked well for her in the past.   Past medical, surgical, family and social history per today's encounter.  Review of Systems  Constitutional: Negative for fever, chills, appetite change, fatigue and unexpected weight change.  Eyes: Negative for visual disturbance.  Respiratory: Negative for shortness of breath.   Cardiovascular: Negative for chest pain and leg swelling.  Gastrointestinal: Negative for abdominal pain.  Skin: Negative for color change and rash.  Hematological: Negative for adenopathy. Does not bruise/bleed easily.  Psychiatric/Behavioral: Positive for behavioral problems, sleep disturbance, dysphoric mood and decreased concentration. Negative for suicidal ideas. The patient is nervous/anxious.        Objective:    BP 142/88 mmHg  Pulse 78  Temp(Src) 98.3 F (36.8 C) (Oral)  Ht 5' 4.5" (1.638 m)  Wt 132 lb 6 oz (60.045 kg)  BMI 22.38 kg/m2  SpO2 99% Physical Exam  Constitutional: She is oriented to person, place, and time. She appears well-developed and well-nourished. No distress.  HENT:  Head: Normocephalic and atraumatic.  Right Ear: External ear normal.  Left Ear: External ear normal.  Nose: Nose normal.  Mouth/Throat: Oropharynx is clear and moist. No oropharyngeal exudate.  Eyes: Conjunctivae are normal. Pupils are equal,  round, and reactive to light. Right eye exhibits no discharge. Left eye exhibits no discharge. No scleral icterus.  Neck: Normal range of motion. Neck supple. No tracheal deviation present. No thyromegaly present.  Cardiovascular: Normal rate, regular rhythm, normal heart sounds and intact distal pulses.  Exam reveals no gallop and no friction rub.   No murmur heard. Pulmonary/Chest: Effort normal and breath sounds normal. No respiratory distress. She has no wheezes. She has no rales. She exhibits no tenderness.  Musculoskeletal: Normal range of motion. She exhibits no edema or tenderness.  Lymphadenopathy:    She has no cervical adenopathy.  Neurological: She is alert and oriented to person, place, and time. No cranial nerve deficit. She exhibits normal muscle tone. Coordination normal.  Skin: Skin is warm and dry. No rash noted. She is not diaphoretic. No erythema. No pallor.  Psychiatric: Her speech is normal and behavior is normal. Judgment and thought content normal. Her mood appears anxious. Cognition and memory are normal. She exhibits a depressed mood.  Has trouble staying on-topic during our conversation.          Assessment & Plan:  Over 25min of which >50% spent in face-to-face contact with patient discussing plan of care  Problem List Items Addressed This Visit      Unprioritized   ADD (attention deficit disorder) - Primary    Symptoms poorly controlled with Vyvanse. Will try change to Strattera. Recommended referral to psychiatry for medication management. She declines, stating she cannot afford this. Will try Strattera for now. Discussed risks of using Adderral and will try to avoid if possible. Follow  up in 4 weeks and prn.      Chronic constipation    Symptoms of constipation improved with Linzess. Will continue.      Relevant Medications   linaclotide (LINZESS) capsule   Depression with anxiety    Persistent symptoms of depression. Will try Celexa as that worked  well for her in the past. Follow up in 4 weeks and prn.          Return in about 4 weeks (around 12/22/2014) for Recheck.

## 2014-11-28 ENCOUNTER — Telehealth: Payer: Self-pay | Admitting: Internal Medicine

## 2014-11-28 NOTE — Telephone Encounter (Signed)
NO. She needs to be evaluated tonight. With her history, she is at risk of heart attack and arrhythmia. Needs ED evaluation tonight.

## 2014-11-28 NOTE — Telephone Encounter (Signed)
Nurse Assessment  Nurse: Shelva MajesticWest, RN, Marchelle FolksAmanda Date/Time Dana Benson(Eastern Time): 11/28/2014 2:55:31 PM  Confirm and document reason for call. If symptomatic, describe symptoms. ---Caller states she was put on new medicine but has not started it yet. Caller states Saturday she got short of breath. Caller states she felt a "jolt" in her chest and a rapid heart beat. Caller states she still feels the "jolt" in her chest still today. Caller states she has also felt exhausted. no actual pains in chest; legs feel weak  Has the patient traveled out of the country within the last 30 days? ---Not Applicable  Does the patient require triage? ---Yes  Related visit to physician within the last 2 weeks? ---Yes  Does the PT have any chronic conditions? (i.e. diabetes, asthma, etc.) ---Yes  List chronic conditions. ---depression  Did the patient indicate they were pregnant? ---No     Guidelines    Guideline Title Affirmed Question Affirmed Notes  Heart Rate and Heartbeat Questions [1] Heart beating very rapidly (e.g., > 140 / minute) AND [2] not present now (Exception: during exercise)    Final Disposition User   See Physician within 4 Hours (or PCP triage) ChadWest, Charity fundraiserN, Allstatemanda

## 2014-11-28 NOTE — Telephone Encounter (Signed)
FYI: Pt is on your schedule for tomorrow morning at 7:00

## 2014-11-28 NOTE — Telephone Encounter (Signed)
Pt refused to go to the ED as instructed.  Per Dr Dan HumphreysWalker okay to see pt tomorrow at 1:00.  Advised pt that if symptoms get worse before her appt tomorrow she must be seen at the ED.

## 2014-11-29 ENCOUNTER — Encounter: Payer: Self-pay | Admitting: Internal Medicine

## 2014-11-29 ENCOUNTER — Ambulatory Visit (INDEPENDENT_AMBULATORY_CARE_PROVIDER_SITE_OTHER): Payer: 59 | Admitting: Internal Medicine

## 2014-11-29 VITALS — BP 132/79 | HR 77 | Temp 98.0°F | Ht 64.5 in | Wt 131.1 lb

## 2014-11-29 DIAGNOSIS — F909 Attention-deficit hyperactivity disorder, unspecified type: Secondary | ICD-10-CM

## 2014-11-29 DIAGNOSIS — F988 Other specified behavioral and emotional disorders with onset usually occurring in childhood and adolescence: Secondary | ICD-10-CM

## 2014-11-29 DIAGNOSIS — F419 Anxiety disorder, unspecified: Secondary | ICD-10-CM

## 2014-11-29 DIAGNOSIS — R000 Tachycardia, unspecified: Secondary | ICD-10-CM

## 2014-11-29 LAB — TSH: TSH: 0.93 u[IU]/mL (ref 0.35–4.50)

## 2014-11-29 LAB — COMPREHENSIVE METABOLIC PANEL
ALT: 21 U/L (ref 0–35)
AST: 21 U/L (ref 0–37)
Albumin: 4.5 g/dL (ref 3.5–5.2)
Alkaline Phosphatase: 88 U/L (ref 39–117)
BILIRUBIN TOTAL: 0.4 mg/dL (ref 0.2–1.2)
BUN: 11 mg/dL (ref 6–23)
CO2: 31 mEq/L (ref 19–32)
CREATININE: 0.74 mg/dL (ref 0.40–1.20)
Calcium: 9.7 mg/dL (ref 8.4–10.5)
Chloride: 104 mEq/L (ref 96–112)
GFR: 87.12 mL/min (ref >60.00)
GLUCOSE: 123 mg/dL — AB (ref 70–99)
Potassium: 4.2 mEq/L (ref 3.5–5.1)
Sodium: 138 mEq/L (ref 135–145)
Total Protein: 6.6 g/dL (ref 6.0–8.3)

## 2014-11-29 LAB — CBC
HCT: 39.8 % (ref 36.0–46.0)
HEMOGLOBIN: 13.6 g/dL (ref 12.0–15.0)
MCHC: 34.1 g/dL (ref 30.0–36.0)
MCV: 90.6 fl (ref 78.0–100.0)
PLATELETS: 256 10*3/uL (ref 150.0–400.0)
RBC: 4.39 Mil/uL (ref 3.87–5.11)
RDW: 13.8 % (ref 11.5–15.5)
WBC: 8.1 10*3/uL (ref 4.0–10.5)

## 2014-11-29 NOTE — Assessment & Plan Note (Signed)
Hold any stimulant medications until cardiology evaluation complete.

## 2014-11-29 NOTE — Assessment & Plan Note (Signed)
Recent worsening anxiety. Will hold Celexa for now, until etiology of palpitations determined. Will continue prn Clonazepam.

## 2014-11-29 NOTE — Progress Notes (Signed)
Pre visit review using our clinic review tool, if applicable. No additional management support is needed unless otherwise documented below in the visit note. 

## 2014-11-29 NOTE — Progress Notes (Signed)
Subjective:    Patient ID: Dana Benson, female    DOB: 15-Sep-1961, 54 y.o.   MRN: [id number]  HPI  53YO female presents for acute visit.  Thursday last week had rapid, forceful heartbeats. She thought this might be from Anxiety. Had a glass of wine at dinner and symptoms improved. Friday, felt tired. Legs felt week when walking out to her car. Forceful beats started again. Symptoms continued during evening. Saturday, did not feel well. Felt tired. Arms felt weak. Felt short of breath at times. Felt "winded." Continued to have forceful heart beats. Worse throughout day. Felt like a "jolt" through her chest. Sunday was slightly better, but felt tired.  ADD - Never started Strattera. Was worried about side effects.  Anxiety - Never started Celexa. Continues to use Clonazepam at night to help with anxiety.  Past medical, surgical, family and social history per today's encounter.  Review of Systems  Constitutional: Negative for fever, chills, appetite change, fatigue and unexpected weight change.  Eyes: Negative for visual disturbance.  Respiratory: Positive for shortness of breath. Negative for chest tightness.   Cardiovascular: Positive for palpitations. Negative for chest pain and leg swelling.  Gastrointestinal: Negative for abdominal pain.  Skin: Negative for color change and rash.  Hematological: Negative for adenopathy. Does not bruise/bleed easily.  Psychiatric/Behavioral: Negative for dysphoric mood. The patient is nervous/anxious.        Objective:    BP 132/79 mmHg  Pulse 77  Temp(Src) 98 F (36.7 C) (Oral)  Ht 5' 4.5" (1.638 m)  Wt 131 lb 2 oz (59.478 kg)  BMI 22.17 kg/m2  SpO2 100% Physical Exam  Constitutional: She is oriented to person, place, and time. She appears well-developed and well-nourished. No distress.  HENT:  Head: Normocephalic and atraumatic.  Right Ear: External ear normal.  Left Ear: External ear normal.  Nose: Nose normal.    Mouth/Throat: Oropharynx is clear and moist. No oropharyngeal exudate.  Eyes: Conjunctivae are normal. Pupils are equal, round, and reactive to light. Right eye exhibits no discharge. Left eye exhibits no discharge. No scleral icterus.  Neck: Normal range of motion. Neck supple. No tracheal deviation present. No thyromegaly present.  Cardiovascular: Normal rate, regular rhythm, normal heart sounds and intact distal pulses.  Exam reveals no gallop and no friction rub.   No murmur heard. Pulmonary/Chest: Effort normal and breath sounds normal. No respiratory distress. She has no wheezes. She has no rales. She exhibits no tenderness.  Musculoskeletal: Normal range of motion. She exhibits no edema or tenderness.  Lymphadenopathy:    She has no cervical adenopathy.  Neurological: She is alert and oriented to person, place, and time. No cranial nerve deficit. She exhibits normal muscle tone. Coordination normal.  Skin: Skin is warm and dry. No rash noted. She is not diaphoretic. No erythema. No pallor.  Psychiatric: She has a normal mood and affect. Her behavior is normal. Judgment and thought content normal.          Assessment & Plan:   Problem List Items Addressed This Visit      Unprioritized   ADD (attention deficit disorder)    Hold any stimulant medications until cardiology evaluation complete.      Anxiety    Recent worsening anxiety. Will hold Celexa for now, until etiology of palpitations determined. Will continue prn Clonazepam.      Rapid heart rate - Primary    Recent symptoms of rapid forceful heart rate. Symptoms concerning for SVT. Will set  up cardiology evaluation. Labs today including CMP, TSH, CBC.      Relevant Orders   EKG 12-Lead (Completed)   Comprehensive metabolic panel   TSH   CBC   Ambulatory referral to Cardiology       Return in about 4 weeks (around 12/27/2014) for Recheck.

## 2014-11-29 NOTE — Assessment & Plan Note (Signed)
Recent symptoms of rapid forceful heart rate. Symptoms concerning for SVT. Will set up cardiology evaluation. Labs today including CMP, TSH, CBC.

## 2014-11-29 NOTE — Patient Instructions (Signed)
Stop Celexa and Strattera.  Labs today.  We will set up an evaluation with cardiology.  Follow up in 4 weeks.

## 2014-11-30 ENCOUNTER — Telehealth: Payer: Self-pay | Admitting: Internal Medicine

## 2014-11-30 NOTE — Telephone Encounter (Signed)
emmi emailed °

## 2014-12-12 ENCOUNTER — Encounter: Payer: Self-pay | Admitting: Cardiovascular Disease

## 2014-12-12 ENCOUNTER — Ambulatory Visit (INDEPENDENT_AMBULATORY_CARE_PROVIDER_SITE_OTHER): Payer: 59 | Admitting: Cardiovascular Disease

## 2014-12-12 VITALS — BP 106/72 | HR 69 | Ht 64.0 in | Wt 130.2 lb

## 2014-12-12 DIAGNOSIS — I499 Cardiac arrhythmia, unspecified: Secondary | ICD-10-CM

## 2014-12-12 DIAGNOSIS — R0602 Shortness of breath: Secondary | ICD-10-CM

## 2014-12-12 DIAGNOSIS — R002 Palpitations: Secondary | ICD-10-CM

## 2014-12-12 NOTE — Assessment & Plan Note (Signed)
Due to exertional dyspnea and fatigue, I recommend evaluation with a stress echocardiogram.

## 2014-12-12 NOTE — Assessment & Plan Note (Signed)
It is possible that some of her symptoms were triggered by stress and anxiety. SVT or premature beats cannot be completely excluded. I requested a 48-hour Holter monitor.

## 2014-12-12 NOTE — Patient Instructions (Signed)
Your physician has requested that you have a stress echocardiogram. For further information please visit [email or web address]. Please follow instruction sheet as given. -take your meds -eat a small meal  -comfortable cloths and walking shoes  -no lotion on the skin   Your physician has recommended that you wear a holter monitor. Holter monitors are medical devices that record the heart's electrical activity. Doctors most often use these monitors to diagnose arrhythmias. Arrhythmias are problems with the speed or rhythm of the heartbeat. The monitor is a small, portable device. You can wear one while you do your normal daily activities. This is usually used to diagnose what is causing palpitations/syncope (passing out).   Your physician recommends that you schedule a follow-up appointment in:  As needed

## 2014-12-12 NOTE — Progress Notes (Signed)
Primary care physician: Dr. Dan Humphreys  HPI  This is a pleasant 54 year old female who was referred for evaluation of palpitations. She has no previous cardiac history. She has known history of depression, anxiety, ADD and tobacco use. She has family history of coronary artery disease but not prematurely. She has been under significant stress lately. She had recent three-day stretch of palpitations described as fast heartbeats followed by a jolt feeling in her chest. She could not sleep and she felt tense all over. She was exhausted and anxious throughout the time. This happened after working 13 days straight. She complains of exertional dyspnea without chest pain. She has taken Adderall in the past but not recently. No syncope or presyncope.  Allergies  Allergen Reactions  . Prozac [Fluoxetine Hcl] Other (See Comments)    GI ISSUES  . Wellbutrin [Bupropion] Other (See Comments)    Dizziness and ? Hearing loss  . Prednisone Anxiety     Current Outpatient Prescriptions on File Prior to Visit  Medication Sig Dispense Refill  . clonazePAM (KLONOPIN) 0.5 MG tablet Take 1 tablet (0.5 mg total) by mouth at bedtime. 30 tablet 3  . cyanocobalamin (,VITAMIN B-12,) 1000 MCG/ML injection Inject 1 mL (1000 mcg) into muscle every 3 weeks. 3 mL 6  . Linaclotide (LINZESS) 290 MCG CAPS capsule Take 1 capsule (290 mcg total) by mouth daily. 30 capsule 3   No current facility-administered medications on file prior to visit.     Past Medical History  Diagnosis Date  . History of chicken pox   . Depression   . GERD (gastroesophageal reflux disease)   . Allergic rhinitis   . ADD (attention deficit disorder with hyperactivity)   . Anxiety      Past Surgical History  Procedure Laterality Date  . Vaginal hysterectomy  1995  . Tonsillectomy  1970  . Appendectomy  1979  . Bladder suspension  1995    during hysterectomy  . Cholecystectomy  1985     Family History  Problem Relation Age of Onset    . Depression Mother   . Depression Son   . Depression Maternal Aunt      History   Social History  . Marital Status: Single    Spouse Name: N/A  . Number of Children: 3  . Years of Education: N/A   Occupational History  .     Social History Main Topics  . Smoking status: Current Every Day Smoker -- 0.50 packs/day for 20 years    Types: Cigarettes  . Smokeless tobacco: Never Used     Comment: 2 packs a week  . Alcohol Use: 0.6 oz/week    1 Glasses of wine per week     Comment: Wine HS occasional  . Drug Use: No  . Sexual Activity: Not on file   Other Topics Concern  . Not on file   Social History Narrative   Lives in [place]. Works at [employer].     ROS A 10 point review of system was performed. It is negative other than that mentioned in the history of present illness.   PHYSICAL EXAM   BP 106/72 mmHg  Pulse 69  Ht  (1.626 m)  Wt 130 lb 4 oz (59.081 kg)  BMI 22.35 kg/m2 Constitutional: She is oriented to person, place, and time. She appears well-developed and well-nourished. No distress.  HENT: No nasal discharge.  Head: Normocephalic and atraumatic.  Eyes: Pupils are equal and round. No discharge.  Neck: Normal range  of motion. Neck supple. No JVD present. No thyromegaly present.  Cardiovascular: Normal rate, regular rhythm, normal heart sounds. Exam reveals no gallop and no friction rub. No murmur heard.  Pulmonary/Chest: Effort normal and breath sounds normal. No stridor. No respiratory distress. She has no wheezes. She has no rales. She exhibits no tenderness.  Abdominal: Soft. Bowel sounds are normal. She exhibits no distension. There is no tenderness. There is no rebound and no guarding.  Musculoskeletal: Normal range of motion. She exhibits no edema and no tenderness.  Neurological: She is alert and oriented to person, place, and time. Coordination normal.  Skin: Skin is warm and dry. No rash noted. She is not diaphoretic. No erythema. No pallor.   Psychiatric: She has a normal mood and affect. Her behavior is normal. Judgment and thought content normal.     ZOX:WRUEAEKG:Sinus  Rhythm  -Right bundle branch block.   ABNORMAL     ASSESSMENT AND PLAN

## 2014-12-28 ENCOUNTER — Ambulatory Visit (INDEPENDENT_AMBULATORY_CARE_PROVIDER_SITE_OTHER): Payer: 59 | Admitting: Internal Medicine

## 2014-12-28 ENCOUNTER — Encounter: Payer: Self-pay | Admitting: Internal Medicine

## 2014-12-28 VITALS — BP 115/73 | HR 80 | Temp 98.1°F | Ht 64.5 in | Wt 134.0 lb

## 2014-12-28 DIAGNOSIS — R000 Tachycardia, unspecified: Secondary | ICD-10-CM

## 2014-12-28 DIAGNOSIS — F988 Other specified behavioral and emotional disorders with onset usually occurring in childhood and adolescence: Secondary | ICD-10-CM

## 2014-12-28 DIAGNOSIS — F419 Anxiety disorder, unspecified: Secondary | ICD-10-CM | POA: Diagnosis not present

## 2014-12-28 DIAGNOSIS — F909 Attention-deficit hyperactivity disorder, unspecified type: Secondary | ICD-10-CM | POA: Diagnosis not present

## 2014-12-28 MED ORDER — CLONAZEPAM 0.5 MG PO TABS: 0.5000 mg | ORAL_TABLET | Freq: Three times a day (TID) | ORAL | Status: DC | PRN

## 2014-12-28 NOTE — Patient Instructions (Signed)
We will set up evaluation with psychiatry.  Increase Clonazepam to 1mg  at bedtime and 0.5 mg during day as needed.

## 2014-12-28 NOTE — Assessment & Plan Note (Signed)
Recent symptoms of forceful, rapid heart rate. Reviewed notes from cardiology. Concern for SVT. Holter is pending. Stress test pending.

## 2014-12-28 NOTE — Progress Notes (Signed)
Pre visit review using our clinic review tool, if applicable. No additional management support is needed unless otherwise documented below in the visit note. 

## 2014-12-28 NOTE — Progress Notes (Signed)
Subjective:    Patient ID: Dana MayhewLinda M Rollo, female    DOB: 06/03/1961, 54 y.o.   MRN: [id number]  HPI  54YO female presents for follow up.  Last seen 3/1 for ADD, anxiety, and tachycardia. Seen by cardiology and scheduled for 48hr Holter. Also scheduled for stress test on 4/18.  Feeling more anxious. Notes work stressors. Tearful at times. Concerned about weight gain from stress.  Would like to start back on Adderrall.  Seen by Dr. Maryruth BunKapur in psychiatry in the past, however has not recently been seen.  Not sleeping well. Was previously taking Clonazepam 1mg  at bedtime, and recent prescription cut her back to 0.5mg . Having trouble both falling asleep and staying asleep.  Wt Readings from Last 3 Encounters:  12/28/14 134 lb (60.782 kg)  12/12/14 130 lb 4 oz (59.081 kg)  11/29/14 131 lb 2 oz (59.478 kg)    Past medical, surgical, family and social history per today's encounter.  Review of Systems  Constitutional: Positive for fatigue. Negative for fever, chills, appetite change and unexpected weight change.  Eyes: Negative for visual disturbance.  Respiratory: Negative for shortness of breath.   Cardiovascular: Positive for palpitations. Negative for chest pain and leg swelling.  Gastrointestinal: Negative for abdominal pain.  Skin: Negative for color change and rash.  Hematological: Negative for adenopathy. Does not bruise/bleed easily.  Psychiatric/Behavioral: Positive for sleep disturbance. Negative for dysphoric mood. The patient is not nervous/anxious.        Objective:    BP 115/73 mmHg  Pulse 80  Temp(Src) 98.1 F (36.7 C) (Oral)  Ht 5' 4.5" (1.638 m)  Wt 134 lb (60.782 kg)  BMI 22.65 kg/m2  SpO2 100% Physical Exam  Constitutional: She is oriented to person, place, and time. She appears well-developed and well-nourished. No distress.  HENT:  Head: Normocephalic and atraumatic.  Right Ear: External ear normal.  Left Ear: External ear normal.  Nose: Nose  normal.  Mouth/Throat: Oropharynx is clear and moist. No oropharyngeal exudate.  Eyes: Conjunctivae are normal. Pupils are equal, round, and reactive to light. Right eye exhibits no discharge. Left eye exhibits no discharge. No scleral icterus.  Neck: Normal range of motion. Neck supple. No tracheal deviation present. No thyromegaly present.  Cardiovascular: Normal rate, regular rhythm, normal heart sounds and intact distal pulses.  Exam reveals no gallop and no friction rub.   No murmur heard. Pulmonary/Chest: Effort normal and breath sounds normal. No respiratory distress. She has no wheezes. She has no rales. She exhibits no tenderness.  Musculoskeletal: Normal range of motion. She exhibits no edema or tenderness.  Lymphadenopathy:    She has no cervical adenopathy.  Neurological: She is alert and oriented to person, place, and time. No cranial nerve deficit. She exhibits normal muscle tone. Coordination normal.  Skin: Skin is warm and dry. No rash noted. She is not diaphoretic. No erythema. No pallor.  Psychiatric: Her speech is normal and behavior is normal. Judgment and thought content normal. Her mood appears anxious. Cognition and memory are normal. She exhibits a depressed mood. She expresses no suicidal ideation.          Assessment & Plan:   Problem List Items Addressed This Visit      Unprioritized   ADD (attention deficit disorder)    ADD on adderrall for last few years. Recently having some palpitations concerning for SVT. Holding stimulant medications until cardiology evaluation with stress test and Holter complete. We tried alternative meds including Strattera and Vyvanse with  no improvement. Will set up psychiatry evaluation. Would prefer to keep off stimulants if possible.      Anxiety    Significant long-standing anxiety. Currently off Celexa because of weight gain. Will set up psychiatry evaluation with Dr. Maryruth Bun.      Relevant Orders   Ambulatory referral to  Psychiatry   Rapid heart rate - Primary    Recent symptoms of forceful, rapid heart rate. Reviewed notes from cardiology. Concern for SVT. Holter is pending. Stress test pending.          Return in about 4 weeks (around 01/25/2015) for Recheck.

## 2014-12-28 NOTE — Assessment & Plan Note (Signed)
ADD on adderrall for last few years. Recently having some palpitations concerning for SVT. Holding stimulant medications until cardiology evaluation with stress test and Holter complete. We tried alternative meds including Strattera and Vyvanse with no improvement. Will set up psychiatry evaluation. Would prefer to keep off stimulants if possible.

## 2014-12-28 NOTE — Assessment & Plan Note (Signed)
Significant long-standing anxiety. Currently off Celexa because of weight gain. Will set up psychiatry evaluation with Dr. Maryruth BunKapur.

## 2014-12-29 NOTE — Progress Notes (Signed)
Spoke with Dana Benson and she mentioned that pt will be contacted today for placement appointment scheduled either for tomorrow or sometime early next week depending on pt schedule.

## 2015-01-02 DIAGNOSIS — R002 Palpitations: Secondary | ICD-10-CM

## 2015-01-16 ENCOUNTER — Ambulatory Visit (INDEPENDENT_AMBULATORY_CARE_PROVIDER_SITE_OTHER): Payer: 59

## 2015-01-16 DIAGNOSIS — R002 Palpitations: Secondary | ICD-10-CM

## 2015-01-16 DIAGNOSIS — R0602 Shortness of breath: Secondary | ICD-10-CM | POA: Diagnosis not present

## 2015-01-16 DIAGNOSIS — I499 Cardiac arrhythmia, unspecified: Secondary | ICD-10-CM

## 2015-01-18 ENCOUNTER — Telehealth: Payer: Self-pay

## 2015-01-18 NOTE — Telephone Encounter (Signed)
Pt would like stress test results and holter results

## 2015-01-19 NOTE — Telephone Encounter (Signed)
Normal stress test. I will review the Holter. Has it been done?

## 2015-01-19 NOTE — Telephone Encounter (Signed)
Pt states she has not had any of her medication for her ADD or antidepressant for 6 weeks and states she would like to know these results ASAP.

## 2015-01-19 NOTE — Telephone Encounter (Signed)
Reviewed results of stress test w/ pt.   Advised her that the results of holter are not available right now, but I will call her when these are available.  She is curious as to what is causing her sx.  Pt reports that she is under a lot of stress, her antidepressants & ADD meds are being held pending these results, and she is feeling "overwhelmed & chaotic".  She would appreciate a call w/ results as soon as they are read.

## 2015-01-20 NOTE — Telephone Encounter (Signed)
Reviewed results of holter monitor w/ pt:  "NSR w/ rare PACs & PVCs.  No significant arrythmias."  Pt verbalizes understanding and is appreciative of the call.

## 2015-01-20 NOTE — Telephone Encounter (Signed)
The holter is now with Dr. Kirke CorinArida to read!

## 2015-01-24 NOTE — Telephone Encounter (Signed)
This encounter was created in error - please disregard.

## 2015-01-26 ENCOUNTER — Telehealth: Payer: Self-pay

## 2015-01-26 NOTE — Telephone Encounter (Signed)
Tried to call back this number. It goes to a recording. Can we try to get another number for Dr. Mayford KnifeWilliams? Thanks

## 2015-01-26 NOTE — Telephone Encounter (Signed)
Dr.Towanda Hornstein from [facility] called and wanted to discuss this patient  Callback - [phone]

## 2015-01-31 ENCOUNTER — Encounter: Payer: Self-pay | Admitting: Internal Medicine

## 2015-01-31 ENCOUNTER — Ambulatory Visit (INDEPENDENT_AMBULATORY_CARE_PROVIDER_SITE_OTHER): Payer: 59 | Admitting: Internal Medicine

## 2015-01-31 VITALS — BP 109/72 | HR 72 | Temp 98.1°F | Ht 64.5 in | Wt 134.2 lb

## 2015-01-31 DIAGNOSIS — F988 Other specified behavioral and emotional disorders with onset usually occurring in childhood and adolescence: Secondary | ICD-10-CM

## 2015-01-31 DIAGNOSIS — R002 Palpitations: Secondary | ICD-10-CM | POA: Diagnosis not present

## 2015-01-31 DIAGNOSIS — F909 Attention-deficit hyperactivity disorder, unspecified type: Secondary | ICD-10-CM

## 2015-01-31 NOTE — Patient Instructions (Signed)
Follow up in 4 months for physical.

## 2015-01-31 NOTE — Assessment & Plan Note (Signed)
Symptoms well controlled now on Adderall and Effexor. Follow up with Dr. Mayford KnifeWilliams as scheduled.

## 2015-01-31 NOTE — Progress Notes (Signed)
Pre visit review using our clinic review tool, if applicable. No additional management support is needed unless otherwise documented below in the visit note. 

## 2015-01-31 NOTE — Assessment & Plan Note (Signed)
Symptoms have improved. Holter and ECHO were normal. Will monitor for any recurrent symptoms.

## 2015-01-31 NOTE — Progress Notes (Signed)
   Subjective:    Patient ID: Dana Benson, female    DOB: 12/27/1960, 54 y.o.   MRN: [id number]  HPI  53YO female presents for follow up.  Palpitations - Stress ECHO was normal. Holter monitor was normal. No recurrent symptoms.  ADHD - Started back on Adderrall and Effexor by psychiatry, Dr. Mayford KnifeWilliams. Feeling much better. Focusing better at work. Follow up is scheduled in 1 month. He plans to follow then every 3 months.    Past medical, surgical, family and social history per today's encounter.  Review of Systems  Constitutional: Negative for fever, chills, appetite change, fatigue and unexpected weight change.  Eyes: Negative for visual disturbance.  Respiratory: Negative for shortness of breath.   Cardiovascular: Negative for chest pain and leg swelling.  Gastrointestinal: Negative for abdominal pain.  Skin: Negative for color change and rash.  Hematological: Negative for adenopathy. Does not bruise/bleed easily.  Psychiatric/Behavioral: Positive for decreased concentration. Negative for sleep disturbance and dysphoric mood. The patient is nervous/anxious.        Objective:    BP 109/72 mmHg  Pulse 72  Temp(Src) 98.1 F (36.7 C) (Oral)  Ht 5' 4.5" (1.638 m)  Wt 134 lb 4 oz (60.895 kg)  BMI 22.70 kg/m2  SpO2 100% Physical Exam  Constitutional: She is oriented to person, place, and time. She appears well-developed and well-nourished. No distress.  HENT:  Head: Normocephalic and atraumatic.  Right Ear: External ear normal.  Left Ear: External ear normal.  Nose: Nose normal.  Mouth/Throat: Oropharynx is clear and moist. No oropharyngeal exudate.  Eyes: Conjunctivae are normal. Pupils are equal, round, and reactive to light. Right eye exhibits no discharge. Left eye exhibits no discharge. No scleral icterus.  Neck: Normal range of motion. Neck supple. No tracheal deviation present. No thyromegaly present.  Cardiovascular: Normal rate, regular rhythm, normal heart  sounds and intact distal pulses.  Exam reveals no gallop and no friction rub.   No murmur heard. Pulmonary/Chest: Effort normal and breath sounds normal. No respiratory distress. She has no wheezes. She has no rales. She exhibits no tenderness.  Musculoskeletal: Normal range of motion. She exhibits no edema or tenderness.  Lymphadenopathy:    She has no cervical adenopathy.  Neurological: She is alert and oriented to person, place, and time. No cranial nerve deficit. She exhibits normal muscle tone. Coordination normal.  Skin: Skin is warm and dry. No rash noted. She is not diaphoretic. No erythema. No pallor.  Psychiatric: Her speech is normal and behavior is normal. Judgment and thought content normal. Her mood appears anxious. Cognition and memory are normal.          Assessment & Plan:   Problem List Items Addressed This Visit      Unprioritized   ADD (attention deficit disorder) - Primary    Symptoms well controlled now on Adderall and Effexor. Follow up with Dr. Mayford KnifeWilliams as scheduled.      Palpitations    Symptoms have improved. Holter and ECHO were normal. Will monitor for any recurrent symptoms.          Return in about 4 months (around 06/03/2015) for Physical.

## 2015-02-02 ENCOUNTER — Other Ambulatory Visit: Payer: Self-pay

## 2015-02-02 ENCOUNTER — Encounter (INDEPENDENT_AMBULATORY_CARE_PROVIDER_SITE_OTHER): Payer: 59

## 2015-02-02 DIAGNOSIS — R002 Palpitations: Secondary | ICD-10-CM

## 2015-03-23 ENCOUNTER — Ambulatory Visit: Payer: Self-pay | Admitting: Psychiatry

## 2015-04-12 ENCOUNTER — Ambulatory Visit: Payer: 59 | Admitting: Psychiatry

## 2015-04-20 ENCOUNTER — Encounter: Payer: Self-pay | Admitting: Psychiatry

## 2015-04-20 ENCOUNTER — Ambulatory Visit (INDEPENDENT_AMBULATORY_CARE_PROVIDER_SITE_OTHER): Payer: 59 | Admitting: Psychiatry

## 2015-04-20 VITALS — BP 110/72 | HR 64 | Temp 97.2°F | Ht 65.0 in | Wt 132.6 lb

## 2015-04-20 DIAGNOSIS — F900 Attention-deficit hyperactivity disorder, predominantly inattentive type: Secondary | ICD-10-CM

## 2015-04-20 MED ORDER — VENLAFAXINE HCL ER 75 MG PO CP24: 75.0000 mg | ORAL_CAPSULE | Freq: Every day | ORAL | Status: DC

## 2015-04-20 MED ORDER — AMPHETAMINE-DEXTROAMPHETAMINE 15 MG PO TABS: 15.0000 mg | ORAL_TABLET | Freq: Two times a day (BID) | ORAL | Status: DC

## 2015-04-20 MED ORDER — CLONAZEPAM 0.5 MG PO TABS: 0.5000 mg | ORAL_TABLET | Freq: Three times a day (TID) | ORAL | Status: DC | PRN

## 2015-04-20 NOTE — Progress Notes (Signed)
BH MD/PA/NP OP Progress Note  04/20/2015 2:28 PM Dana Benson  MRN:  [id number]  Subjective:  Dana Benson returns a follow-up of her ADHD and major depressive disorder, recurrent. She states overall things have gone about the same. She states that her mood is generally been good. She states that she has gone days without her Effexor. She states she went to the beach and did not take her Effexor for about 3 days. She states also during that time she had forgotten her Klonopin and that not being able to sleep without her Klonopin. Says otherwise she continues to go to work and states that it is going fairly well. States she feels like her Adderall is not as effective at keeping her concentration and focus as it used to be. She states she's been on the same dose for years. Chief Complaint: not focused Chief Complaint    Follow-up; Medication Refill; ADD; Anxiety; Depression     Visit Diagnosis:  No diagnosis found.  Past Medical History:  Past Medical History  Diagnosis Date  . History of chicken pox   . Depression   . GERD (gastroesophageal reflux disease)   . Allergic rhinitis   . ADD (attention deficit disorder with hyperactivity)   . Anxiety     Past Surgical History  Procedure Laterality Date  . Vaginal hysterectomy  1995  . Tonsillectomy  1970  . Appendectomy  1979  . Bladder suspension  1995    during hysterectomy  . Cholecystectomy  1985   Family History:  Family History  Problem Relation Age of Onset  . Depression Mother   . Hypertension Mother   . Diabetes Mother   . Depression Son   . Depression Maternal Aunt   . Depression Sister   . Anxiety disorder Sister   . ADD / ADHD Sister    Social History:  History   Social History  . Marital Status: Single    Spouse Name: N/A  . Number of Children: 3  . Years of Education: N/A   Occupational History  .     Social History Main Topics  . Smoking status: Current Every Day Smoker -- 0.50 packs/day for 20 years   Types: Cigarettes  . Smokeless tobacco: Never Used     Comment: 2 packs a week  . Alcohol Use: 0.6 oz/week    1 Glasses of wine per week     Comment: Wine HS occasional  . Drug Use: No  . Sexual Activity: No   Other Topics Concern  . None   Social History Narrative   Lives in [place]. Works at [employer].   Additional History:   Assessment:   Musculoskeletal: Strength & Muscle Tone: within normal limits Gait & Station: normal Patient leans: N/A  Psychiatric Specialty Exam: HPI  Review of Systems  Psychiatric/Behavioral: Negative for depression, suicidal ideas, hallucinations, memory loss and substance abuse. The patient has insomnia (Response to medication.). The patient is not nervous/anxious.     Blood pressure 110/72, pulse 64, temperature 97.2 F (36.2 C), temperature source Tympanic, height  (1.651 m), weight 132 lb 9.6 oz (60.147 kg), SpO2 99 %.Body mass index is 22.07 kg/(m^2).  General Appearance: Well Groomed  Eye Contact:  Good  Speech:  Normal Rate  Volume:  Normal  Mood:  Good  Affect:  Congruent  Thought Process:  Linear and Logical  Orientation:  Full (Time, Place, and Person)  Thought Content:  Negative  Suicidal Thoughts:  No  Homicidal Thoughts:  No  Memory:  Immediate;   Good Recent;   Good Remote;   Good  Judgement:  Good  Insight:  Good  Psychomotor Activity:  Negative  Concentration:  Good  Recall:  Good  Fund of Knowledge: Good  Language: Good  Akathisia:  Negative  Handed:  Right unknown   AIMS (if indicated): N/A  Assets:  Communication Skills Desire for Improvement Vocational/Educational  ADL's:  Intact  Cognition: WNL  Sleep:  Good with medicine   Is the patient at risk to self?  No. Has the patient been a risk to self in the past 6 months?  No. Has the patient been a risk to self within the distant past?  No. Is the patient a risk to others?  No. Has the patient been a risk to others in the past 6 months?  No. Has the  patient been a risk to others within the distant past?  No.  Current Medications: Current Outpatient Prescriptions  Medication Sig Dispense Refill  . amphetamine-dextroamphetamine (ADDERALL) 15 MG tablet Take 1 tablet by mouth 2 (two) times daily. 60 tablet 0  . clonazePAM (KLONOPIN) 0.5 MG tablet Take 1 tablet (0.5 mg total) by mouth 3 (three) times daily as needed for anxiety. 90 tablet 2  . cyanocobalamin (,VITAMIN B-12,) 1000 MCG/ML injection Inject 1 mL (1000 mcg) into muscle every 3 weeks. 3 mL 6  . Linaclotide (LINZESS) 290 MCG CAPS capsule Take 1 capsule (290 mcg total) by mouth daily. 30 capsule 3  . venlafaxine XR (EFFEXOR-XR) 75 MG 24 hr capsule Take 1 capsule (75 mg total) by mouth daily. 30 capsule 2   No current facility-administered medications for this visit.    Medical Decision Making:  Established Problem, Stable/Improving (1), Review of Medication Regimen & Side Effects (2) and Review of New Medication or Change in Dosage (2)  Treatment Plan Summary:Medication management and Plan We'll continue the patient's Effexor 75 mg daily and will increase her Adderall from 10 mg twice a day to 50 mg twice a day. At the next visit we may discuss switching her over to an extended release formulation. Also she discussed that she might want to return to Paxil in the fall as it was helpful for her depression and she finds it sometimes gets worse in the fall. I discussed perhaps that she might want to consider augmenting her Effexor if she is able to tolerated. At this time there is no need to make any change she feels like she's been tolerating the Effexor XR well.  Patient will return in follow-up in 2 months. She's been encouraged call with any questions or concerns prior to her next point.   Wallace Going 04/20/2015, 2:28 PM

## 2015-04-27 ENCOUNTER — Other Ambulatory Visit: Payer: Self-pay | Admitting: Internal Medicine

## 2015-05-31 ENCOUNTER — Other Ambulatory Visit: Payer: Self-pay

## 2015-05-31 MED ORDER — AMPHETAMINE-DEXTROAMPHETAMINE 15 MG PO TABS: 15.0000 mg | ORAL_TABLET | Freq: Two times a day (BID) | ORAL | Status: DC

## 2015-05-31 NOTE — Telephone Encounter (Signed)
per lea, pt called and left a message that she needed a refill on adderall 

## 2015-05-31 NOTE — Telephone Encounter (Signed)
Prescription is ready for pickup. Thank you AW

## 2015-05-31 NOTE — Telephone Encounter (Signed)
pt came by office after work to pick up rx. rx was picked up by patient.

## 2015-06-09 ENCOUNTER — Encounter: Payer: 59 | Admitting: Internal Medicine

## 2015-06-13 ENCOUNTER — Encounter: Payer: 59 | Admitting: Internal Medicine

## 2015-06-22 ENCOUNTER — Ambulatory Visit: Payer: Self-pay | Admitting: Psychiatry

## 2015-06-28 ENCOUNTER — Encounter: Payer: Self-pay | Admitting: Internal Medicine

## 2015-06-28 ENCOUNTER — Ambulatory Visit (INDEPENDENT_AMBULATORY_CARE_PROVIDER_SITE_OTHER): Payer: 59 | Admitting: Internal Medicine

## 2015-06-28 ENCOUNTER — Encounter: Payer: Self-pay | Admitting: *Deleted

## 2015-06-28 VITALS — BP 141/89 | HR 77 | Temp 97.9°F | Ht 64.5 in | Wt 134.4 lb

## 2015-06-28 DIAGNOSIS — F418 Other specified anxiety disorders: Secondary | ICD-10-CM | POA: Diagnosis not present

## 2015-06-28 DIAGNOSIS — K589 Irritable bowel syndrome without diarrhea: Secondary | ICD-10-CM | POA: Diagnosis not present

## 2015-06-28 DIAGNOSIS — R197 Diarrhea, unspecified: Secondary | ICD-10-CM | POA: Insufficient documentation

## 2015-06-28 DIAGNOSIS — R1084 Generalized abdominal pain: Secondary | ICD-10-CM | POA: Diagnosis not present

## 2015-06-28 DIAGNOSIS — Z0000 Encounter for general adult medical examination without abnormal findings: Secondary | ICD-10-CM

## 2015-06-28 LAB — LIPID PANEL
CHOL/HDL RATIO: 3
Cholesterol: 231 mg/dL — ABNORMAL HIGH (ref 0–200)
HDL: 68.5 mg/dL (ref >39.00)
LDL Cholesterol: 146 mg/dL — ABNORMAL HIGH (ref 0–99)
NONHDL: 162.51
TRIGLYCERIDES: 85 mg/dL (ref 0.0–149.0)
VLDL: 17 mg/dL (ref 0.0–40.0)

## 2015-06-28 LAB — CBC WITH DIFFERENTIAL/PLATELET
BASOS ABS: 0 10*3/uL (ref 0.0–0.1)
BASOS PCT: 0.5 % (ref 0.0–3.0)
EOS ABS: 0.2 10*3/uL (ref 0.0–0.7)
Eosinophils Relative: 2.7 % (ref 0.0–5.0)
HCT: 40 % (ref 36.0–46.0)
Hemoglobin: 13.4 g/dL (ref 12.0–15.0)
Lymphocytes Relative: 32.8 % (ref 12.0–46.0)
Lymphs Abs: 2.6 10*3/uL (ref 0.7–4.0)
MCHC: 33.5 g/dL (ref 30.0–36.0)
MCV: 92.4 fl (ref 78.0–100.0)
MONO ABS: 0.4 10*3/uL (ref 0.1–1.0)
Monocytes Relative: 4.7 % (ref 3.0–12.0)
NEUTROS ABS: 4.7 10*3/uL (ref 1.4–7.7)
NEUTROS PCT: 59.3 % (ref 43.0–77.0)
PLATELETS: 280 10*3/uL (ref 150.0–400.0)
RBC: 4.33 Mil/uL (ref 3.87–5.11)
RDW: 13.2 % (ref 11.5–15.5)
WBC: 7.9 10*3/uL (ref 4.0–10.5)

## 2015-06-28 LAB — COMPREHENSIVE METABOLIC PANEL
ALT: 21 U/L (ref 0–35)
AST: 19 U/L (ref 0–37)
Albumin: 4.4 g/dL (ref 3.5–5.2)
Alkaline Phosphatase: 95 U/L (ref 39–117)
BILIRUBIN TOTAL: 0.4 mg/dL (ref 0.2–1.2)
BUN: 12 mg/dL (ref 6–23)
CALCIUM: 9.4 mg/dL (ref 8.4–10.5)
CO2: 28 meq/L (ref 19–32)
CREATININE: 0.76 mg/dL (ref 0.40–1.20)
Chloride: 101 mEq/L (ref 96–112)
GFR: 84.3 mL/min (ref >60.00)
GLUCOSE: 94 mg/dL (ref 70–99)
Potassium: 3.9 mEq/L (ref 3.5–5.1)
Sodium: 136 mEq/L (ref 135–145)
TOTAL PROTEIN: 6.4 g/dL (ref 6.0–8.3)

## 2015-06-28 LAB — TSH: TSH: 1.12 u[IU]/mL (ref 0.35–4.50)

## 2015-06-28 LAB — VITAMIN D 25 HYDROXY (VIT D DEFICIENCY, FRACTURES): VITD: 29.85 ng/mL — ABNORMAL LOW (ref 30.00–100.00)

## 2015-06-28 LAB — VITAMIN B12: VITAMIN B 12: 888 pg/mL (ref 211–911)

## 2015-06-28 NOTE — Assessment & Plan Note (Signed)
Recent worsening of IBS symptoms. Labs today including testing for Cdiff given recent antibiotic use. Discussed adding low dose SSRI, but will hold on testing.

## 2015-06-28 NOTE — Patient Instructions (Addendum)
Please submit a stool sample for testing for CDiff. Labs today. Follow up in 4 weeks.  Health Maintenance Adopting a healthy lifestyle and getting preventive care can go a long way to promote health and wellness. Talk with your health care provider about what schedule of regular examinations is right for you. This is a good chance for you to check in with your provider about disease prevention and staying healthy. In between checkups, there are plenty of things you can do on your own. Experts have done a lot of research about which lifestyle changes and preventive measures are most likely to keep you healthy. Ask your health care provider for more information. WEIGHT AND DIET  Eat a healthy diet  Be sure to include plenty of vegetables, fruits, low-fat dairy products, and lean protein.  Do not eat a lot of foods high in solid fats, added sugars, or salt.  Get regular exercise. This is one of the most important things you can do for your health.  Most adults should exercise for at least 150 minutes each week. The exercise should increase your heart rate and make you sweat (moderate-intensity exercise).  Most adults should also do strengthening exercises at least twice a week. This is in addition to the moderate-intensity exercise.  Maintain a healthy weight  Body mass index (BMI) is a measurement that can be used to identify possible weight problems. It estimates body fat based on height and weight. Your health care provider can help determine your BMI and help you achieve or maintain a healthy weight.  For females 98 years of age and older:   A BMI below 18.5 is considered underweight.  A BMI of 18.5 to 24.9 is normal.  A BMI of 25 to 29.9 is considered overweight.  A BMI of 30 and above is considered obese.  Watch levels of cholesterol and blood lipids  You should start having your blood tested for lipids and cholesterol at 54 years of age, then have this test every 5  years.  You may need to have your cholesterol levels checked more often if:  Your lipid or cholesterol levels are high.  You are older than 54 years of age.  You are at high risk for heart disease.  CANCER SCREENING   Lung Cancer  Lung cancer screening is recommended for adults 33-26 years old who are at high risk for lung cancer because of a history of smoking.  A yearly low-dose CT scan of the lungs is recommended for people who:  Currently smoke.  Have quit within the past 15 years.  Have at least a 30-pack-year history of smoking. A pack year is smoking an average of one pack of cigarettes a day for 1 year.  Yearly screening should continue until it has been 15 years since you quit.  Yearly screening should stop if you develop a health problem that would prevent you from having lung cancer treatment.  Breast Cancer  Practice breast self-awareness. This means understanding how your breasts normally appear and feel.  It also means doing regular breast self-exams. Let your health care provider know about any changes, no matter how small.  If you are in your 20s or 30s, you should have a clinical breast exam (CBE) by a health care provider every 1-3 years as part of a regular health exam.  If you are 60 or older, have a CBE every year. Also consider having a breast X-ray (mammogram) every year.  If you have a family  history of breast cancer, talk to your health care provider about genetic screening.  If you are at high risk for breast cancer, talk to your health care provider about having an MRI and a mammogram every year.  Breast cancer gene (BRCA) assessment is recommended for women who have family members with BRCA-related cancers. BRCA-related cancers include:  Breast.  Ovarian.  Tubal.  Peritoneal cancers.  Results of the assessment will determine the need for genetic counseling and BRCA1 and BRCA2 testing. Cervical Cancer Routine pelvic examinations to  screen for cervical cancer are no longer recommended for nonpregnant women who are considered low risk for cancer of the pelvic organs (ovaries, uterus, and vagina) and who do not have symptoms. A pelvic examination may be necessary if you have symptoms including those associated with pelvic infections. Ask your health care provider if a screening pelvic exam is right for you.   The Pap test is the screening test for cervical cancer for women who are considered at risk.  If you had a hysterectomy for a problem that was not cancer or a condition that could lead to cancer, then you no longer need Pap tests.  If you are older than 65 years, and you have had normal Pap tests for the past 10 years, you no longer need to have Pap tests.  If you have had past treatment for cervical cancer or a condition that could lead to cancer, you need Pap tests and screening for cancer for at least 20 years after your treatment.  If you no longer get a Pap test, assess your risk factors if they change (such as having a new sexual partner). This can affect whether you should start being screened again.  Some women have medical problems that increase their chance of getting cervical cancer. If this is the case for you, your health care provider may recommend more frequent screening and Pap tests.  The human papillomavirus (HPV) test is another test that may be used for cervical cancer screening. The HPV test looks for the virus that can cause cell changes in the cervix. The cells collected during the Pap test can be tested for HPV.  The HPV test can be used to screen women 57 years of age and older. Getting tested for HPV can extend the interval between normal Pap tests from three to five years.  An HPV test also should be used to screen women of any age who have unclear Pap test results.  After 54 years of age, women should have HPV testing as often as Pap tests.  Colorectal Cancer  This type of cancer can be  detected and often prevented.  Routine colorectal cancer screening usually begins at 54 years of age and continues through 54 years of age.  Your health care provider may recommend screening at an earlier age if you have risk factors for colon cancer.  Your health care provider may also recommend using home test kits to check for hidden blood in the stool.  A small camera at the end of a tube can be used to examine your colon directly (sigmoidoscopy or colonoscopy). This is done to check for the earliest forms of colorectal cancer.  Routine screening usually begins at age 66.  Direct examination of the colon should be repeated every 5-10 years through 54 years of age. However, you may need to be screened more often if early forms of precancerous polyps or small growths are found. Skin Cancer  Check your skin from  head to toe regularly.  Tell your health care provider about any new moles or changes in moles, especially if there is a change in a mole's shape or color.  Also tell your health care provider if you have a mole that is larger than the size of a pencil eraser.  Always use sunscreen. Apply sunscreen liberally and repeatedly throughout the day.  Protect yourself by wearing long sleeves, pants, a wide-brimmed hat, and sunglasses whenever you are outside. HEART DISEASE, DIABETES, AND HIGH BLOOD PRESSURE   Have your blood pressure checked at least every 1-2 years. High blood pressure causes heart disease and increases the risk of stroke.  If you are between 35 years and 50 years old, ask your health care provider if you should take aspirin to prevent strokes.  Have regular diabetes screenings. This involves taking a blood sample to check your fasting blood sugar level.  If you are at a normal weight and have a low risk for diabetes, have this test once every three years after 54 years of age.  If you are overweight and have a high risk for diabetes, consider being tested at a  younger age or more often. PREVENTING INFECTION  Hepatitis B  If you have a higher risk for hepatitis B, you should be screened for this virus. You are considered at high risk for hepatitis B if:  You were born in a country where hepatitis B is common. Ask your health care provider which countries are considered high risk.  Your parents were born in a high-risk country, and you have not been immunized against hepatitis B (hepatitis B vaccine).  You have HIV or AIDS.  You use needles to inject street drugs.  You live with someone who has hepatitis B.  You have had sex with someone who has hepatitis B.  You get hemodialysis treatment.  You take certain medicines for conditions, including cancer, organ transplantation, and autoimmune conditions. Hepatitis C  Blood testing is recommended for:  Everyone born from [birth year] through 1965.  Anyone with known risk factors for hepatitis C. Sexually transmitted infections (STIs)  You should be screened for sexually transmitted infections (STIs) including gonorrhea and chlamydia if:  You are sexually active and are younger than 54 years of age.  You are older than 54 years of age and your health care provider tells you that you are at risk for this type of infection.  Your sexual activity has changed since you were last screened and you are at an increased risk for chlamydia or gonorrhea. Ask your health care provider if you are at risk.  If you do not have HIV, but are at risk, it may be recommended that you take a prescription medicine daily to prevent HIV infection. This is called pre-exposure prophylaxis (PrEP). You are considered at risk if:  You are sexually active and do not regularly use condoms or know the HIV status of your partner(s).  You take drugs by injection.  You are sexually active with a partner who has HIV. Talk with your health care provider about whether you are at high risk of being infected with HIV. If you choose  to begin PrEP, you should first be tested for HIV. You should then be tested every 3 months for as long as you are taking PrEP.  PREGNANCY   If you are premenopausal and you may become pregnant, ask your health care provider about preconception counseling.  If you may become pregnant, take 400 to 800  micrograms (mcg) of folic acid every day.  If you want to prevent pregnancy, talk to your health care provider about birth control (contraception). OSTEOPOROSIS AND MENOPAUSE   Osteoporosis is a disease in which the bones lose minerals and strength with aging. This can result in serious bone fractures. Your risk for osteoporosis can be identified using a bone density scan.  If you are 70 years of age or older, or if you are at risk for osteoporosis and fractures, ask your health care provider if you should be screened.  Ask your health care provider whether you should take a calcium or vitamin D supplement to lower your risk for osteoporosis.  Menopause may have certain physical symptoms and risks.  Hormone replacement therapy may reduce some of these symptoms and risks. Talk to your health care provider about whether hormone replacement therapy is right for you.  HOME CARE INSTRUCTIONS   Schedule regular health, dental, and eye exams.  Stay current with your immunizations.   Do not use any tobacco products including cigarettes, chewing tobacco, or electronic cigarettes.  If you are pregnant, do not drink alcohol.  If you are breastfeeding, limit how much and how often you drink alcohol.  Limit alcohol intake to no more than 1 drink per day for nonpregnant women. One drink equals 12 ounces of beer, 5 ounces of wine, or 1 ounces of hard liquor.  Do not use street drugs.  Do not share needles.  Ask your health care provider for help if you need support or information about quitting drugs.  Tell your health care provider if you often feel depressed.  Tell your health care  provider if you have ever been abused or do not feel safe at home. Document Released: 04/01/2011 Document Revised: 01/31/2014 Document Reviewed: 08/18/2013 [facility] Patient Information 2015 [place], [place]. This information is not intended to replace advice given to you by your health care provider. Make sure you discuss any questions you have with your health care provider.

## 2015-06-28 NOTE — Progress Notes (Signed)
Subjective:    Patient ID: Dana Benson, female    DOB: June 20, 1961, 54 y.o.   MRN: [id number]  HPI  54YO female presents for annual exam.  Feeling generally tired. No CP, dyspnea, palpitations. Doesn't feel like herself.  Having some recent abdominal pain, last couple of weeks. Feels like a band of pressure. Initally left sided, now right sided. BMs have been loose over last 2 weeks. Several episodes per day. Stopped Linzess because of loose stool. No blood in stool or black stool. Feels more bloated. Not taking anything for this.  Recently treated for URI with prednisone for possible ear infection. No improvement so treated with Zpack. Off Zpack for about 2 weeks.   Wt Readings from Last 3 Encounters:  06/28/15 134 lb 6 oz (60.952 kg)  04/20/15 132 lb 9.6 oz (60.147 kg)  01/31/15 134 lb 4 oz (60.895 kg)   BP Readings from Last 3 Encounters:  06/28/15 141/89  04/20/15 110/72  01/31/15 109/72    Past Medical History  Diagnosis Date  . History of chicken pox   . Depression   . GERD (gastroesophageal reflux disease)   . Allergic rhinitis   . ADD (attention deficit disorder with hyperactivity)   . Anxiety    Family History  Problem Relation Age of Onset  . Depression Mother   . Hypertension Mother   . Diabetes Mother   . Depression Son   . Depression Maternal Aunt   . Depression Sister   . Anxiety disorder Sister   . ADD / ADHD Sister    Past Surgical History  Procedure Laterality Date  . Vaginal hysterectomy  1995  . Tonsillectomy  1970  . Appendectomy  1979  . Bladder suspension  1995    during hysterectomy  . Cholecystectomy  1985   Social History   Social History  . Marital Status: Single    Spouse Name: N/A  . Number of Children: 3  . Years of Education: N/A   Occupational History  .     Social History Main Topics  . Smoking status: Current Every Day Smoker -- 0.50 packs/day for 20 years    Types: Cigarettes  . Smokeless tobacco: Never Used       Comment: 2 packs a week  . Alcohol Use: 0.6 oz/week    1 Glasses of wine per week     Comment: Wine HS occasional  . Drug Use: No  . Sexual Activity: No   Other Topics Concern  . None   Social History Narrative   Lives in [place]. Works at [employer].    Review of Systems  Constitutional: Positive for appetite change. Negative for fever, chills, fatigue and unexpected weight change.  Eyes: Negative for visual disturbance.  Respiratory: Negative for shortness of breath.   Cardiovascular: Negative for chest pain and leg swelling.  Gastrointestinal: Positive for diarrhea and constipation. Negative for nausea, vomiting, abdominal pain, blood in stool and abdominal distention.  Genitourinary: Negative for dysuria, urgency, frequency, hematuria, flank pain and decreased urine volume.  Skin: Negative for color change and rash.  Hematological: Negative for adenopathy. Does not bruise/bleed easily.  Psychiatric/Behavioral: Negative for dysphoric mood. The patient is not nervous/anxious.        Objective:    BP 141/89 mmHg  Pulse 77  Temp(Src) 97.9 F (36.6 C) (Oral)  Ht 5' 4.5" (1.638 m)  Wt 134 lb 6 oz (60.952 kg)  BMI 22.72 kg/m2  SpO2 100% Physical Exam  Constitutional: She  is oriented to person, place, and time. She appears well-developed and well-nourished. No distress.  HENT:  Head: Normocephalic and atraumatic.  Right Ear: External ear normal.  Left Ear: External ear normal.  Nose: Nose normal.  Mouth/Throat: Oropharynx is clear and moist. No oropharyngeal exudate.  Eyes: Conjunctivae are normal. Pupils are equal, round, and reactive to light. Right eye exhibits no discharge. Left eye exhibits no discharge. No scleral icterus.  Neck: Normal range of motion. Neck supple. No tracheal deviation present. No thyromegaly present.  Cardiovascular: Normal rate, regular rhythm, normal heart sounds and intact distal pulses.  Exam reveals no gallop and no friction rub.   No  murmur heard. Pulmonary/Chest: Effort normal and breath sounds normal. No accessory muscle usage. No tachypnea. No respiratory distress. She has no decreased breath sounds. She has no wheezes. She has no rales. She exhibits no tenderness. Right breast exhibits no inverted nipple, no mass, no nipple discharge, no skin change and no tenderness. Left breast exhibits no inverted nipple, no mass, no nipple discharge, no skin change and no tenderness. Breasts are symmetrical.  Abdominal: Soft. Bowel sounds are normal. She exhibits distension (mild). She exhibits no mass. There is tenderness (mild diffuse). There is no rebound and no guarding.  Musculoskeletal: Normal range of motion. She exhibits no edema or tenderness.  Lymphadenopathy:    She has no cervical adenopathy.  Neurological: She is alert and oriented to person, place, and time. No cranial nerve deficit. She exhibits normal muscle tone. Coordination normal.  Skin: Skin is warm and dry. No rash noted. She is not diaphoretic. No erythema. No pallor.  Psychiatric: Her speech is normal and behavior is normal. Judgment and thought content normal. Her mood appears anxious. Cognition and memory are normal. She exhibits a depressed mood.          Assessment & Plan:   Problem List Items Addressed This Visit      Unprioritized   Depression with anxiety    Recent increased anxiety and depression. Encouraged follow up with Dr. Mayford Knife. Continue prn Clonazepam.      Diarrhea    2 weeks of watery stools. Most likely related to IBS, however given recent antibiotic use, will check CDiff.      Relevant Orders   Stool C-Diff Toxin Assay   Generalized abdominal pain    Generalized abdominal pain, moving over last few weeks. Most likely related to IBS, but given recent use of antibiotics and diarrhea, will check CDiff. CMP and CBC today. Recheck 2 weeks, if no improvement, then CT for evaluation.      IBS (irritable bowel syndrome)    Recent  worsening of IBS symptoms. Labs today including testing for Cdiff given recent antibiotic use. Discussed adding low dose SSRI, but will hold on testing.      Routine general medical examination at a health care facility - Primary    General medical exam normal except as noted. Mammogram ordered. PAP due in 2018. Colonoscopy UTD and reviewed. Flu vaccine through her work. Labs as ordered.      Relevant Orders   MM Digital Screening   TSH   CBC with Differential/Platelet   Comprehensive metabolic panel   Lipid panel   Vit D  25 hydroxy (rtn osteoporosis monitoring)   B12       Return in about 2 weeks (around 07/12/2015) for Recheck.

## 2015-06-28 NOTE — Assessment & Plan Note (Signed)
General medical exam normal except as noted. Mammogram ordered. PAP due in 2018. Colonoscopy UTD and reviewed. Flu vaccine through her work. Labs as ordered.

## 2015-06-28 NOTE — Progress Notes (Signed)
Pre visit review using our clinic review tool, if applicable. No additional management support is needed unless otherwise documented below in the visit note. 

## 2015-06-28 NOTE — Assessment & Plan Note (Signed)
Recent increased anxiety and depression. Encouraged follow up with Dr. Mayford Knife. Continue prn Clonazepam.

## 2015-06-28 NOTE — Assessment & Plan Note (Signed)
Generalized abdominal pain, moving over last few weeks. Most likely related to IBS, but given recent use of antibiotics and diarrhea, will check CDiff. CMP and CBC today. Recheck 2 weeks, if no improvement, then CT for evaluation.

## 2015-06-28 NOTE — Assessment & Plan Note (Signed)
2 weeks of watery stools. Most likely related to IBS, however given recent antibiotic use, will check CDiff.

## 2015-06-29 ENCOUNTER — Encounter: Payer: Self-pay | Admitting: Psychiatry

## 2015-06-29 ENCOUNTER — Ambulatory Visit (INDEPENDENT_AMBULATORY_CARE_PROVIDER_SITE_OTHER): Payer: 59 | Admitting: Psychiatry

## 2015-06-29 VITALS — BP 124/84 | HR 80 | Temp 97.9°F | Ht 64.5 in | Wt 134.2 lb

## 2015-06-29 DIAGNOSIS — F900 Attention-deficit hyperactivity disorder, predominantly inattentive type: Secondary | ICD-10-CM

## 2015-06-29 DIAGNOSIS — F331 Major depressive disorder, recurrent, moderate: Secondary | ICD-10-CM | POA: Diagnosis not present

## 2015-06-29 MED ORDER — DULOXETINE HCL 30 MG PO CPEP: 30.0000 mg | ORAL_CAPSULE | ORAL | Status: DC

## 2015-06-29 MED ORDER — CLONAZEPAM 0.5 MG PO TABS: 0.5000 mg | ORAL_TABLET | Freq: Three times a day (TID) | ORAL | Status: DC | PRN

## 2015-06-29 MED ORDER — ESZOPICLONE 1 MG PO TABS: 1.0000 mg | ORAL_TABLET | Freq: Every evening | ORAL | Status: DC | PRN

## 2015-06-29 MED ORDER — AMPHETAMINE-DEXTROAMPHETAMINE 15 MG PO TABS: 15.0000 mg | ORAL_TABLET | Freq: Two times a day (BID) | ORAL | Status: DC

## 2015-06-29 NOTE — Addendum Note (Signed)
Addended by: Kerin Salen on: 06/29/2015 03:56 PM   Modules accepted: Orders

## 2015-06-29 NOTE — Progress Notes (Addendum)
BH MD/PA/NP OP Progress Note  06/29/2015 3:22 PM Dana Benson  MRN:  [id number]  Subjective:  Dana Benson returns a follow-up of her ADHD and major depressive disorder, recurrent. She states today that she has been feeling down, crying for several days out of the week. At the time she'll cry for 2 hours. She states when things happen around or even petty things they become "big" and overwhelming to her. She states she's also had difficulty sleeping and difficulty getting things done. She states her largest amount of social life exist at work. She denies any suicidal ideation but states she feels depressed.  She states she had some prednisone treatment earlier this month and that caused some problems with her mood. She also states she stopped her Effexor because she felt the Effexor was making her sleepy. She states when she stopped the Effexor her daytime somnolence went away. Chief Complaint: crying Chief Complaint    Follow-up; Medication Refill; Fatigue; Stress; Depression; Anxiety     Visit Diagnosis:  No diagnosis found.  Past Medical History:  Past Medical History  Diagnosis Date  . History of chicken pox   . Depression   . GERD (gastroesophageal reflux disease)   . Allergic rhinitis   . ADD (attention deficit disorder with hyperactivity)   . Anxiety     Past Surgical History  Procedure Laterality Date  . Vaginal hysterectomy  1995  . Tonsillectomy  1970  . Appendectomy  1979  . Bladder suspension  1995    during hysterectomy  . Cholecystectomy  1985   Family History:  Family History  Problem Relation Age of Onset  . Depression Mother   . Hypertension Mother   . Diabetes Mother   . Depression Son   . Depression Maternal Aunt   . Depression Sister   . Anxiety disorder Sister   . ADD / ADHD Sister    Social History:  Social History   Social History  . Marital Status: Single    Spouse Name: N/A  . Number of Children: 3  . Years of Education: N/A   Occupational  History  .     Social History Main Topics  . Smoking status: Current Every Day Smoker -- 0.50 packs/day for 20 years    Types: Cigarettes  . Smokeless tobacco: Never Used     Comment: 2 packs a week  . Alcohol Use: 0.6 - 1.2 oz/week    1-2 Glasses of wine, 0 Shots of liquor, 0 Cans of beer per week     Comment: Wine HS occasional  . Drug Use: No  . Sexual Activity: No   Other Topics Concern  . None   Social History Narrative   Lives in [place]. Works at [employer].   Additional History:   Assessment:   Musculoskeletal: Strength & Muscle Tone: within normal limits Gait & Station: normal Patient leans: N/A  Psychiatric Specialty Exam: Depression        Associated symptoms include insomnia (Response to medication.).  Associated symptoms include no suicidal ideas.  Past medical history includes anxiety.   Anxiety Symptoms include insomnia (Response to medication.). Patient reports no nervous/anxious behavior or suicidal ideas.      Review of Systems  Psychiatric/Behavioral: Positive for depression. Negative for suicidal ideas, hallucinations, memory loss and substance abuse. The patient has insomnia (Response to medication.). The patient is not nervous/anxious.     Blood pressure 124/84, pulse 80, temperature 97.9 F (36.6 C), temperature source Tympanic, height 5' 4.5" (1.638  m), weight 134 lb 3.2 oz (60.873 kg), SpO2 99 %.Body mass index is 22.69 kg/(m^2).  General Appearance: Well Groomed  Eye Contact:  Good  Speech:  Normal Rate  Volume:  Normal  Mood:  Good  Affect:  Congruent  Thought Process:  Linear and Logical  Orientation:  Full (Time, Place, and Person)  Thought Content:  Negative  Suicidal Thoughts:  No  Homicidal Thoughts:  No  Memory:  Immediate;   Good Recent;   Good Remote;   Good  Judgement:  Good  Insight:  Good  Psychomotor Activity:  Negative  Concentration:  Good  Recall:  Good  Fund of Knowledge: Good  Language: Good  Akathisia:  Negative   Handed:  Right unknown   AIMS (if indicated): N/A  Assets:  Communication Skills Desire for Improvement Vocational/Educational  ADL's:  Intact  Cognition: WNL  Sleep:  Good with medicine   Is the patient at risk to self?  No. Has the patient been a risk to self in the past 6 months?  No. Has the patient been a risk to self within the distant past?  No. Is the patient a risk to others?  No. Has the patient been a risk to others in the past 6 months?  No. Has the patient been a risk to others within the distant past?  No.  Current Medications: Current Outpatient Prescriptions  Medication Sig Dispense Refill  . amphetamine-dextroamphetamine (ADDERALL) 15 MG tablet Take 1 tablet by mouth 2 (two) times daily. 60 tablet 0  . clonazePAM (KLONOPIN) 0.5 MG tablet Take 1 tablet (0.5 mg total) by mouth 3 (three) times daily as needed for anxiety. 90 tablet 2  . cyanocobalamin (,VITAMIN B-12,) 1000 MCG/ML injection INJECT 1 ML (1000 MCG) INTO MUSCLE EVERY 3 WEEKS. 3 mL 6  . fluticasone (FLONASE) 50 MCG/ACT nasal spray   3  . Linaclotide (LINZESS) 290 MCG CAPS capsule Take 1 capsule (290 mcg total) by mouth daily. 30 capsule 3  . DULoxetine (CYMBALTA) 30 MG capsule Take 1 capsule (30 mg total) by mouth every morning. 30 capsule 1  . eszopiclone (LUNESTA) 1 MG TABS tablet Take 1 tablet (1 mg total) by mouth at bedtime as needed for sleep. Take immediately before bedtime 30 tablet 1   No current facility-administered medications for this visit.    Medical Decision Making:  Established Problem, Stable/Improving (1), Review of Medication Regimen & Side Effects (2) and Review of New Medication or Change in Dosage (2)  Treatment Plan Summary:Medication management and Plan   ADHD- We'll continue Adderall 15 mg twice a day.  At the next visit we may discuss switching her over to an extended release formulation.   Major Depressive Disorder, recurrent, moderate-patient discontinued her Effexor due  to sedation. He states she is concerned about weight gain and thus I discussed with her the options of Wellbutrin or Cymbalta. However because Wellbutrin may not be effective for anxiety we will go with Cymbalta. Patient will start Cymbalta 30 mg in the morning. Risk and benefits of been discussed patient's able to consent.   Insomnia start some Lunesta 1 mg at bedtime as needed. Risk and benefits of been discussed patient's able to consent.  Patient will return in follow-up in 1 months. She's been encouraged call with any questions or concerns prior to her next point.   Addendum: After the appointment patient presented to [facility] pharmacy and pharmacy told her they did not receive a prescription for Cymbalta.  I've tried to send it over again electronically twice.   Wallace Going 06/29/2015, 3:22 PM

## 2015-06-30 ENCOUNTER — Other Ambulatory Visit: Payer: Self-pay | Admitting: Internal Medicine

## 2015-07-03 LAB — C. DIFFICILE GDH AND TOXIN A/B

## 2015-07-07 ENCOUNTER — Encounter: Payer: Self-pay | Admitting: Physician Assistant

## 2015-07-07 ENCOUNTER — Ambulatory Visit: Payer: Self-pay | Admitting: Physician Assistant

## 2015-07-07 VITALS — BP 134/80

## 2015-07-07 MED ORDER — CYANOCOBALAMIN 1000 MCG/ML IJ SOLN
1000.0000 ug | Freq: Once | INTRAMUSCULAR | Status: AC
  Administered 2015-07-07: 1000 ug via INTRAMUSCULAR

## 2015-07-12 ENCOUNTER — Ambulatory Visit (INDEPENDENT_AMBULATORY_CARE_PROVIDER_SITE_OTHER): Payer: 59 | Admitting: Internal Medicine

## 2015-07-12 ENCOUNTER — Encounter: Payer: Self-pay | Admitting: Internal Medicine

## 2015-07-12 VITALS — BP 120/76 | HR 91 | Temp 98.3°F | Ht 65.0 in | Wt 133.5 lb

## 2015-07-12 DIAGNOSIS — R197 Diarrhea, unspecified: Secondary | ICD-10-CM

## 2015-07-12 DIAGNOSIS — F418 Other specified anxiety disorders: Secondary | ICD-10-CM | POA: Diagnosis not present

## 2015-07-12 NOTE — Patient Instructions (Addendum)
Stop Linzess.  We will submit stool sample.  Start Vit D 2000units daily.  Follow up in 4 weeks.

## 2015-07-12 NOTE — Progress Notes (Signed)
Pre visit review using our clinic review tool, if applicable. No additional management support is needed unless otherwise documented below in the visit note. 

## 2015-07-12 NOTE — Progress Notes (Signed)
Subjective:    Patient ID: Dana Benson, female    DOB: 22-Jan-1961, 54 y.o.   MRN: [id number]  HPI  54YO female presents for follow up.  Seen 9/28 for abdominal pain, worsening anxiety/depression. Seen by psychiatrist 9/29. Started on Cymbalta. Also started on Lunesta to help with insomnia. Has not yet started the medication.  Continues to have diarrhea, described as watery up to 3-15 episodes per day. No blood in stool. Stopped the Linzess. Submitted a stool sample, however results are pending. Worse in mornings. No NV. Generally feels week. Appetite okay. Some spasms of abdominal pain intermittently.    Wt Readings from Last 3 Encounters:  07/12/15 133 lb 8 oz (60.555 kg)  06/29/15 134 lb 3.2 oz (60.873 kg)  06/28/15 134 lb 6 oz (60.952 kg)   BP Readings from Last 3 Encounters:  07/12/15 120/76  07/07/15 134/80  06/29/15 124/84    Past Medical History  Diagnosis Date  . History of chicken pox   . Depression   . GERD (gastroesophageal reflux disease)   . Allergic rhinitis   . ADD (attention deficit disorder with hyperactivity)   . Anxiety    Family History  Problem Relation Age of Onset  . Depression Mother   . Hypertension Mother   . Diabetes Mother   . Depression Son   . Depression Maternal Aunt   . Depression Sister   . Anxiety disorder Sister   . ADD / ADHD Sister    Past Surgical History  Procedure Laterality Date  . Vaginal hysterectomy  1995  . Tonsillectomy  1970  . Appendectomy  1979  . Bladder suspension  1995    during hysterectomy  . Cholecystectomy  1985   Social History   Social History  . Marital Status: Single    Spouse Name: N/A  . Number of Children: 3  . Years of Education: N/A   Occupational History  .     Social History Main Topics  . Smoking status: Current Every Day Smoker -- 0.50 packs/day for 20 years    Types: Cigarettes  . Smokeless tobacco: Never Used     Comment: 2 packs a week  . Alcohol Use: 0.6 - 1.2 oz/week      1-2 Glasses of wine, 0 Shots of liquor, 0 Cans of beer per week     Comment: Wine HS occasional  . Drug Use: No  . Sexual Activity: No   Other Topics Concern  . None   Social History Narrative   Lives in [place]. Works at [employer].    Review of Systems  Constitutional: Positive for fatigue. Negative for fever, chills, appetite change and unexpected weight change.  Eyes: Negative for visual disturbance.  Respiratory: Negative for shortness of breath.   Cardiovascular: Negative for chest pain and leg swelling.  Gastrointestinal: Positive for diarrhea. Negative for nausea, vomiting, abdominal pain, constipation and blood in stool.  Musculoskeletal: Negative for myalgias and arthralgias.  Skin: Negative for color change and rash.  Neurological: Negative for weakness.  Hematological: Negative for adenopathy. Does not bruise/bleed easily.  Psychiatric/Behavioral: Positive for sleep disturbance and dysphoric mood. The patient is nervous/anxious.        Objective:    BP 120/76 mmHg  Pulse 91  Temp(Src) 98.3 F (36.8 C) (Oral)  Ht  (1.651 m)  Wt 133 lb 8 oz (60.555 kg)  BMI 22.22 kg/m2  SpO2 97% Physical Exam  Constitutional: She is oriented to person, place, and time. She  appears well-developed and well-nourished. No distress.  HENT:  Head: Normocephalic and atraumatic.  Right Ear: External ear normal.  Left Ear: External ear normal.  Nose: Nose normal.  Mouth/Throat: Oropharynx is clear and moist. No oropharyngeal exudate.  Eyes: Conjunctivae and EOM are normal. Pupils are equal, round, and reactive to light. Right eye exhibits no discharge.  Neck: Normal range of motion. Neck supple. No thyromegaly present.  Cardiovascular: Normal rate, regular rhythm, normal heart sounds and intact distal pulses.  Exam reveals no gallop and no friction rub.   No murmur heard. Pulmonary/Chest: Effort normal. No respiratory distress. She has no wheezes. She has no rales.  Abdominal:  Soft. Bowel sounds are normal. She exhibits no distension and no mass. There is no tenderness. There is no rebound and no guarding.  Musculoskeletal: Normal range of motion. She exhibits no edema or tenderness.  Lymphadenopathy:    She has no cervical adenopathy.  Neurological: She is alert and oriented to person, place, and time. No cranial nerve deficit. Coordination normal.  Skin: Skin is warm and dry. No rash noted. She is not diaphoretic. No erythema. No pallor.  Psychiatric: She has a normal mood and affect. Her behavior is normal. Judgment and thought content normal.          Assessment & Plan:   Problem List Items Addressed This Visit      Unprioritized   Depression with anxiety    Persistent symptoms of depression and anxiety. Reviewed notes from Dr. Mayford KnifeWilliams. Encouraged her to start Cymbalta as directed.      Diarrhea - Primary    Persistent diarrhea. However, stool sample submitted was too firm for testing requirements for CDiff. Recommended repeat sample, however suspicion of CDiff low. Suspect IBS. Encouraged her to stop Linzess. Encouraged her to start Cymbalta. Discussed referral for colonoscopy if symptoms not improving.Follow up in 4 weeks.          Return in about 4 weeks (around 08/09/2015) for Recheck.

## 2015-07-12 NOTE — Assessment & Plan Note (Signed)
Persistent symptoms of depression and anxiety. Reviewed notes from Dr. Mayford KnifeWilliams. Encouraged her to start Cymbalta as directed.

## 2015-07-12 NOTE — Assessment & Plan Note (Signed)
Persistent diarrhea. However, stool sample submitted was too firm for testing requirements for CDiff. Recommended repeat sample, however suspicion of CDiff low. Suspect IBS. Encouraged her to stop Linzess. Encouraged her to start Cymbalta. Discussed referral for colonoscopy if symptoms not improving.Follow up in 4 weeks.

## 2015-07-20 ENCOUNTER — Ambulatory Visit: Payer: Self-pay | Admitting: Psychiatry

## 2015-07-28 ENCOUNTER — Ambulatory Visit: Payer: 59 | Admitting: Physician Assistant

## 2015-07-31 ENCOUNTER — Ambulatory Visit: Payer: Self-pay | Admitting: Physician Assistant

## 2015-07-31 DIAGNOSIS — E538 Deficiency of other specified B group vitamins: Secondary | ICD-10-CM

## 2015-07-31 MED ORDER — CYANOCOBALAMIN 1000 MCG/ML IJ SOLN
1000.0000 ug | Freq: Once | INTRAMUSCULAR | Status: AC
  Administered 2015-08-23: 1000 ug via INTRAMUSCULAR

## 2015-07-31 MED ORDER — CYANOCOBALAMIN 1000 MCG/ML IJ SOLN: 1000.0000 ug | Freq: Once | INTRAMUSCULAR | Status: DC

## 2015-08-07 ENCOUNTER — Ambulatory Visit: Payer: 59 | Attending: Internal Medicine

## 2015-08-09 ENCOUNTER — Ambulatory Visit: Payer: Self-pay | Admitting: Internal Medicine

## 2015-08-09 ENCOUNTER — Ambulatory Visit (INDEPENDENT_AMBULATORY_CARE_PROVIDER_SITE_OTHER): Payer: 59 | Admitting: Internal Medicine

## 2015-08-09 ENCOUNTER — Encounter: Payer: Self-pay | Admitting: Internal Medicine

## 2015-08-09 VITALS — BP 134/80 | HR 82 | Temp 97.5°F | Ht 65.0 in | Wt 136.4 lb

## 2015-08-09 DIAGNOSIS — F418 Other specified anxiety disorders: Secondary | ICD-10-CM | POA: Diagnosis not present

## 2015-08-09 DIAGNOSIS — K589 Irritable bowel syndrome without diarrhea: Secondary | ICD-10-CM | POA: Diagnosis not present

## 2015-08-09 MED ORDER — LUBIPROSTONE 24 MCG PO CAPS: 24.0000 ug | ORAL_CAPSULE | Freq: Every day | ORAL | Status: DC

## 2015-08-09 NOTE — Assessment & Plan Note (Signed)
Symptoms improved on Cymbalta however having worsening constipation. Encouraged her to discuss with her psychiatrist at visit tomorrow.

## 2015-08-09 NOTE — Progress Notes (Signed)
Pre visit review using our clinic review tool, if applicable. No additional management support is needed unless otherwise documented below in the visit note. 

## 2015-08-09 NOTE — Assessment & Plan Note (Signed)
Symptoms c/w known IBS. Will try adding Amitiza prn to help with constipation. Encouraged her to talk with her psychiatrist about changing SSRI to alternative to see if improvement in constipation. Follow up 4 weeks and prn.

## 2015-08-09 NOTE — Patient Instructions (Signed)
Start Amitiza 24mcg daily to help with constipation.   Follow up with Dr. Mayford KnifeWilliams as scheduled.

## 2015-08-09 NOTE — Progress Notes (Signed)
Subjective:    Patient ID: Dana Benson, female    DOB: 04-Mar-1961, 54 y.o.   MRN: [id number]  HPI 54YO female presents for follow up.  Recently seen for diarrhea and anxiety/depression.  Started on Cymbalta by psychiatry. Diarrhea resolved, but now having constipation. Has not yet followed up with psychiatry. Depression and anxiety improved.  Tried Linzess in the past for constipation with no benefit. Notes abdominal distension and weight gain with recent constipation. No NV.  Wt Readings from Last 3 Encounters:  08/09/15 136 lb 6 oz (61.859 kg)  07/12/15 133 lb 8 oz (60.555 kg)  06/29/15 134 lb 3.2 oz (60.873 kg)   BP Readings from Last 3 Encounters:  08/09/15 134/80  07/12/15 120/76  07/07/15 134/80    Past Medical History  Diagnosis Date  . History of chicken pox   . Depression   . GERD (gastroesophageal reflux disease)   . Allergic rhinitis   . ADD (attention deficit disorder with hyperactivity)   . Anxiety    Family History  Problem Relation Age of Onset  . Depression Mother   . Hypertension Mother   . Diabetes Mother   . Depression Son   . Depression Maternal Aunt   . Depression Sister   . Anxiety disorder Sister   . ADD / ADHD Sister    Past Surgical History  Procedure Laterality Date  . Vaginal hysterectomy  1995  . Tonsillectomy  1970  . Appendectomy  1979  . Bladder suspension  1995    during hysterectomy  . Cholecystectomy  1985   Social History   Social History  . Marital Status: Single    Spouse Name: N/A  . Number of Children: 3  . Years of Education: N/A   Occupational History  .     Social History Main Topics  . Smoking status: Current Every Day Smoker -- 0.50 packs/day for 20 years    Types: Cigarettes  . Smokeless tobacco: Never Used     Comment: 2 packs a week  . Alcohol Use: 0.6 - 1.2 oz/week    1-2 Glasses of wine, 0 Shots of liquor, 0 Cans of beer per week     Comment: Wine HS occasional  . Drug Use: No  . Sexual  Activity: No   Other Topics Concern  . None   Social History Narrative   Lives in [place]. Works at [employer].    Review of Systems  Constitutional: Negative for fever, chills, appetite change, fatigue and unexpected weight change.  HENT: Positive for congestion, postnasal drip, rhinorrhea and sneezing. Negative for sinus pressure.   Eyes: Negative for visual disturbance.  Respiratory: Negative for cough and shortness of breath.   Cardiovascular: Negative for chest pain and leg swelling.  Gastrointestinal: Positive for abdominal pain, constipation and abdominal distention. Negative for nausea, vomiting and diarrhea.  Skin: Negative for color change and rash.  Hematological: Negative for adenopathy. Does not bruise/bleed easily.  Psychiatric/Behavioral: Negative for sleep disturbance and dysphoric mood. The patient is not nervous/anxious.        Objective:    BP 134/80 mmHg  Pulse 82  Temp(Src) 97.5 F (36.4 C) (Oral)  Ht  (1.651 m)  Wt 136 lb 6 oz (61.859 kg)  BMI 22.69 kg/m2  SpO2 99% Physical Exam  Constitutional: She is oriented to person, place, and time. She appears well-developed and well-nourished. No distress.  HENT:  Head: Normocephalic and atraumatic.  Right Ear: External ear normal.  Left Ear:  External ear normal.  Nose: Nose normal.  Mouth/Throat: Oropharynx is clear and moist. No oropharyngeal exudate.  Eyes: Conjunctivae and EOM are normal. Pupils are equal, round, and reactive to light. Right eye exhibits no discharge.  Neck: Normal range of motion. Neck supple. No thyromegaly present.  Cardiovascular: Normal rate, regular rhythm, normal heart sounds and intact distal pulses.  Exam reveals no gallop and no friction rub.   No murmur heard. Pulmonary/Chest: Effort normal. No respiratory distress. She has no wheezes. She has no rales.  Abdominal: Soft. Bowel sounds are normal. She exhibits distension (diffuse). She exhibits no mass. There is no tenderness.  There is no rebound and no guarding.  Musculoskeletal: Normal range of motion. She exhibits no edema or tenderness.  Lymphadenopathy:    She has no cervical adenopathy.  Neurological: She is alert and oriented to person, place, and time. No cranial nerve deficit. Coordination normal.  Skin: Skin is warm and dry. No rash noted. She is not diaphoretic. No erythema. No pallor.  Psychiatric: She has a normal mood and affect. Her behavior is normal. Judgment and thought content normal.          Assessment & Plan:   Problem List Items Addressed This Visit      Unprioritized   Depression with anxiety    Symptoms improved on Cymbalta however having worsening constipation. Encouraged her to discuss with her psychiatrist at visit tomorrow.      IBS (irritable bowel syndrome) - Primary    Symptoms c/w known IBS. Will try adding Amitiza prn to help with constipation. Encouraged her to talk with her psychiatrist about changing SSRI to alternative to see if improvement in constipation. Follow up 4 weeks and prn.      Relevant Medications   lubiprostone (AMITIZA) 24 MCG capsule       Return in about 4 weeks (around 09/06/2015) for Recheck.

## 2015-08-10 ENCOUNTER — Encounter: Payer: Self-pay | Admitting: Psychiatry

## 2015-08-10 ENCOUNTER — Ambulatory Visit (INDEPENDENT_AMBULATORY_CARE_PROVIDER_SITE_OTHER): Payer: 59 | Admitting: Psychiatry

## 2015-08-10 VITALS — BP 142/88 | HR 96 | Temp 99.4°F | Ht 65.0 in | Wt 138.2 lb

## 2015-08-10 DIAGNOSIS — F900 Attention-deficit hyperactivity disorder, predominantly inattentive type: Secondary | ICD-10-CM | POA: Diagnosis not present

## 2015-08-10 DIAGNOSIS — F331 Major depressive disorder, recurrent, moderate: Secondary | ICD-10-CM | POA: Diagnosis not present

## 2015-08-10 MED ORDER — AMPHETAMINE-DEXTROAMPHETAMINE 15 MG PO TABS: 15.0000 mg | ORAL_TABLET | Freq: Two times a day (BID) | ORAL | Status: DC

## 2015-08-10 NOTE — Progress Notes (Signed)
BH MD/PA/NP OP Progress Note  08/10/2015 3:28 PM Dana Benson  MRN:  [id number]  Subjective:  Dana Benson returns a follow-up of her ADHD and major depressive disorder, recurrent. At the last visit the patient expressed and presented with symptoms consistent with depression. We made a decision to try Cymbalta. The patient stated she did not start taking the Cymbalta until around the middle of October. She states that probably within a 2 day she noticed that her mood had slightly improved. Also states that the crying has improved and she can't remember the last time she was crying which was an issue at her last visit. Specifically she stated that it appeared she could fight through being tired and fatigued better. She does state that one side effect she is having some constipation. However she saw her primary care and was given a prescription for Amitza to address constipation. Patient also reports she is irritable bowel syndrome and this may have also factored in to the decision to initiate that prescription. As per the last visit patient was very interested in antidepressants which were less likely to cause weight gain and she is aware Cymbalta is one of those options. Given that she is benefiting from it she has decided to continue with the Cymbalta despite the side effects.  He states she continues to have lack of interest in doing things. She was able to state that she went to a rib fast this past weekend. However she states she really would like to move towards getting out more and enjoying things. Chief Complaint: better  Visit Diagnosis:     ICD-9-CM ICD-10-CM   1. Major depressive disorder, recurrent episode, moderate (HCC) 296.32 F33.1   2. Attention deficit hyperactivity disorder (ADHD), predominantly inattentive type 314.01 F90.0     Past Medical History:  Past Medical History  Diagnosis Date  . History of chicken pox   . Depression   . GERD (gastroesophageal reflux disease)   .  Allergic rhinitis   . ADD (attention deficit disorder with hyperactivity)   . Anxiety     Past Surgical History  Procedure Laterality Date  . Vaginal hysterectomy  1995  . Tonsillectomy  1970  . Appendectomy  1979  . Bladder suspension  1995    during hysterectomy  . Cholecystectomy  1985   Family History:  Family History  Problem Relation Age of Onset  . Depression Mother   . Hypertension Mother   . Diabetes Mother   . Depression Son   . Depression Maternal Aunt   . Depression Sister   . Anxiety disorder Sister   . ADD / ADHD Sister    Social History:  Social History   Social History  . Marital Status: Single    Spouse Name: N/A  . Number of Children: 3  . Years of Education: N/A   Occupational History  .     Social History Main Topics  . Smoking status: Current Every Day Smoker -- 0.50 packs/day for 20 years    Types: Cigarettes  . Smokeless tobacco: Never Used     Comment: 2 packs a week  . Alcohol Use: 0.6 - 1.2 oz/week    1-2 Glasses of wine, 0 Shots of liquor, 0 Cans of beer per week     Comment: Wine HS occasional  . Drug Use: No  . Sexual Activity: No   Other Topics Concern  . Not on file   Social History Narrative   Lives in [place]. Works at  [employer].   Additional History:   Assessment:   Musculoskeletal: Strength & Muscle Tone: within normal limits Gait & Station: normal Patient leans: N/A  Psychiatric Specialty Exam: Depression        Associated symptoms include does not have insomnia and no suicidal ideas.  Past medical history includes anxiety.   Anxiety Patient reports no insomnia, nervous/anxious behavior or suicidal ideas.      Review of Systems  Psychiatric/Behavioral: Positive for depression (she reports this is improved from the last visit.). Negative for suicidal ideas, hallucinations, memory loss and substance abuse. The patient is not nervous/anxious and does not have insomnia.   All other systems reviewed and are  negative.   There were no vitals taken for this visit.There is no weight on file to calculate BMI.  General Appearance: Well Groomed  Eye Contact:  Good  Speech:  Normal Rate  Volume:  Normal  Mood:  Good  Affect:  Congruent  Thought Process:  Linear and Logical  Orientation:  Full (Time, Place, and Person)  Thought Content:  Negative  Suicidal Thoughts:  No  Homicidal Thoughts:  No  Memory:  Immediate;   Good Recent;   Good Remote;   Good  Judgement:  Good  Insight:  Good  Psychomotor Activity:  Negative  Concentration:  Good  Recall:  Good  Fund of Knowledge: Good  Language: Good  Akathisia:  Negative  Handed:  Right unknown   AIMS (if indicated): N/A  Assets:  Communication Skills Desire for Improvement Vocational/Educational  ADL's:  Intact  Cognition: WNL  Sleep:  Good with medicine   Is the patient at risk to self?  No. Has the patient been a risk to self in the past 6 months?  No. Has the patient been a risk to self within the distant past?  No. Is the patient a risk to others?  No. Has the patient been a risk to others in the past 6 months?  No. Has the patient been a risk to others within the distant past?  No.  Current Medications: Current Outpatient Prescriptions  Medication Sig Dispense Refill  . amphetamine-dextroamphetamine (ADDERALL) 15 MG tablet Take 1 tablet by mouth 2 (two) times daily. 60 tablet 0  . cetirizine (ZYRTEC) 10 MG tablet Take 10 mg by mouth daily.    . clonazePAM (KLONOPIN) 0.5 MG tablet Take 1 tablet (0.5 mg total) by mouth 3 (three) times daily as needed for anxiety. 90 tablet 2  . DULoxetine (CYMBALTA) 30 MG capsule Take 1 capsule (30 mg total) by mouth every morning. 30 capsule 1  . eszopiclone (LUNESTA) 1 MG TABS tablet Take 1 tablet (1 mg total) by mouth at bedtime as needed for sleep. Take immediately before bedtime 30 tablet 1  . fluticasone (FLONASE) 50 MCG/ACT nasal spray   3  . lubiprostone (AMITIZA) 24 MCG capsule Take 1  capsule (24 mcg total) by mouth daily with breakfast. 30 capsule 3   Current Facility-Administered Medications  Medication Dose Route Frequency Provider Last Rate Last Dose  . cyanocobalamin ((VITAMIN B-12)) injection 1,000 mcg  1,000 mcg Intramuscular Once Faythe GheeSusan W Fisher, PA-C        Medical Decision Making:  Established Problem, Stable/Improving (1), Review of Medication Regimen & Side Effects (2) and Review of New Medication or Change in Dosage (2)  Treatment Plan Summary:Medication management and Plan   ADHD- We'll continue Adderall 15 mg twice a day.  At the next visit we may discuss switching her over to  an extended release formulation.   Major Depressive Disorder, recurrent, moderate-patient reports some benefit from the Cymbalta 30 mg. We discussed that today she could increase her dose but that given she's been on it approximately 3 weeks she might see some further improvement at the current dose. She prefers to remain at the current dose.   Insomnia start some Lunesta 1 mg at bedtime as needed. Patient states that she has used it about 3 times and it did help her sleep about 4 hours each time but she had some daytime sedation afterwards.  Patient will return in follow-up in 1 months. She's been encouraged call with any questions or concerns prior to her next point.     Wallace Going 08/10/2015, 3:28 PM

## 2015-08-22 ENCOUNTER — Telehealth: Payer: Self-pay

## 2015-08-22 NOTE — Telephone Encounter (Signed)
spoke with patient, asked if she followed up with pcp about bp issues.  pt at her last visit on  08-10-15 bp was  142/88 .  pt states that she has not followed up with her primary care.  pt was advised to at least check her bp at least twice a day and if it is still high she was strongly advised to call pcp

## 2015-09-04 ENCOUNTER — Ambulatory Visit: Payer: Self-pay | Admitting: Physician Assistant

## 2015-09-04 ENCOUNTER — Encounter: Payer: Self-pay | Admitting: Physician Assistant

## 2015-09-04 DIAGNOSIS — D510 Vitamin B12 deficiency anemia due to intrinsic factor deficiency: Secondary | ICD-10-CM

## 2015-09-04 MED ORDER — CYANOCOBALAMIN 1000 MCG/ML IJ SOLN
1000.0000 ug | Freq: Once | INTRAMUSCULAR | Status: AC
  Administered 2015-09-04: 1000 ug via INTRAMUSCULAR

## 2015-09-04 NOTE — Progress Notes (Signed)
Here for b12 injection

## 2015-09-07 ENCOUNTER — Ambulatory Visit: Payer: 59 | Admitting: Psychiatry

## 2015-09-07 ENCOUNTER — Ambulatory Visit (INDEPENDENT_AMBULATORY_CARE_PROVIDER_SITE_OTHER): Payer: 59 | Admitting: Psychiatry

## 2015-09-07 ENCOUNTER — Encounter: Payer: Self-pay | Admitting: Psychiatry

## 2015-09-07 VITALS — BP 120/78 | HR 70 | Temp 98.4°F | Ht 65.0 in | Wt 137.4 lb

## 2015-09-07 DIAGNOSIS — F331 Major depressive disorder, recurrent, moderate: Secondary | ICD-10-CM | POA: Diagnosis not present

## 2015-09-07 DIAGNOSIS — F900 Attention-deficit hyperactivity disorder, predominantly inattentive type: Secondary | ICD-10-CM

## 2015-09-07 MED ORDER — CITALOPRAM HYDROBROMIDE 20 MG PO TABS: ORAL_TABLET | ORAL | Status: DC

## 2015-09-07 MED ORDER — CLONAZEPAM 0.5 MG PO TABS: 0.5000 mg | ORAL_TABLET | Freq: Three times a day (TID) | ORAL | Status: DC | PRN

## 2015-09-07 MED ORDER — AMPHETAMINE-DEXTROAMPHETAMINE 15 MG PO TABS: 15.0000 mg | ORAL_TABLET | Freq: Two times a day (BID) | ORAL | Status: DC

## 2015-09-07 NOTE — Progress Notes (Signed)
BH MD/PA/NP OP Progress Note  09/07/2015 11:50 AM Dana Benson  MRN:  [id number]  Subjective:  Dana Benson returns a follow-up of her ADHD and major depressive disorder, recurrent. Patient indicates that she continues to do fairly well on the Cymbalta. She states she is noticing some irritability in regards to her response to small things. However she states that she's noticed that large things that would typically bother her are not. She states there is some stress at work because there are fewer people and then she feels are needed to do the work. Nose is she's sleeping a lot sometimes 13 hours and still somewhat feeling tired.  Cymbalta has been very helpful for her mood however she continues to have constipation that is daily and has persisted since beginning the Cymbalta. Given the side effect of discuss that with her that it might be helpful to see if to find another antidepressant. Patient reports that she is responded well to Paxil in the past but had weight gain and she depresses that do not cause this. She states that she was on Wellbutrin and feels like it might of helped her but while she was on that she had some issues with dizziness and spells. She states that she stopped the Wellbutrin because she read it could cause this however she states that ENT diagnosed some type of virus in her ear and that may have actually been the cause even though Wellbutrin is now listed on her allergy list. She been on citalopram in the past with good results but states that she felt like it was effective, but not strong enough. She states her primary care had her on perhaps 20 mg and told her that was the highest dose. Given this we discussed that she could go on citalopram and at the maximum dose is 40. I also discussed that potentially she could've augmentation of the citalopram if it's effective. Chief Complaint: better Chief Complaint    Follow-up; Medication Refill     Visit Diagnosis:   No diagnosis  found.  Past Medical History:  Past Medical History  Diagnosis Date  . History of chicken pox   . Depression   . GERD (gastroesophageal reflux disease)   . Allergic rhinitis   . ADD (attention deficit disorder with hyperactivity)   . Anxiety     Past Surgical History  Procedure Laterality Date  . Vaginal hysterectomy  1995  . Tonsillectomy  1970  . Appendectomy  1979  . Bladder suspension  1995    during hysterectomy  . Cholecystectomy  1985   Family History:  Family History  Problem Relation Age of Onset  . Depression Mother   . Hypertension Mother   . Diabetes Mother   . Depression Son   . Depression Maternal Aunt   . Depression Sister   . Anxiety disorder Sister   . ADD / ADHD Sister    Social History:  Social History   Social History  . Marital Status: Single    Spouse Name: N/A  . Number of Children: 3  . Years of Education: N/A   Occupational History  .     Social History Main Topics  . Smoking status: Current Every Day Smoker -- 0.50 packs/day for 20 years    Types: Cigarettes  . Smokeless tobacco: Never Used     Comment: 2 packs a week  . Alcohol Use: 0.6 - 1.2 oz/week    1-2 Glasses of wine, 0 Shots of liquor, 0 Cans  of beer per week     Comment: Wine HS occasional  . Drug Use: No  . Sexual Activity: No   Other Topics Concern  . None   Social History Narrative   Lives in [place]. Works at [employer].   Additional History:   Assessment:   Musculoskeletal: Strength & Muscle Tone: within normal limits Gait & Station: normal Patient leans: N/A  Psychiatric Specialty Exam: Depression        Associated symptoms include does not have insomnia and no suicidal ideas.  Past medical history includes anxiety.   Anxiety Patient reports no insomnia, nervous/anxious behavior or suicidal ideas.      Review of Systems  Psychiatric/Behavioral: Positive for depression (overall this is improved on Cymbalta. Patient still states she'll have brief  moments of depression but nothing like prior to being on the Cymbalta.). Negative for suicidal ideas, hallucinations, memory loss and substance abuse. The patient is not nervous/anxious and does not have insomnia.   All other systems reviewed and are negative.   Blood pressure 120/78, pulse 70, temperature 98.4 F (36.9 C), temperature source Tympanic, height  (1.651 m), weight 137 lb 6.4 oz (62.324 kg), SpO2 99 %.Body mass index is 22.86 kg/(m^2).  General Appearance: Well Groomed  Eye Contact:  Good  Speech:  Normal Rate  Volume:  Normal  Mood:  Good  Affect:  Congruent  Thought Process:  Linear and Logical  Orientation:  Full (Time, Place, and Person)  Thought Content:  Negative  Suicidal Thoughts:  No  Homicidal Thoughts:  No  Memory:  Immediate;   Good Recent;   Good Remote;   Good  Judgement:  Good  Insight:  Good  Psychomotor Activity:  Negative  Concentration:  Good  Recall:  Good  Fund of Knowledge: Good  Language: Good  Akathisia:  Negative  Handed:  Right unknown   AIMS (if indicated): N/A  Assets:  Communication Skills Desire for Improvement Vocational/Educational  ADL's:  Intact  Cognition: WNL  Sleep:  Good with medicine   Is the patient at risk to self?  No. Has the patient been a risk to self in the past 6 months?  No. Has the patient been a risk to self within the distant past?  No. Is the patient a risk to others?  No. Has the patient been a risk to others in the past 6 months?  No. Has the patient been a risk to others within the distant past?  No.  Current Medications: Current Outpatient Prescriptions  Medication Sig Dispense Refill  . amphetamine-dextroamphetamine (ADDERALL) 15 MG tablet Take 1 tablet by mouth 2 (two) times daily. 60 tablet 0  . cetirizine (ZYRTEC) 10 MG tablet Take 10 mg by mouth daily.    . clonazePAM (KLONOPIN) 0.5 MG tablet Take 1 tablet (0.5 mg total) by mouth 3 (three) times daily as needed for anxiety. 90 tablet 4  .  cyanocobalamin (,VITAMIN B-12,) 1000 MCG/ML injection   6  . eszopiclone (LUNESTA) 1 MG TABS tablet Take 1 tablet (1 mg total) by mouth at bedtime as needed for sleep. Take immediately before bedtime 30 tablet 1  . fluticasone (FLONASE) 50 MCG/ACT nasal spray   3  . lubiprostone (AMITIZA) 24 MCG capsule Take 1 capsule (24 mcg total) by mouth daily with breakfast. 30 capsule 3  . citalopram (CELEXA) 20 MG tablet Take one half a tablet in the morning for seven days and then increase to one whole tablet in the morning. 30 tablet  1   Current Facility-Administered Medications  Medication Dose Route Frequency Provider Last Rate Last Dose  . cyanocobalamin ((VITAMIN B-12)) injection 1,000 mcg  1,000 mcg Intramuscular Once Faythe Ghee, PA-C        Medical Decision Making:  Established Problem, Stable/Improving (1), Review of Medication Regimen & Side Effects (2) and Review of New Medication or Change in Dosage (2)  Treatment Plan Summary:Medication management and Plan   ADHD- We'll continue Adderall 15 mg twice a day.  At the next visit we may discuss switching her over to an extended release formulation.   Major Depressive Disorder, recurrent, moderate-even the ongoing issues with constipation related to her starting on the Cymbalta. Patient has irritable bowel syndrome and it appears this medication perhaps is made it worse. She's been a discontinue the Cymbalta. Then start citalopram 10 mg in the morning for 7 days and then she'll increase to 20 mg in the morning. Skin benefits of been discussing patient's able to consent.  Insomnia start some Lunesta 1 mg at bedtime as needed. Patient states that she has used it about 3 times and it did help her sleep about 4 hours each time but she had some daytime sedation afterwards.  Patient will return in follow-up in 1 month. I discussed with patient my upcoming departure from the clinic in February. I discussed that there will be another doctor here to  continue to follow her for medications. She will see me again in 1 month. She's been encouraged call with any questions or concerns prior to her next appointment.     Wallace Going 09/07/2015, 11:50 AM

## 2015-09-11 ENCOUNTER — Encounter: Payer: Self-pay | Admitting: Internal Medicine

## 2015-09-11 ENCOUNTER — Ambulatory Visit (INDEPENDENT_AMBULATORY_CARE_PROVIDER_SITE_OTHER): Payer: 59 | Admitting: Internal Medicine

## 2015-09-11 VITALS — BP 129/82 | HR 89 | Temp 98.4°F | Ht 65.0 in | Wt 140.0 lb

## 2015-09-11 DIAGNOSIS — K589 Irritable bowel syndrome without diarrhea: Secondary | ICD-10-CM

## 2015-09-11 DIAGNOSIS — F418 Other specified anxiety disorders: Secondary | ICD-10-CM | POA: Diagnosis not present

## 2015-09-11 DIAGNOSIS — IMO0001 Reserved for inherently not codable concepts without codable children: Secondary | ICD-10-CM | POA: Insufficient documentation

## 2015-09-11 DIAGNOSIS — F909 Attention-deficit hyperactivity disorder, unspecified type: Secondary | ICD-10-CM | POA: Diagnosis not present

## 2015-09-11 DIAGNOSIS — F988 Other specified behavioral and emotional disorders with onset usually occurring in childhood and adolescence: Secondary | ICD-10-CM

## 2015-09-11 DIAGNOSIS — R030 Elevated blood-pressure reading, without diagnosis of hypertension: Secondary | ICD-10-CM

## 2015-09-11 NOTE — Progress Notes (Signed)
Subjective:    Patient ID: Dana Benson, female    DOB: 11-29-1960, 54 y.o.   MRN: [id number]  HPI  54YO female presents for follow up.  Last visit in 08/2015. Added Amitiza to help with constipation. She stopped medication because of cost.  BP has been elevated 140s systolic, at recent psychiatrist visit. She continues on Adderall. No CP, HA, palpitations.  Feels anxious all of the time. Increased stressors at work. Stopped Cymbalta. Planning to start Celexa. Using Clonazepam at bedtime. Concerned because psychiatrist is leaving town.   Wt Readings from Last 3 Encounters:  09/11/15 140 lb (63.504 kg)  09/07/15 137 lb 6.4 oz (62.324 kg)  08/10/15 138 lb 3.2 oz (62.687 kg)   BP Readings from Last 3 Encounters:  09/11/15 129/82  09/07/15 120/78  08/10/15 142/88    Past Medical History  Diagnosis Date  . History of chicken pox   . Depression   . GERD (gastroesophageal reflux disease)   . Allergic rhinitis   . ADD (attention deficit disorder with hyperactivity)   . Anxiety    Family History  Problem Relation Age of Onset  . Depression Mother   . Hypertension Mother   . Diabetes Mother   . Depression Son   . Depression Maternal Aunt   . Depression Sister   . Anxiety disorder Sister   . ADD / ADHD Sister    Past Surgical History  Procedure Laterality Date  . Vaginal hysterectomy  1995  . Tonsillectomy  1970  . Appendectomy  1979  . Bladder suspension  1995    during hysterectomy  . Cholecystectomy  1985   Social History   Social History  . Marital Status: Single    Spouse Name: N/A  . Number of Children: 3  . Years of Education: N/A   Occupational History  .     Social History Main Topics  . Smoking status: Current Every Day Smoker -- 0.50 packs/day for 20 years    Types: Cigarettes  . Smokeless tobacco: Never Used     Comment: 2 packs a week  . Alcohol Use: 0.6 - 1.2 oz/week    1-2 Glasses of wine, 0 Shots of liquor, 0 Cans of beer per week     Comment: Wine HS occasional  . Drug Use: No  . Sexual Activity: No   Other Topics Concern  . None   Social History Narrative   Lives in [place]. Works at [employer].    Review of Systems  Constitutional: Negative for fever, chills, appetite change, fatigue and unexpected weight change.  Eyes: Negative for visual disturbance.  Respiratory: Negative for shortness of breath.   Cardiovascular: Negative for chest pain, palpitations and leg swelling.  Gastrointestinal: Positive for constipation. Negative for nausea, vomiting, abdominal pain and diarrhea.  Skin: Negative for color change and rash.  Hematological: Negative for adenopathy. Does not bruise/bleed easily.  Psychiatric/Behavioral: Positive for decreased concentration. Negative for suicidal ideas, sleep disturbance and dysphoric mood. The patient is nervous/anxious.        Objective:    BP 129/82 mmHg  Pulse 89  Temp(Src) 98.4 F (36.9 C) (Oral)  Ht  (1.651 m)  Wt 140 lb (63.504 kg)  BMI 23.30 kg/m2  SpO2 97% Physical Exam  Constitutional: She is oriented to person, place, and time. She appears well-developed and well-nourished. No distress.  HENT:  Head: Normocephalic and atraumatic.  Right Ear: External ear normal.  Left Ear: External ear normal.  Nose: Nose  normal.  Mouth/Throat: Oropharynx is clear and moist. No oropharyngeal exudate.  Eyes: Conjunctivae are normal. Pupils are equal, round, and reactive to light. Right eye exhibits no discharge. Left eye exhibits no discharge. No scleral icterus.  Neck: Normal range of motion. Neck supple. No tracheal deviation present. No thyromegaly present.  Cardiovascular: Normal rate, regular rhythm, normal heart sounds and intact distal pulses.  Exam reveals no gallop and no friction rub.   No murmur heard. Pulmonary/Chest: Effort normal and breath sounds normal. No respiratory distress. She has no wheezes. She has no rales. She exhibits no tenderness.  Musculoskeletal:  Normal range of motion. She exhibits no edema or tenderness.  Lymphadenopathy:    She has no cervical adenopathy.  Neurological: She is alert and oriented to person, place, and time. No cranial nerve deficit. She exhibits normal muscle tone. Coordination normal.  Skin: Skin is warm and dry. No rash noted. She is not diaphoretic. No erythema. No pallor.  Psychiatric: She has a normal mood and affect. Her behavior is normal. Judgment and thought content normal.          Assessment & Plan:   Problem List Items Addressed This Visit      Unprioritized   ADD (attention deficit disorder)    Followed by psychiatry. Encouraged her to discuss elevated BP with her psychiatrist, as first step would be stopping Adderall.      Relevant Orders   Ambulatory referral to Psychiatry   Depression with anxiety    Continues to have symptoms of anxiety. Followed by psychiatry. Starting on Celexa. Will follow.      Relevant Orders   Ambulatory referral to Psychiatry   Elevated BP    BP Readings from Last 3 Encounters:  09/11/15 129/82  09/07/15 120/78  08/10/15 142/88   BP occasionally elevated. Recommended stopping Adderall. She will discuss with her psychiatrist.      IBS (irritable bowel syndrome) - Primary    Symptoms improved off Cymbalta. Will continue to monitor.          Return in about 3 months (around 12/10/2015) for Recheck.

## 2015-09-11 NOTE — Progress Notes (Signed)
Pre visit review using our clinic review tool, if applicable. No additional management support is needed unless otherwise documented below in the visit note. 

## 2015-09-11 NOTE — Assessment & Plan Note (Signed)
Continues to have symptoms of anxiety. Followed by psychiatry. Starting on Celexa. Will follow.

## 2015-09-11 NOTE — Assessment & Plan Note (Signed)
Symptoms improved off Cymbalta. Will continue to monitor.

## 2015-09-11 NOTE — Assessment & Plan Note (Signed)
BP Readings from Last 3 Encounters:  09/11/15 129/82  09/07/15 120/78  08/10/15 142/88   BP occasionally elevated. Recommended stopping Adderall. She will discuss with her psychiatrist.

## 2015-09-11 NOTE — Patient Instructions (Signed)
Continue current medications.  Follow up with psychiatry as scheduled.

## 2015-09-11 NOTE — Assessment & Plan Note (Signed)
Followed by psychiatry. Encouraged her to discuss elevated BP with her psychiatrist, as first step would be stopping Adderall.

## 2015-09-22 ENCOUNTER — Ambulatory Visit: Payer: Self-pay | Admitting: Physician Assistant

## 2015-09-22 DIAGNOSIS — D509 Iron deficiency anemia, unspecified: Secondary | ICD-10-CM

## 2015-09-22 MED ORDER — CYANOCOBALAMIN 1000 MCG/ML IJ SOLN
1000.0000 ug | Freq: Once | INTRAMUSCULAR | Status: AC
  Administered 2015-09-22: 1000 ug via INTRAMUSCULAR

## 2015-09-22 NOTE — Progress Notes (Signed)
Here for b12 injection as ordered by her doctor

## 2015-09-27 NOTE — Progress Notes (Signed)
Pharmacy notified.

## 2015-10-05 ENCOUNTER — Ambulatory Visit: Payer: 59 | Admitting: Psychiatry

## 2015-10-09 ENCOUNTER — Ambulatory Visit: Payer: 59 | Admitting: Psychiatry

## 2015-10-13 ENCOUNTER — Ambulatory Visit (INDEPENDENT_AMBULATORY_CARE_PROVIDER_SITE_OTHER): Payer: 59 | Admitting: Psychiatry

## 2015-10-13 ENCOUNTER — Encounter: Payer: Self-pay | Admitting: Psychiatry

## 2015-10-13 VITALS — BP 122/78 | HR 70 | Temp 98.7°F | Ht 65.0 in | Wt 140.2 lb

## 2015-10-13 DIAGNOSIS — F900 Attention-deficit hyperactivity disorder, predominantly inattentive type: Secondary | ICD-10-CM | POA: Diagnosis not present

## 2015-10-13 DIAGNOSIS — F331 Major depressive disorder, recurrent, moderate: Secondary | ICD-10-CM

## 2015-10-13 MED ORDER — AMPHETAMINE-DEXTROAMPHETAMINE 15 MG PO TABS: 15.0000 mg | ORAL_TABLET | Freq: Two times a day (BID) | ORAL | Status: DC

## 2015-10-13 MED ORDER — CITALOPRAM HYDROBROMIDE 20 MG PO TABS: 30.0000 mg | ORAL_TABLET | Freq: Every day | ORAL | Status: DC

## 2015-10-13 NOTE — Progress Notes (Signed)
BH MD/PA/NP OP Progress Note  10/13/2015 2:13 PM DONN WILMOT  MRN:  [id number]  Subjective:  Patient returns a follow-up of her ADHD and major depressive disorder, recurrent. At the last visit she had reported some benefit from Cymbalta but it significantly cause problems (i.e. constipation) for already present GI symptoms. Thus we decided to discontinue that and start citalopram. Patient states she's done well on the citalopram and does feel like it is helped her depression.  He denies any current side effects from her medications. She states insomnia continues to be an issue for her but as noted above she states she really has not been using her Lunesta.   Chief Complaint: better Chief Complaint    Follow-up; Medication Refill; Stress; Insomnia     Visit Diagnosis:   No diagnosis found.  Past Medical History:  Past Medical History  Diagnosis Date  . History of chicken pox   . Depression   . GERD (gastroesophageal reflux disease)   . Allergic rhinitis   . ADD (attention deficit disorder with hyperactivity)   . Anxiety     Past Surgical History  Procedure Laterality Date  . Vaginal hysterectomy  1995  . Tonsillectomy  1970  . Appendectomy  1979  . Bladder suspension  1995    during hysterectomy  . Cholecystectomy  1985   Family History:  Family History  Problem Relation Age of Onset  . Depression Mother   . Hypertension Mother   . Diabetes Mother   . Depression Son   . Depression Maternal Aunt   . Depression Sister   . Anxiety disorder Sister   . ADD / ADHD Sister    Social History:  Social History   Social History  . Marital Status: Single    Spouse Name: N/A  . Number of Children: 3  . Years of Education: N/A   Occupational History  .     Social History Main Topics  . Smoking status: Current Every Day Smoker -- 0.50 packs/day for 20 years    Types: Cigarettes  . Smokeless tobacco: Never Used     Comment: 2 packs a week  . Alcohol Use: 0.6 - 1.2  oz/week    1-2 Glasses of wine, 0 Shots of liquor, 0 Cans of beer per week     Comment: Wine HS occasional  . Drug Use: No  . Sexual Activity: No   Other Topics Concern  . None   Social History Narrative   Lives in [place]. Works at [employer].   Additional History:   Assessment:   Musculoskeletal: Strength & Muscle Tone: within normal limits Gait & Station: normal Patient leans: N/A  Psychiatric Specialty Exam: Insomnia PMH includes: no depression.  Depression        Associated symptoms include insomnia.  Associated symptoms include no suicidal ideas.  Past medical history includes anxiety.   Anxiety Symptoms include insomnia. Patient reports no nervous/anxious behavior or suicidal ideas.      Review of Systems  Psychiatric/Behavioral: Negative for depression, suicidal ideas, hallucinations, memory loss and substance abuse. The patient has insomnia. The patient is not nervous/anxious.   All other systems reviewed and are negative.   Blood pressure 122/78, pulse 70, temperature 98.7 F (37.1 C), temperature source Tympanic, height 5\' 5"  (1.651 m), weight 140 lb 3.2 oz (63.594 kg), SpO2 97 %.Body mass index is 23.33 kg/(m^2).  General Appearance: Well Groomed  Eye Contact:  Good  Speech:  Normal Rate  Volume:  Normal  Mood:  Good  Affect:  Congruent  Thought Process:  Linear and Logical  Orientation:  Full (Time, Place, and Person)  Thought Content:  Negative  Suicidal Thoughts:  No  Homicidal Thoughts:  No  Memory:  Immediate;   Good Recent;   Good Remote;   Good  Judgement:  Good  Insight:  Good  Psychomotor Activity:  Negative  Concentration:  Good  Recall:  Good  Fund of Knowledge: Good  Language: Good  Akathisia:  Negative  Handed:  Right unknown   AIMS (if indicated): N/A  Assets:  Communication Skills Desire for Improvement Vocational/Educational  ADL's:  Intact  Cognition: WNL  Sleep:  Good with medicine   Is the patient at risk to self?   No. Has the patient been a risk to self in the past 6 months?  No. Has the patient been a risk to self within the distant past?  No. Is the patient a risk to others?  No. Has the patient been a risk to others in the past 6 months?  No. Has the patient been a risk to others within the distant past?  No.  Current Medications: Current Outpatient Prescriptions  Medication Sig Dispense Refill  . amphetamine-dextroamphetamine (ADDERALL) 15 MG tablet Take 1 tablet by mouth 2 (two) times daily. 60 tablet 0  . cetirizine (ZYRTEC) 10 MG tablet Take 10 mg by mouth daily.    . citalopram (CELEXA) 20 MG tablet Take 1.5 tablets (30 mg total) by mouth daily. Take one half a tablet in the morning for seven days and then increase to one whole tablet in the morning. 1 tablet 0  . clonazePAM (KLONOPIN) 0.5 MG tablet Take 1 tablet (0.5 mg total) by mouth 3 (three) times daily as needed for anxiety. 90 tablet 4  . cyanocobalamin (,VITAMIN B-12,) 1000 MCG/ML injection   6  . eszopiclone (LUNESTA) 1 MG TABS tablet Take 1 tablet (1 mg total) by mouth at bedtime as needed for sleep. Take immediately before bedtime 30 tablet 1  . fluticasone (FLONASE) 50 MCG/ACT nasal spray   3   No current facility-administered medications for this visit.    Medical Decision Making:  Established Problem, Stable/Improving (1), Review of Medication Regimen & Side Effects (2) and Review of New Medication or Change in Dosage (2)  Treatment Plan Summary:Medication management and Plan   ADHD- Continue Adderall 15 mg twice a day. I've given a prescription for this month as well as a prescription to fill in February. She is aware she'll need to contact the clinic again to get additional prescriptions.  At the next visit we may discuss switching her over to an extended release formulation.   Major Depressive Disorder, recurrent, moderate-feels the citalopram has been helpful and she would like to try an increase because she feels like  her depression is decreased but it occasionally calms and last a few days. However she states that certainly improved. We'll increase it her citalopram from 20 mg daily to 30 mg daily.  Insomnia start some Lunesta 1 mg at bedtime as needed. Asian indicates she really has not taken it because she was concerned about starting the new medication citalopram at the last visit however she reflects it was helpful and she will use it as needed.  Patient will return in follow-up in 1 month. I discussed with patient my upcoming departure from the clinic in February, will see me once again prior to my departure. I've encouraged to call the clinic with  any questions or concerns prior to her next appointment.    Wallace Goinglton Araceli Coufal 10/13/2015, 2:13 PM

## 2015-11-10 ENCOUNTER — Encounter: Payer: Self-pay | Admitting: Psychiatry

## 2015-11-10 ENCOUNTER — Ambulatory Visit (INDEPENDENT_AMBULATORY_CARE_PROVIDER_SITE_OTHER): Payer: 59 | Admitting: Psychiatry

## 2015-11-10 VITALS — BP 118/78 | HR 91 | Temp 97.4°F | Ht 65.0 in | Wt 142.2 lb

## 2015-11-10 DIAGNOSIS — F900 Attention-deficit hyperactivity disorder, predominantly inattentive type: Secondary | ICD-10-CM

## 2015-11-10 DIAGNOSIS — F331 Major depressive disorder, recurrent, moderate: Secondary | ICD-10-CM

## 2015-11-10 MED ORDER — CITALOPRAM HYDROBROMIDE 40 MG PO TABS: 40.0000 mg | ORAL_TABLET | Freq: Every day | ORAL | Status: DC

## 2015-11-10 MED ORDER — AMPHETAMINE-DEXTROAMPHETAMINE 15 MG PO TABS: 15.0000 mg | ORAL_TABLET | Freq: Two times a day (BID) | ORAL | Status: DC

## 2015-11-10 MED ORDER — ESZOPICLONE 1 MG PO TABS: 1.0000 mg | ORAL_TABLET | Freq: Every evening | ORAL | Status: DC | PRN

## 2015-11-10 NOTE — Progress Notes (Signed)
BH MD/PA/NP OP Progress Note  11/10/2015 12:24 PM Dana Benson  MRN:  [id number]  Subjective:  Patient returns a follow-up of her ADHD and major depressive disorder, recurrent. She states overall not much is changed since her last visit. At the last visit we did increase her Celexa from 20 mg to 30 mg a day. Patient expresses an interest in going to the maximum dose of 40 mg. She states that she sleeps fairly well but does use the Lunesta. She estimates she might use it 3 days straight and not use it for a week. She states she feels like she is constantly tired. 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 She states that she has tried to lose some weight from dieting but feels like since she has cut out certain high calorie/carbohydrates she's now gaining more weight.   Chief Complaint: better Chief Complaint    Follow-up; Medication Refill     Visit Diagnosis:   No diagnosis found.  Past Medical History:  Past Medical History  Diagnosis Date  . History of chicken pox   . Depression   . GERD (gastroesophageal reflux disease)   . Allergic rhinitis   . ADD (attention deficit disorder with hyperactivity)   . Anxiety     Past Surgical History  Procedure Laterality Date  . Vaginal hysterectomy  1995  . Tonsillectomy  1970  . Appendectomy  1979  . Bladder suspension  1995    during hysterectomy  . Cholecystectomy  1985   Family History:  Family History  Problem Relation Age of Onset  . Depression Mother   . Hypertension Mother   . Diabetes Mother   . Depression Son   . Depression Maternal Aunt   . Depression Sister   . Anxiety disorder Sister   . ADD / ADHD Sister    Social History:  Social History   Social History  . Marital Status: Single    Spouse Name: N/A  . Number of Children: 3  . Years of Education: N/A   Occupational History  .     Social History Main Topics  . Smoking status: Current Every Day  Smoker -- 0.50 packs/day for 20 years    Types: Cigarettes  . Smokeless tobacco: Never Used     Comment: 2 packs a week  . Alcohol Use: 0.6 - 1.2 oz/week    1-2 Glasses of wine, 0 Shots of liquor, 0 Cans of beer per week     Comment: Wine HS occasional  . Drug Use: No  . Sexual Activity: No   Other Topics Concern  . None   Social History Narrative   Lives in [place]. Works at [employer].   Additional History:   Assessment:   Musculoskeletal: Strength & Muscle Tone: within normal limits Gait & Station: normal Patient leans: N/A  Psychiatric Specialty Exam: Insomnia PMH includes: depression (sit still there but no better or no worse than last visit).  Depression        Associated symptoms include does not have insomnia and no suicidal ideas.  Past medical history includes anxiety.   Anxiety Patient reports no insomnia, nervous/anxious behavior or suicidal ideas.      Review of Systems  Psychiatric/Behavioral: Positive for depression (sit still there but no better or no worse than last visit). Negative for suicidal ideas, hallucinations, memory loss and substance abuse. The patient is not nervous/anxious and does not have insomnia.  All other systems reviewed and are negative.   Blood pressure 118/78, pulse 91, temperature 97.4 F (36.3 C), temperature source Tympanic, height  (1.651 m), weight 142 lb 3.2 oz (64.501 kg), SpO2 98 %.Body mass index is 23.66 kg/(m^2).  General Appearance: Well Groomed  Eye Contact:  Good  Speech:  Normal Rate  Volume:  Normal  Mood:  Good  Affect:  initially constricted but then brightened at the end of the appointment  Thought Process:  Linear and Logical  Orientation:  Full (Time, Place, and Person)  Thought Content:  Negative  Suicidal Thoughts:  No  Homicidal Thoughts:  No  Memory:  Immediate;   Good Recent;   Good Remote;   Good  Judgement:  Good  Insight:  Good  Psychomotor Activity:  Negative  Concentration:  Good   Recall:  Good  Fund of Knowledge: Good  Language: Good  Akathisia:  Negative  Handed:  Right unknown   AIMS (if indicated): N/A  Assets:  Communication Skills Desire for Improvement Vocational/Educational  ADL's:  Intact  Cognition: WNL  Sleep:  Good with medicine   Is the patient at risk to self?  No. Has the patient been a risk to self in the past 6 months?  No. Has the patient been a risk to self within the distant past?  No. Is the patient a risk to others?  No. Has the patient been a risk to others in the past 6 months?  No. Has the patient been a risk to others within the distant past?  No.  Current Medications: Current Outpatient Prescriptions  Medication Sig Dispense Refill  . amphetamine-dextroamphetamine (ADDERALL) 15 MG tablet Take 1 tablet by mouth 2 (two) times daily. 60 tablet 0  . cetirizine (ZYRTEC) 10 MG tablet Take 10 mg by mouth daily.    . citalopram (CELEXA) 40 MG tablet Take 1 tablet (40 mg total) by mouth daily. Take one half a tablet in the morning for seven days and then increase to one whole tablet in the morning. 30 tablet 1  . clonazePAM (KLONOPIN) 0.5 MG tablet Take 1 tablet (0.5 mg total) by mouth 3 (three) times daily as needed for anxiety. 90 tablet 4  . cyanocobalamin (,VITAMIN B-12,) 1000 MCG/ML injection   6  . eszopiclone (LUNESTA) 1 MG TABS tablet Take 1 tablet (1 mg total) by mouth at bedtime as needed for sleep. Take immediately before bedtime 30 tablet 3  . fluticasone (FLONASE) 50 MCG/ACT nasal spray   3   No current facility-administered medications for this visit.    Medical Decision Making:  Established Problem, Stable/Improving (1), Review of Medication Regimen & Side Effects (2) and Review of New Medication or Change in Dosage (2)  Treatment Plan Summary:Medication management and Plan   ADHD- Continue Adderall 15 mg twice a day. I've given a prescription for this month as well as a prescription to fill in March.    Major  Depressive Disorder, recurrent, moderate-we'll increase her alprazolam from 30 mg daily to 40 mg daily.  Insomnia-continue Lunesta 1 mg at bedtime as needed.   Anxiety-continue clonazepam 0.5 mg 3 times a day as needed for anxiety  Patient will return in follow-up in 2 months. She is aware that she will see a new psychiatrist in this clinic at her next appointment. She's been encouraged call any questions or concerns prior to the next appointment.   Wallace Going 11/10/2015, 12:24 PM

## 2015-12-11 ENCOUNTER — Ambulatory Visit: Payer: Self-pay | Admitting: Internal Medicine

## 2015-12-11 ENCOUNTER — Encounter: Payer: Self-pay | Admitting: Internal Medicine

## 2015-12-11 ENCOUNTER — Ambulatory Visit (INDEPENDENT_AMBULATORY_CARE_PROVIDER_SITE_OTHER): Payer: 59 | Admitting: Internal Medicine

## 2015-12-11 VITALS — BP 143/70 | HR 106 | Temp 98.2°F | Resp 16 | Ht 65.0 in | Wt 140.4 lb

## 2015-12-11 DIAGNOSIS — R030 Elevated blood-pressure reading, without diagnosis of hypertension: Secondary | ICD-10-CM | POA: Diagnosis not present

## 2015-12-11 DIAGNOSIS — F418 Other specified anxiety disorders: Secondary | ICD-10-CM | POA: Diagnosis not present

## 2015-12-11 DIAGNOSIS — R112 Nausea with vomiting, unspecified: Secondary | ICD-10-CM

## 2015-12-11 DIAGNOSIS — K589 Irritable bowel syndrome without diarrhea: Secondary | ICD-10-CM

## 2015-12-11 DIAGNOSIS — IMO0001 Reserved for inherently not codable concepts without codable children: Secondary | ICD-10-CM

## 2015-12-11 MED ORDER — CYANOCOBALAMIN 1000 MCG/ML IJ SOLN: 1000.0000 ug | INTRAMUSCULAR | Status: DC

## 2015-12-11 MED ORDER — FLUTICASONE PROPIONATE 50 MCG/ACT NA SUSP: 2.0000 | Freq: Every day | NASAL | Status: DC

## 2015-12-11 MED ORDER — LINACLOTIDE 290 MCG PO CAPS: 290.0000 ug | ORAL_CAPSULE | Freq: Every day | ORAL | Status: DC

## 2015-12-11 MED ORDER — ONDANSETRON HCL 8 MG PO TABS: 8.0000 mg | ORAL_TABLET | Freq: Three times a day (TID) | ORAL | Status: DC | PRN

## 2015-12-11 NOTE — Assessment & Plan Note (Signed)
Recent nausea and vomiting likely viral infection, however some persistent nausea. Discussed further evaluation. Will start prn Ondansetron. Follow up if symptoms are not improving.

## 2015-12-11 NOTE — Patient Instructions (Addendum)
Continue current medications.   Follow up 3 months.  Email if symptoms of bloating and nausea not improving.

## 2015-12-11 NOTE — Assessment & Plan Note (Signed)
Symptoms well controlled on current medication. Continue to follow up with psychiatry.

## 2015-12-11 NOTE — Assessment & Plan Note (Signed)
Symptoms of IBS recently worsening with more bloating, crampy pain. Discussed potential causes. Recommended evaluation with CT. She would prefer to wait until next visit in 3 months. She will call sooner if symptoms not improving. Continue Linzess.

## 2015-12-11 NOTE — Progress Notes (Signed)
Subjective:    Patient ID: Dana Benson, female    DOB: 06/26/1961, 55 y.o.   MRN: [id number]  HPI  55YO female presents for follow up.  Last seen 08/2015 for IBS.  IBS - Having some intermittent nausea particularly when anxious. Some chronic diffuse abdominal pain and distension. Pain varies in location. Has some bloating and having trouble fitting into clothes at times.  3 weeks ago had episode of severe nausea. Had NV and diarrhea. Symptoms have resolved. Several coworkers were ill with similar symptoms during this period.  Anxiety/Depression - Symptoms well controlled on Citalopram and prn Clonazepam.   Wt Readings from Last 3 Encounters:  12/11/15 140 lb 6.4 oz (63.685 kg)  11/10/15 142 lb 3.2 oz (64.501 kg)  10/13/15 140 lb 3.2 oz (63.594 kg)   BP Readings from Last 3 Encounters:  12/11/15 143/70  11/10/15 118/78  10/13/15 122/78    Past Medical History  Diagnosis Date  . History of chicken pox   . Depression   . GERD (gastroesophageal reflux disease)   . Allergic rhinitis   . ADD (attention deficit disorder with hyperactivity)   . Anxiety    Family History  Problem Relation Age of Onset  . Depression Mother   . Hypertension Mother   . Diabetes Mother   . Depression Son   . Depression Maternal Aunt   . Depression Sister   . Anxiety disorder Sister   . ADD / ADHD Sister    Past Surgical History  Procedure Laterality Date  . Vaginal hysterectomy  1995  . Tonsillectomy  1970  . Appendectomy  1979  . Bladder suspension  1995    during hysterectomy  . Cholecystectomy  1985   Social History   Social History  . Marital Status: Single    Spouse Name: N/A  . Number of Children: 3  . Years of Education: N/A   Occupational History  .     Social History Main Topics  . Smoking status: Current Every Day Smoker -- 0.50 packs/day for 20 years    Types: Cigarettes  . Smokeless tobacco: Never Used     Comment: 2 packs a week  . Alcohol Use: 0.6 - 1.2  oz/week    1-2 Glasses of wine, 0 Shots of liquor, 0 Cans of beer per week     Comment: Wine HS occasional  . Drug Use: No  . Sexual Activity: No   Other Topics Concern  . None   Social History Narrative   [place]ives in [place]. Works at [employer].    Review of Systems  Constitutional: Negative for fever, chills, appetite change, fatigue and unexpected weight change.  Eyes: Negative for visual disturbance.  Respiratory: Negative for shortness of breath.   Cardiovascular: Negative for chest pain and leg swelling.  Gastrointestinal: Positive for nausea, vomiting, abdominal pain, diarrhea, constipation and abdominal distention. Negative for blood in stool and rectal pain.  Skin: Negative for color change and rash.  Hematological: Negative for adenopathy. Does not bruise/bleed easily.  Psychiatric/Behavioral: Negative for sleep disturbance and dysphoric mood. The patient is not nervous/anxious.        Objective:    BP 143/70 mmHg  Pulse 106  Temp(Src) 98.2 F (36.8 C) (Oral)  Resp 16  Ht 5\' 5"  (1.651 m)  Wt 140 lb 6.4 oz (63.685 kg)  BMI 23.36 kg/m2  SpO2 97% Physical Exam  Constitutional: She is oriented to person, place, and time. She appears well-developed and well-nourished. No distress.  HENT:  Head: Normocephalic and atraumatic.  Right Ear: External ear normal.  Left Ear: External ear normal.  Nose: Nose normal.  Mouth/Throat: Oropharynx is clear and moist. No oropharyngeal exudate.  Eyes: Conjunctivae and EOM are normal. Pupils are equal, round, and reactive to light. Right eye exhibits no discharge.  Neck: Normal range of motion. Neck supple. No thyromegaly present.  Cardiovascular: Normal rate, regular rhythm, normal heart sounds and intact distal pulses.  Exam reveals no gallop and no friction rub.   No murmur heard. Pulmonary/Chest: Effort normal. No respiratory distress. She has no wheezes. She has no rales.  Abdominal: Soft. Bowel sounds are normal. She exhibits  distension (diffuse). She exhibits no mass. There is no tenderness. There is no rebound and no guarding.  Musculoskeletal: Normal range of motion. She exhibits no edema or tenderness.  Lymphadenopathy:    She has no cervical adenopathy.  Neurological: She is alert and oriented to person, place, and time. No cranial nerve deficit. Coordination normal.  Skin: Skin is warm and dry. No rash noted. She is not diaphoretic. No erythema. No pallor.  Psychiatric: She has a normal mood and affect. Her behavior is normal. Judgment and thought content normal.          Assessment & Plan:   Problem List Items Addressed This Visit      Unprioritized   Depression with anxiety    Symptoms well controlled on current medication. Continue to follow up with psychiatry.      Elevated BP    BP Readings from Last 3 Encounters:  12/11/15 143/70  11/10/15 118/78  10/13/15 122/78   BP generally well controlled. Will continue to monitor.      IBS (irritable bowel syndrome) - Primary    Symptoms of IBS recently worsening with more bloating, crampy pain. Discussed potential causes. Recommended evaluation with CT. She would prefer to wait until next visit in 3 months. She will call sooner if symptoms not improving. Continue Linzess.      Relevant Medications   ondansetron (ZOFRAN) 8 MG tablet   Linaclotide (LINZESS) 290 MCG CAPS capsule   Nausea with vomiting    Recent nausea and vomiting likely viral infection, however some persistent nausea. Discussed further evaluation. Will start prn Ondansetron. Follow up if symptoms are not improving.          Return in about 3 months (around 03/12/2016) for Recheck.  Ronna Polio, MD Internal Medicine [facility]

## 2015-12-11 NOTE — Assessment & Plan Note (Signed)
BP Readings from Last 3 Encounters:  12/11/15 143/70  11/10/15 118/78  10/13/15 122/78   BP generally well controlled. Will continue to monitor.

## 2015-12-29 ENCOUNTER — Ambulatory Visit: Payer: Self-pay | Admitting: Physician Assistant

## 2016-01-01 ENCOUNTER — Ambulatory Visit: Payer: Self-pay | Admitting: Physician Assistant

## 2016-01-03 ENCOUNTER — Ambulatory Visit: Payer: Self-pay | Admitting: Physician Assistant

## 2016-01-03 ENCOUNTER — Encounter: Payer: Self-pay | Admitting: Physician Assistant

## 2016-01-03 VITALS — BP 110/76 | Temp 97.8°F

## 2016-01-03 DIAGNOSIS — D510 Vitamin B12 deficiency anemia due to intrinsic factor deficiency: Secondary | ICD-10-CM

## 2016-01-03 MED ORDER — CYANOCOBALAMIN 1000 MCG/ML IJ SOLN
1000.0000 ug | Freq: Once | INTRAMUSCULAR | Status: AC
  Administered 2016-01-03: 1000 ug via INTRAMUSCULAR

## 2016-01-03 MED ORDER — CYANOCOBALAMIN 1000 MCG/ML IJ SOLN: 1000.0000 ug | Freq: Once | INTRAMUSCULAR | Status: DC

## 2016-01-03 NOTE — Progress Notes (Signed)
Patient ID: Dana MayhewLinda M Benson, female   DOB: 07/16/1961, 55 y.o.   MRN: [id number] Patient in office today for B12 injection only given IM right gluteal

## 2016-01-24 ENCOUNTER — Ambulatory Visit: Payer: Self-pay | Admitting: Physician Assistant

## 2016-01-29 ENCOUNTER — Encounter: Payer: Self-pay | Admitting: Psychiatry

## 2016-01-29 ENCOUNTER — Ambulatory Visit (INDEPENDENT_AMBULATORY_CARE_PROVIDER_SITE_OTHER): Payer: 59 | Admitting: Psychiatry

## 2016-01-29 VITALS — BP 118/68 | HR 85 | Temp 97.6°F | Ht 65.0 in | Wt 143.0 lb

## 2016-01-29 DIAGNOSIS — F900 Attention-deficit hyperactivity disorder, predominantly inattentive type: Secondary | ICD-10-CM

## 2016-01-29 DIAGNOSIS — F331 Major depressive disorder, recurrent, moderate: Secondary | ICD-10-CM

## 2016-01-29 MED ORDER — AMPHETAMINE-DEXTROAMPHETAMINE 15 MG PO TABS: 15.0000 mg | ORAL_TABLET | Freq: Every day | ORAL | Status: DC

## 2016-01-29 MED ORDER — CITALOPRAM HYDROBROMIDE 40 MG PO TABS: 40.0000 mg | ORAL_TABLET | Freq: Every day | ORAL | Status: DC

## 2016-01-29 MED ORDER — CLONAZEPAM 0.5 MG PO TABS: 0.5000 mg | ORAL_TABLET | Freq: Every day | ORAL | Status: DC

## 2016-01-29 NOTE — Progress Notes (Signed)
BH MD/PA/NP OP Progress Note  01/29/2016 3:04 PM Dana MayhewLinda M Benson  MRN:  [id number]  Subjective:  Patient is a 55 year old female who presented for follow-up appointment. She was previously following Dr. Mayford KnifeWilliams. She reported that she continues to feel tired throughout the day. She has been taking her medications. She is also on Adderall 15 mg twice daily for her ADD symptoms. She takes Celexa 40 mg in the morning and also takes Klonopin at bedtime. She reported that she feels very tired and has to drag herself toward a day. She drinks 2 cups of coffee and has been drinking soda to keep herself awake. She reported that she has tried Vyvanse in the past but it made her worse. 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 She is not interested in switching to Adderall XR at this time. She denied having any adverse effects of the medications. She has recently filled her medications.  Chief Complaint:  Chief Complaint    Follow-up; Medication Refill     Visit Diagnosis:     ICD-9-CM ICD-10-CM   1. Major depressive disorder, recurrent episode, moderate (HCC) 296.32 F33.1   2. Attention deficit hyperactivity disorder (ADHD), predominantly inattentive type 314.01 F90.0     Past Medical History:  Past Medical History  Diagnosis Date  . History of chicken pox   . Depression   . GERD (gastroesophageal reflux disease)   . Allergic rhinitis   . ADD (attention deficit disorder with hyperactivity)   . Anxiety     Past Surgical History  Procedure Laterality Date  . Vaginal hysterectomy  1995  . Tonsillectomy  1970  . Appendectomy  1979  . Bladder suspension  1995    during hysterectomy  . Cholecystectomy  1985   Family History:  Family History  Problem Relation Age of Onset  . Depression Mother   . Hypertension Mother   . Diabetes Mother   . Depression Son   . Depression Maternal Aunt   .  Depression Sister   . Anxiety disorder Sister   . ADD / ADHD Sister    Social History:  Social History   Social History  . Marital Status: Single    Spouse Name: N/A  . Number of Children: 3  . Years of Education: N/A   Occupational History  .     Social History Main Topics  . Smoking status: Current Every Day Smoker -- 0.50 packs/day for 20 years    Types: Cigarettes  . Smokeless tobacco: Never Used     Comment: 2 packs a week  . Alcohol Use: 0.6 - 1.2 oz/week    1-2 Glasses of wine, 0 Shots of liquor, 0 Cans of beer per week     Comment: Wine HS occasional  . Drug Use: No  . Sexual Activity: No   Other Topics Concern  . None   Social History Narrative   [place] in [place]. Works at [employer].   Additional History:   Assessment:   Musculoskeletal: Strength & Muscle Tone: within normal limits Gait & Station: normal Patient leans: N/A  Psychiatric Specialty Exam: Insomnia Depression: sit still there but no better or no worse than last visit.   Depression        Associated symptoms include insomnia.  Associated symptoms include no suicidal ideas.  Past medical history includes anxiety.   Anxiety Symptoms include insomnia. Patient reports  no nervous/anxious behavior or suicidal ideas.      Review of Systems  Psychiatric/Behavioral: Negative for suicidal ideas, hallucinations, memory loss and substance abuse. Depression: sit still there but no better or no worse than last visit. The patient has insomnia. The patient is not nervous/anxious.   All other systems reviewed and are negative.   Blood pressure 118/68, pulse 85, temperature 97.6 F (36.4 C), temperature source Tympanic, height  (1.651 m), weight 143 lb (64.864 kg), SpO2 97 %.Body mass index is 23.8 kg/(m^2).  General Appearance: Well Groomed  Eye Contact:  Good  Speech:  Normal Rate  Volume:  Normal  Mood:  Good  Affect:  initially constricted but then brightened at the end of the appointment   Thought Process:  Linear and Logical  Orientation:  Full (Time, Place, and Person)  Thought Content:  Negative  Suicidal Thoughts:  No  Homicidal Thoughts:  No  Memory:  Immediate;   Good Recent;   Good Remote;   Good  Judgement:  Good  Insight:  Good  Psychomotor Activity:  Negative  Concentration:  Good  Recall:  Good  Fund of Knowledge: Good  Language: Good  Akathisia:  Negative  Handed:  Right unknown   AIMS (if indicated): N/A  Assets:  Communication Skills Desire for Improvement Vocational/Educational  ADL's:  Intact  Cognition: WNL  Sleep:  Good with medicine   Is the patient at risk to self?  No. Has the patient been a risk to self in the past 6 months?  No. Has the patient been a risk to self within the distant past?  No. Is the patient a risk to others?  No. Has the patient been a risk to others in the past 6 months?  No. Has the patient been a risk to others within the distant past?  No.  Current Medications: Current Outpatient Prescriptions  Medication Sig Dispense Refill  . amphetamine-dextroamphetamine (ADDERALL) 15 MG tablet Take 1 tablet by mouth 2 (two) times daily. 60 tablet 0  . cetirizine (ZYRTEC) 10 MG tablet Take 10 mg by mouth daily.    . citalopram (CELEXA) 40 MG tablet Take 1 tablet (40 mg total) by mouth daily. Take one half a tablet in the morning for seven days and then increase to one whole tablet in the morning. 30 tablet 1  . clonazePAM (KLONOPIN) 0.5 MG tablet Take 1 tablet (0.5 mg total) by mouth 3 (three) times daily as needed for anxiety. 90 tablet 4  . cyanocobalamin (,VITAMIN B-12,) 1000 MCG/ML injection Inject 1 mL (1,000 mcg total) into the muscle every 30 (thirty) days. 1 mL 11  . cyanocobalamin (,VITAMIN B-12,) 1000 MCG/ML injection Inject 1 mL (1,000 mcg total) into the muscle once. 1 mL 0  . eszopiclone (LUNESTA) 1 MG TABS tablet Take 1 tablet (1 mg total) by mouth at bedtime as needed for sleep. Take immediately before bedtime 30  tablet 3  . fluticasone (FLONASE) 50 MCG/ACT nasal spray Place 2 sprays into both nostrils daily. 16 g 11  . Linaclotide (LINZESS) 290 MCG CAPS capsule Take 1 capsule (290 mcg total) by mouth daily. 30 capsule 6  . ondansetron (ZOFRAN) 8 MG tablet Take 1 tablet (8 mg total) by mouth every 8 (eight) hours as needed for nausea or vomiting. 30 tablet 3   No current facility-administered medications for this visit.    Medical Decision Making:  Established Problem, Stable/Improving (1), Review of Medication Regimen & Side Effects (2) and Review of  New Medication or Change in Dosage (2)  Treatment Plan Summary:Medication management and Plan   ADHD- Continue Adderall 15 mg twice a day.   Major Depressive Disorder, recurrent, moderate- continue celexa 40mg  po qs    Anxiety-continue clonazepam qhs - pt has supply    More than 50% of the time spent in psychoeducation, counseling and coordination of care.    This note was generated in part or whole with voice recognition software. Voice regonition is usually quite accurate but there are transcription errors that can and very often do occur. I apologize for any typographical errors that were not detected and corrected.    Brandy Hale, MD  01/29/2016, 3:04 PM

## 2016-03-11 ENCOUNTER — Encounter: Payer: Self-pay | Admitting: Psychiatry

## 2016-03-11 ENCOUNTER — Ambulatory Visit (INDEPENDENT_AMBULATORY_CARE_PROVIDER_SITE_OTHER): Payer: 59 | Admitting: Psychiatry

## 2016-03-11 VITALS — BP 129/79 | HR 61 | Temp 98.8°F | Ht 65.6 in | Wt 142.4 lb

## 2016-03-11 DIAGNOSIS — F502 Bulimia nervosa: Secondary | ICD-10-CM

## 2016-03-11 MED ORDER — AMPHETAMINE-DEXTROAMPHETAMINE 15 MG PO TABS: 15.0000 mg | ORAL_TABLET | Freq: Every day | ORAL | Status: DC

## 2016-03-11 MED ORDER — CLONAZEPAM 0.5 MG PO TABS: 0.5000 mg | ORAL_TABLET | Freq: Every day | ORAL | Status: DC

## 2016-03-11 MED ORDER — CITALOPRAM HYDROBROMIDE 40 MG PO TABS: 40.0000 mg | ORAL_TABLET | Freq: Every day | ORAL | Status: DC

## 2016-03-11 NOTE — Progress Notes (Signed)
BH MD/PA/NP OP Progress Note  03/11/2016 3:27 PM Dana Benson  MRN:  [id number]  Subjective:  Patient is a 55 year old female who presented for follow-up appointment. She was previously following Dr. Mayford KnifeWilliams. She reported that she continues to feel tired throughout the day. She was noted to be drinking coffee during the interview as usual. She reported that she does not eat at night and is having problems with sleeping. She was focused on her weight. Patient reported that she is trying to move and is not able to function well. Patient remains circumstantial during the interview. She was getting Adderall twice daily by Dr. Mayford KnifeWilliams although she does not have any diagnosis of ADHD. She also takes Klonopin at bedtime as she tries to sleep but is unable to sleep as she has not been eating well at night. Patient reported she was in the use at lunchtime and feels tired throughout the day. She has to drag herself to work on a daily basis.. She drinks 2 cups of coffee and has been drinking soda to keep herself awake.   She currently denied having any suicidal homicidal ideations or plan Chief Complaint:   Visit Diagnosis:     ICD-9-CM ICD-10-CM   1. Bulimia nervosa 307.51 F50.2     Past Medical History:  Past Medical History  Diagnosis Date  . History of chicken pox   . Depression   . GERD (gastroesophageal reflux disease)   . Allergic rhinitis   . ADD (attention deficit disorder with hyperactivity)   . Anxiety     Past Surgical History  Procedure Laterality Date  . Vaginal hysterectomy  1995  . Tonsillectomy  1970  . Appendectomy  1979  . Bladder suspension  1995    during hysterectomy  . Cholecystectomy  1985   Family History:  Family History  Problem Relation Age of Onset  . Depression Mother   . Hypertension Mother   . Diabetes Mother   . Depression Son   . Depression Maternal Aunt   . Depression Sister   . Anxiety disorder Sister   . ADD / ADHD Sister    Social History:   Social History   Social History  . Marital Status: Single    Spouse Name: N/A  . Number of Children: 3  . Years of Education: N/A   Occupational History  .     Social History Main Topics  . Smoking status: Current Every Day Smoker -- 0.50 packs/day for 20 years    Types: Cigarettes  . Smokeless tobacco: Never Used     Comment: 2 packs a week  . Alcohol Use: 0.6 - 1.2 oz/week    1-2 Glasses of wine, 0 Shots of liquor, 0 Cans of beer per week     Comment: Wine HS occasional  . Drug Use: No  . Sexual Activity: No   Other Topics Concern  . None   Social History Narrative   [place] in [place]. Works at [employer].   Additional History:   Assessment:   Musculoskeletal: Strength & Muscle Tone: within normal limits Gait & Station: normal Patient leans: N/A  Psychiatric Specialty Exam: Insomnia Depression: sit still there but no better or no worse than last visit.   Depression        Associated symptoms include insomnia.  Associated symptoms include no suicidal ideas.  Past medical history includes anxiety.   Anxiety Symptoms include insomnia. Patient reports no nervous/anxious behavior or suicidal ideas.      Review  of Systems  Psychiatric/Behavioral: Negative for suicidal ideas, hallucinations, memory loss and substance abuse. Depression: sit still there but no better or no worse than last visit. The patient has insomnia. The patient is not nervous/anxious.   All other systems reviewed and are negative.   Blood pressure 129/79, pulse 61, temperature 98.8 F (37.1 C), height 5' 5.6" (1.666 m), weight 142 lb 6.4 oz (64.592 kg), SpO2 98 %.Body mass index is 23.27 kg/(m^2).  General Appearance: Well Groomed  Eye Contact:  Good  Speech:  Normal Rate  Volume:  Normal  Mood:  Good  Affect:  Congruent  Thought Process:  Coherent  Orientation:  Full (Time, Place, and Person)  Thought Content:  Negative  Suicidal Thoughts:  No  Homicidal Thoughts:  No  Memory:  Immediate;    Good Recent;   Good Remote;   Good  Judgement:  Good  Insight:  Good  Psychomotor Activity:  Negative  Concentration:  Good  Recall:  Good  Fund of Knowledge: Good  Language: Good  Akathisia:  Negative  Handed:  Right unknown   AIMS (if indicated): N/A  Assets:  Communication Skills Desire for Improvement Vocational/Educational  ADL's:  Intact  Cognition: WNL  Sleep:  Good with medicine   Is the patient at risk to self?  No. Has the patient been a risk to self in the past 6 months?  No. Has the patient been a risk to self within the distant past?  No. Is the patient a risk to others?  No. Has the patient been a risk to others in the past 6 months?  No. Has the patient been a risk to others within the distant past?  No.  Current Medications: Current Outpatient Prescriptions  Medication Sig Dispense Refill  . amphetamine-dextroamphetamine (ADDERALL) 15 MG tablet Take 1 tablet by mouth daily. 30 tablet 0  . cetirizine (ZYRTEC) 10 MG tablet Take 10 mg by mouth daily.    . citalopram (CELEXA) 40 MG tablet Take 1 tablet (40 mg total) by mouth daily. 30 tablet 1  . clonazePAM (KLONOPIN) 0.5 MG tablet Take 1 tablet (0.5 mg total) by mouth at bedtime. 30 tablet 0  . clonazePAM (KLONOPIN) 0.5 MG tablet Take 1 tablet (0.5 mg total) by mouth at bedtime. 30 tablet 0  . cyanocobalamin (,VITAMIN B-12,) 1000 MCG/ML injection Inject 1 mL (1,000 mcg total) into the muscle every 30 (thirty) days. 1 mL 11  . cyanocobalamin (,VITAMIN B-12,) 1000 MCG/ML injection Inject 1 mL (1,000 mcg total) into the muscle once. 1 mL 0  . fluticasone (FLONASE) 50 MCG/ACT nasal spray Place 2 sprays into both nostrils daily. 16 g 11  . Linaclotide (LINZESS) 290 MCG CAPS capsule Take 1 capsule (290 mcg total) by mouth daily. 30 capsule 6  . ondansetron (ZOFRAN) 8 MG tablet Take 1 tablet (8 mg total) by mouth every 8 (eight) hours as needed for nausea or vomiting. 30 tablet 3   No current facility-administered  medications for this visit.    Medical Decision Making:  Established Problem, Stable/Improving (1), Review of Medication Regimen & Side Effects (2) and Review of New Medication or Change in Dosage (2)  Treatment Plan Summary:Medication management and Plan  Discussed with patient at length about her diagnosis and her medications. She did not agree with the diagnosis and reported that she takes Adderall twice daily to help with her concentration. She also takes Klonopin twice daily to help with her anxiety. She is also trying to lose weight  and she does not eat to keep her weight down.  I will decrease Adderall 15 mg daily I will decrease Klonopin 0.5 mg by mouth daily at bedtime Continue Celexa 40 mg daily  Advised patient to start doing therapy but she reported that she does not have co-pay to afford the therapy on a monthly basis  Follow-up in one month or earlier   More than 50% of the time spent in psychoeducation, counseling and coordination of care.    This note was generated in part or whole with voice recognition software. Voice regonition is usually quite accurate but there are transcription errors that can and very often do occur. I apologize for any typographical errors that were not detected and corrected.    Brandy Hale, MD  03/11/2016, 3:27 PM

## 2016-03-13 ENCOUNTER — Encounter: Payer: Self-pay | Admitting: Internal Medicine

## 2016-03-13 ENCOUNTER — Ambulatory Visit (INDEPENDENT_AMBULATORY_CARE_PROVIDER_SITE_OTHER): Payer: 59 | Admitting: Internal Medicine

## 2016-03-13 VITALS — BP 140/86 | HR 83 | Ht 65.0 in | Wt 143.4 lb

## 2016-03-13 DIAGNOSIS — K589 Irritable bowel syndrome without diarrhea: Secondary | ICD-10-CM | POA: Diagnosis not present

## 2016-03-13 DIAGNOSIS — F418 Other specified anxiety disorders: Secondary | ICD-10-CM | POA: Diagnosis not present

## 2016-03-13 DIAGNOSIS — R0600 Dyspnea, unspecified: Secondary | ICD-10-CM | POA: Diagnosis not present

## 2016-03-13 DIAGNOSIS — F909 Attention-deficit hyperactivity disorder, unspecified type: Secondary | ICD-10-CM

## 2016-03-13 DIAGNOSIS — F988 Other specified behavioral and emotional disorders with onset usually occurring in childhood and adolescence: Secondary | ICD-10-CM

## 2016-03-13 NOTE — Assessment & Plan Note (Signed)
Recent dyspnea. Exam is normal today. Discussed that she likely has COPD from years of smoking. Recommended CXR and PFTs. She declines. Encouraged smoking cessation.

## 2016-03-13 NOTE — Assessment & Plan Note (Signed)
Discussed risks of long term use of Adderall. Encouraged psychiatry follow up

## 2016-03-13 NOTE — Assessment & Plan Note (Signed)
Symptoms relatively stable. Continue prn Linzess.

## 2016-03-13 NOTE — Progress Notes (Signed)
Subjective:    Patient ID: Dana Benson, female    DOB: 1960-12-27, 55 y.o.   MRN: [id number]  HPI  55YO female presents for follow up.  IBS - Last seen in 11/2015.  Symptoms of abdominal cramping come and go. Not taking Linzess on a regular basis.  Depression/Anxiety - Recently seen by psychiatry. Cut Celexa to  daily. Cut Adderall to  daily. She is not happy with this. Reports trouble communicating with her psychiatrist. Felt more anxious and depressed after leaving visit.  Over last few weeks, notes more swelling in legs. This has improved some. During this time, felt more short of breath. Her sister thinks she needs an ABG. She had PFTs in past, but does not want to repeat. Smokes about 3 packs per week.  Wt Readings from Last 3 Encounters:  03/13/16 143 lb 6.6 oz (65.05 kg)  03/11/16 142 lb 6.4 oz (64.592 kg)  01/29/16 143 lb (64.864 kg)   BP Readings from Last 3 Encounters:  03/13/16 140/86  03/11/16 129/79  01/29/16 118/68    Past Medical History  Diagnosis Date  . History of chicken pox   . Depression   . GERD (gastroesophageal reflux disease)   . Allergic rhinitis   . ADD (attention deficit disorder with hyperactivity)   . Anxiety    Family History  Problem Relation Age of Onset  . Depression Mother   . Hypertension Mother   . Diabetes Mother   . Depression Son   . Depression Maternal Aunt   . Depression Sister   . Anxiety disorder Sister   . ADD / ADHD Sister    Past Surgical History  Procedure Laterality Date  . Vaginal hysterectomy  1995  . Tonsillectomy  1970  . Appendectomy  1979  . Bladder suspension  1995    during hysterectomy  . Cholecystectomy  1985   Social History   Social History  . Marital Status: Single    Spouse Name: N/A  . Number of Children: 3  . Years of Education: N/A   Occupational History  .     Social History Main Topics  . Smoking status: Current Every Day Smoker -- 0.50 packs/day for 20 years    Types:  Cigarettes  . Smokeless tobacco: Never Used     Comment: 2 packs a week  . Alcohol Use: 0.6 - 1.2 oz/week    1-2 Glasses of wine, 0 Shots of liquor, 0 Cans of beer per week     Comment: Wine HS occasional  . Drug Use: No  . Sexual Activity: No   Other Topics Concern  . None   Social History Narrative   Lives in [place]. Works at [employer].    Review of Systems  Constitutional: Negative for fever, chills, appetite change, fatigue and unexpected weight change.  Eyes: Negative for visual disturbance.  Respiratory: Positive for shortness of breath. Negative for cough, chest tightness and wheezing.   Cardiovascular: Negative for chest pain, palpitations and leg swelling.  Gastrointestinal: Positive for abdominal pain (occasional). Negative for vomiting, diarrhea and constipation.  Skin: Negative for color change and rash.  Hematological: Negative for adenopathy. Does not bruise/bleed easily.  Psychiatric/Behavioral: Positive for sleep disturbance and dysphoric mood. Negative for suicidal ideas. The patient is nervous/anxious.        Objective:    BP 140/86 mmHg  Pulse 83  Ht  (1.651 m)  Wt 143 lb 6.6 oz (65.05 kg)  BMI 23.86 kg/m2  SpO2  98% Physical Exam  Constitutional: She is oriented to person, place, and time. She appears well-developed and well-nourished. No distress.  HENT:  Head: Normocephalic and atraumatic.  Right Ear: External ear normal.  Left Ear: External ear normal.  Nose: Nose normal.  Mouth/Throat: Oropharynx is clear and moist. No oropharyngeal exudate.  Eyes: Conjunctivae are normal. Pupils are equal, round, and reactive to light. Right eye exhibits no discharge. Left eye exhibits no discharge. No scleral icterus.  Neck: Normal range of motion. Neck supple. No tracheal deviation present. No thyromegaly present.  Cardiovascular: Normal rate, regular rhythm, normal heart sounds and intact distal pulses.  Exam reveals no gallop and no friction rub.   No  murmur heard. Pulmonary/Chest: Effort normal and breath sounds normal. No respiratory distress. She has no wheezes. She has no rales. She exhibits no tenderness.  Musculoskeletal: Normal range of motion. She exhibits no edema or tenderness.  Lymphadenopathy:    She has no cervical adenopathy.  Neurological: She is alert and oriented to person, place, and time. No cranial nerve deficit. She exhibits normal muscle tone. Coordination normal.  Skin: Skin is warm and dry. No rash noted. She is not diaphoretic. No erythema. No pallor.  Psychiatric: Her speech is normal. Judgment and thought content normal. Her mood appears anxious. Her affect is angry. She is agitated. Cognition and memory are normal.          Assessment & Plan:   Problem List Items Addressed This Visit      Unprioritized   ADD (attention deficit disorder)    Discussed risks of long term use of Adderall. Encouraged psychiatry follow up      Depression with anxiety    Encouraged her to consider evaluation with another psychiatrist if she does not feel comfortable with current one. She will consider this.      Dyspnea    Recent dyspnea. Exam is normal today. Discussed that she likely has COPD from years of smoking. Recommended CXR and PFTs. She declines. Encouraged smoking cessation.      IBS (irritable bowel syndrome) - Primary    Symptoms relatively stable. Continue prn Linzess.      Relevant Orders   Comprehensive metabolic panel       Return in about 3 months (around 06/13/2016) for Recheck.  Ronna PolioJennifer Laiba Fuerte, MD Internal Medicine [facility]

## 2016-03-13 NOTE — Assessment & Plan Note (Signed)
Encouraged her to consider evaluation with another psychiatrist if she does not feel comfortable with current one. She will consider this.

## 2016-03-13 NOTE — Progress Notes (Signed)
Pre visit review using our clinic review tool, if applicable. No additional management support is needed unless otherwise documented below in the visit note. 

## 2016-03-13 NOTE — Patient Instructions (Signed)
Labs today.   Follow up in 3 months.  

## 2016-03-14 LAB — COMPREHENSIVE METABOLIC PANEL
ALT: 34 U/L (ref 0–35)
AST: 22 U/L (ref 0–37)
Albumin: 4.2 g/dL (ref 3.5–5.2)
Alkaline Phosphatase: 111 U/L (ref 39–117)
BILIRUBIN TOTAL: 0.3 mg/dL (ref 0.2–1.2)
BUN: 11 mg/dL (ref 6–23)
CO2: 26 meq/L (ref 19–32)
Calcium: 9.1 mg/dL (ref 8.4–10.5)
Chloride: 105 mEq/L (ref 96–112)
Creatinine, Ser: 0.88 mg/dL (ref 0.40–1.20)
GFR: 70.99 mL/min (ref >60.00)
GLUCOSE: 135 mg/dL — AB (ref 70–99)
Potassium: 4 mEq/L (ref 3.5–5.1)
Sodium: 138 mEq/L (ref 135–145)
Total Protein: 6.2 g/dL (ref 6.0–8.3)

## 2016-04-10 ENCOUNTER — Ambulatory Visit (INDEPENDENT_AMBULATORY_CARE_PROVIDER_SITE_OTHER): Payer: 59 | Admitting: Psychiatry

## 2016-04-10 DIAGNOSIS — F331 Major depressive disorder, recurrent, moderate: Secondary | ICD-10-CM | POA: Diagnosis not present

## 2016-04-10 DIAGNOSIS — F900 Attention-deficit hyperactivity disorder, predominantly inattentive type: Secondary | ICD-10-CM

## 2016-04-10 MED ORDER — CLONAZEPAM 0.5 MG PO TABS: 0.5000 mg | ORAL_TABLET | Freq: Every day | ORAL | Status: DC

## 2016-04-10 MED ORDER — CITALOPRAM HYDROBROMIDE 40 MG PO TABS: 40.0000 mg | ORAL_TABLET | Freq: Every day | ORAL | Status: DC

## 2016-04-10 MED ORDER — AMPHETAMINE-DEXTROAMPHETAMINE 15 MG PO TABS: 15.0000 mg | ORAL_TABLET | Freq: Every day | ORAL | Status: DC

## 2016-04-10 NOTE — Progress Notes (Signed)
BH MD/PA/NP OP Progress Note  04/10/2016 3:12 PM Dana Benson  MRN:  [id number]  Subjective:  Patient is a 55 year old female who presented for follow-up appointment. 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 She was also focused on her diagnoses and reported that she has been diagnosed with bulimia although she does not have any symptoms. She was also discussing about her medications in detail. She reported that she wants a prescription of Klonopin to be taken on her twice daily basis. She says coming from a beach where she was spending time with her sister and her family. She enjoyed her time. Patient was noted to be hyper during the interview. She reported that she continues to try control her weight although she is not concerned about the same. She reported that she has difficulty sleeping at night. She was minimizing her use of Adderall at this time. She reported that she continues to drink caffeine throughout the day. She denied having any suicidal homicidal ideations or plans. We discussed about her diagnosis and her medications at length and she demonstrated understanding.   Chief Complaint:   Visit Diagnosis:     ICD-9-CM ICD-10-CM   1. Major depressive disorder, recurrent episode, moderate (HCC) 296.32 F33.1   2. Bulimia nervosa 307.51 F50.2   3. Attention deficit hyperactivity disorder (ADHD), predominantly inattentive type 314.01 F90.0     Past Medical History:  Past Medical History  Diagnosis Date  . History of chicken pox   . Depression   . GERD (gastroesophageal reflux disease)   . Allergic rhinitis   . ADD (attention deficit disorder with hyperactivity)   . Anxiety     Past Surgical History  Procedure Laterality Date  . Vaginal hysterectomy  1995  . Tonsillectomy  1970  . Appendectomy  1979  . Bladder suspension  1995    during hysterectomy  . Cholecystectomy  1985    Family History:  Family History  Problem Relation Age of Onset  . Depression Mother   . Hypertension Mother   . Diabetes Mother   . Depression Son   . Depression Maternal Aunt   . Depression Sister   . Anxiety disorder Sister   . ADD / ADHD Sister    Social History:  Social History   Social History  . Marital Status: Single    Spouse Name: N/A  . Number of Children: 3  . Years of Education: N/A   Occupational History  .     Social History Main Topics  . Smoking status: Current Every Day Smoker -- 0.50 packs/day for 20 years    Types: Cigarettes  . Smokeless tobacco: Never Used     Comment: 2 packs a week  . Alcohol Use: 0.6 - 1.2 oz/week    1-2 Glasses of wine, 0 Shots of liquor, 0 Cans of beer per week     Comment: Wine HS occasional  . Drug Use: No  . Sexual Activity: No   Other Topics Concern  . Not on file   Social History Narrative   Lives in [place]. Works at [employer].   Additional History:   Assessment:   Musculoskeletal: Strength & Muscle Tone: within normal limits Gait & Station: normal Patient leans: N/A  Psychiatric Specialty Exam: Insomnia Depression: sit still there but no better or no worse than last visit.   Depression  Associated symptoms include insomnia.  Associated symptoms include no suicidal ideas.  Past medical history includes anxiety.   Anxiety Symptoms include insomnia. Patient reports no nervous/anxious behavior or suicidal ideas.      Review of Systems  Psychiatric/Behavioral: Negative for suicidal ideas, hallucinations, memory loss and substance abuse. Depression: sit still there but no better or no worse than last visit. The patient has insomnia. The patient is not nervous/anxious.   All other systems reviewed and are negative.   There were no vitals taken for this visit.There is no weight on file to calculate BMI.  General Appearance: Well Groomed  Eye Contact:  Good  Speech:  Normal Rate  Volume:  Normal   Mood:  Good  Affect:  Congruent  Thought Process:  Coherent  Orientation:  Full (Time, Place, and Person)  Thought Content:  Negative  Suicidal Thoughts:  No  Homicidal Thoughts:  No  Memory:  Immediate;   Good Recent;   Good Remote;   Good  Judgement:  Good  Insight:  Good  Psychomotor Activity:  Negative  Concentration:  Good  Recall:  Good  Fund of Knowledge: Good  Language: Good  Akathisia:  Negative  Handed:  Right unknown   AIMS (if indicated): N/A  Assets:  Communication Skills Desire for Improvement Vocational/Educational  ADL's:  Intact  Cognition: WNL  Sleep:  Good with medicine   Is the patient at risk to self?  No. Has the patient been a risk to self in the past 6 months?  No. Has the patient been a risk to self within the distant past?  No. Is the patient a risk to others?  No. Has the patient been a risk to others in the past 6 months?  No. Has the patient been a risk to others within the distant past?  No.  Current Medications: Current Outpatient Prescriptions  Medication Sig Dispense Refill  . amphetamine-dextroamphetamine (ADDERALL) 15 MG tablet Take 1 tablet by mouth daily. 30 tablet 0  . cetirizine (ZYRTEC) 10 MG tablet Take 10 mg by mouth daily.    . citalopram (CELEXA) 40 MG tablet Take 1 tablet (40 mg total) by mouth daily. 30 tablet 1  . clonazePAM (KLONOPIN) 0.5 MG tablet Take 1 tablet (0.5 mg total) by mouth at bedtime. 30 tablet 0  . clonazePAM (KLONOPIN) 0.5 MG tablet Take 1 tablet (0.5 mg total) by mouth at bedtime. 30 tablet 0  . cyanocobalamin (,VITAMIN B-12,) 1000 MCG/ML injection Inject 1 mL (1,000 mcg total) into the muscle every 30 (thirty) days. 1 mL 11  . cyanocobalamin (,VITAMIN B-12,) 1000 MCG/ML injection Inject 1 mL (1,000 mcg total) into the muscle once. 1 mL 0  . fluticasone (FLONASE) 50 MCG/ACT nasal spray Place 2 sprays into both nostrils daily. 16 g 11  . Linaclotide (LINZESS) 290 MCG CAPS capsule Take 1 capsule (290 mcg  total) by mouth daily. 30 capsule 6  . ondansetron (ZOFRAN) 8 MG tablet Take 1 tablet (8 mg total) by mouth every 8 (eight) hours as needed for nausea or vomiting. 30 tablet 3   No current facility-administered medications for this visit.    Medical Decision Making:  Established Problem, Stable/Improving (1), Review of Medication Regimen & Side Effects (2) and Review of New Medication or Change in Dosage (2)  Treatment Plan Summary:Medication management and Plan  He will continue on the medication as follows  Klonopin 0.5 mg by mouth daily at bedtime Adderall 15 mg by mouth daily Celexa 40 mg  daily and 2 month supply of the medication was given. Will call for the refill of Klonopin and Adderall before her next appointment  Advised patient to start doing therapy but she reported that she does not have co-pay to afford the therapy on a monthly basis  Follow-up in  2 months or earlier    More than 50% of the time spent in psychoeducation, counseling and coordination of care.    This note was generated in part or whole with voice recognition software. Voice regonition is usually quite accurate but there are transcription errors that can and very often do occur. I apologize for any typographical errors that were not detected and corrected.    Brandy Hale, MD  04/10/2016, 3:12 PM

## 2016-05-16 ENCOUNTER — Other Ambulatory Visit: Payer: Self-pay | Admitting: Physician Assistant

## 2016-05-16 DIAGNOSIS — Z1231 Encounter for screening mammogram for malignant neoplasm of breast: Secondary | ICD-10-CM

## 2016-05-28 ENCOUNTER — Other Ambulatory Visit: Payer: Self-pay

## 2016-05-28 MED ORDER — AMPHETAMINE-DEXTROAMPHETAMINE 15 MG PO TABS: 15.0000 mg | ORAL_TABLET | Freq: Every day | ORAL | 0 refills | Status: DC

## 2016-05-28 NOTE — Telephone Encounter (Signed)
spoke with patient rx ready for pick up adderall 15mg  id# [id number] order # [id number] .  pt also reminded to make appt when she comes in .

## 2016-05-28 NOTE — Telephone Encounter (Signed)
fyi pt will make an appt when she comes in to pick up rx. she has to check her calender. .Marland Kitchen

## 2016-05-28 NOTE — Telephone Encounter (Signed)
pt needs refill on adderall pt will not have enought for next appt.

## 2016-05-29 ENCOUNTER — Ambulatory Visit: Payer: Self-pay | Admitting: Physician Assistant

## 2016-05-29 ENCOUNTER — Ambulatory Visit: Payer: 59

## 2016-05-29 VITALS — BP 126/80 | Temp 98.2°F

## 2016-05-29 DIAGNOSIS — E538 Deficiency of other specified B group vitamins: Secondary | ICD-10-CM

## 2016-05-29 MED ORDER — CYANOCOBALAMIN 1000 MCG/ML IJ SOLN
1000.0000 ug | Freq: Once | INTRAMUSCULAR | Status: AC
  Administered 2016-05-29: 1000 ug via INTRAMUSCULAR

## 2016-06-06 ENCOUNTER — Ambulatory Visit: Payer: Self-pay | Admitting: Physician Assistant

## 2016-06-14 ENCOUNTER — Ambulatory Visit: Payer: Self-pay | Admitting: Physician Assistant

## 2016-06-14 ENCOUNTER — Other Ambulatory Visit: Payer: Self-pay | Admitting: Physician Assistant

## 2016-06-14 ENCOUNTER — Ambulatory Visit
Admission: RE | Admit: 2016-06-14 | Discharge: 2016-06-14 | Disposition: A | Discharge status: Home / Self Care | Payer: 59 | Source: Ambulatory Visit | Attending: Physician Assistant | Admitting: Physician Assistant

## 2016-06-14 ENCOUNTER — Encounter: Payer: Self-pay | Admitting: Physician Assistant

## 2016-06-14 VITALS — BP 142/90 | HR 76 | Temp 98.2°F

## 2016-06-14 DIAGNOSIS — Z1231 Encounter for screening mammogram for malignant neoplasm of breast: Secondary | ICD-10-CM

## 2016-06-14 DIAGNOSIS — N6452 Nipple discharge: Secondary | ICD-10-CM

## 2016-06-14 NOTE — Progress Notes (Signed)
S: c/o having bloody discharge from her right nipple on Saturday (6 days ago) hasn't noticed any before or any since then, no changes in her breast that she's aware of , doesn't do monthly exams, + smoker, strong family hx of breast CA, mgm, maternal aunts x3, and a maternal cousin all had breast CA, all except cousin died of metastatic breast ca  O: vitals wnl, nad, skin on breasts appears intact, no wounds or cracked skin at nipples, no dimpling noted, breast exam is normal, no nodules noted, n/v intact  A: bloody nipple discharge  P: dx mammogram with us ordered, pt to f/u with gyn for vaginal issues and eval of bloody nipple discharge, will refer to surgeon if needed after mammogram results

## 2016-06-19 ENCOUNTER — Ambulatory Visit: Payer: Self-pay | Admitting: Internal Medicine

## 2016-06-19 NOTE — Progress Notes (Signed)
[facility] contacted office appt scheduled for 07/01/2016 @ 11:00.  Patient was contacted and notified

## 2016-06-25 ENCOUNTER — Encounter: Payer: Self-pay | Admitting: Psychiatry

## 2016-06-25 ENCOUNTER — Ambulatory Visit (INDEPENDENT_AMBULATORY_CARE_PROVIDER_SITE_OTHER): Payer: 59 | Admitting: Psychiatry

## 2016-06-25 VITALS — BP 138/76 | HR 75 | Temp 97.4°F | Ht 65.0 in | Wt 148.6 lb

## 2016-06-25 DIAGNOSIS — F900 Attention-deficit hyperactivity disorder, predominantly inattentive type: Secondary | ICD-10-CM | POA: Diagnosis not present

## 2016-06-25 DIAGNOSIS — F331 Major depressive disorder, recurrent, moderate: Secondary | ICD-10-CM

## 2016-06-25 MED ORDER — AMPHETAMINE-DEXTROAMPHETAMINE 15 MG PO TABS: 15.0000 mg | ORAL_TABLET | Freq: Every day | ORAL | 0 refills | Status: DC

## 2016-06-25 MED ORDER — CITALOPRAM HYDROBROMIDE 40 MG PO TABS: 40.0000 mg | ORAL_TABLET | Freq: Every day | ORAL | 1 refills | Status: DC

## 2016-06-25 MED ORDER — CLONAZEPAM 0.5 MG PO TABS: 0.5000 mg | ORAL_TABLET | Freq: Every day | ORAL | 1 refills | Status: DC

## 2016-06-25 NOTE — Progress Notes (Signed)
BH MD/PA/NP OP Progress Note  06/25/2016 3:03 PM Dana Benson  MRN:  [id number]  Subjective:  Patient is a 54 year old female who presented for follow-up appointment. She reported that she is coming back from work. She reported that she is coming here for the refill of her medications. Patient stated that she has been sleeping well and has been compliant with her medication. She works in [employer]. Patient stated that she feels that the medications are helping her. She denied having any acute symptoms. She was focused on getting a higher dose of the Adderall as she feels that her symptoms are not controlled. However she does not have any acute symptoms at this time. She has been sleeping with the help of Klonopin at bedtime. 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      Chief Complaint:  Chief Complaint    Follow-up; Medication Refill     Visit Diagnosis:     ICD-9-CM ICD-10-CM   1. Major depressive disorder, recurrent episode, moderate (HCC) 296.32 F33.1   2. Attention deficit hyperactivity disorder (ADHD), predominantly inattentive type 314.01 F90.0     Past Medical History:  Past Medical History:  Diagnosis Date  . ADD (attention deficit disorder with hyperactivity)   . Allergic rhinitis   . Anxiety   . Depression   . GERD (gastroesophageal reflux disease)   . History of chicken pox     Past Surgical History:  Procedure Laterality Date  . APPENDECTOMY  1979  . BLADDER SUSPENSION  1995   during hysterectomy  . CHOLECYSTECTOMY  1985  . TONSILLECTOMY  1970  . VAGINAL HYSTERECTOMY  1995   Family History:  Family History  Problem Relation Age of Onset  . Depression Mother   . Hypertension Mother   . Diabetes Mother   . Depression Sister   . Anxiety disorder Sister   . ADD / ADHD Sister   . Depression Son   . Depression Maternal Aunt   . Breast cancer Maternal Aunt 52    40  . Breast cancer Maternal Grandmother 64   Social History:  Social  History   Social History  . Marital status: Single    Spouse name: N/A  . Number of children: 3  . Years of education: N/A   Occupational History  .  [employer]   Social History Main Topics  . Smoking status: Current Every Day Smoker    Packs/day: 0.50    Years: 20.00    Types: Cigarettes  . Smokeless tobacco: Never Used     Comment: 2 packs a week  . Alcohol use 0.6 - 1.2 oz/week    1 - 2 Glasses of wine per week     Comment: Wine HS occasional  . Drug use: No  . Sexual activity: No   Other Topics Concern  . None   Social History Narrative   Lives in [place]. Works at [employer].   Additional History:   Assessment:   Musculoskeletal: Strength & Muscle Tone: within normal limits Gait & Station: normal Patient leans: N/A  Psychiatric Specialty Exam: Insomnia  Depression: sit still there but no better or no worse than last visit.   Depression         Associated symptoms include insomnia.  Associated symptoms include no suicidal ideas.  Past medical history includes anxiety.   Anxiety  Symptoms include insomnia. Patient reports no nervous/anxious behavior or suicidal ideas.    Medication Refill  Review of Systems  Psychiatric/Behavioral: Negative for hallucinations, memory loss, substance abuse and suicidal ideas. Depression: sit still there but no better or no worse than last visit. The patient has insomnia. The patient is not nervous/anxious.   All other systems reviewed and are negative.   Blood pressure 138/76, pulse 75, temperature 97.4 F (36.3 C), temperature source Oral, height 5\' 5"  (1.651 m), weight 148 lb 9.6 oz (67.4 kg).Body mass index is 24.73 kg/m.  General Appearance: Well Groomed  Eye Contact:  Good  Speech:  Normal Rate  Volume:  Normal  Mood:  Good  Affect:  Congruent  Thought Process:  Coherent  Orientation:  Full (Time, Place, and Person)  Thought Content:  Negative  Suicidal Thoughts:  No  Homicidal Thoughts:  No  Memory:  Immediate;    Good Recent;   Good Remote;   Good  Judgement:  Good  Insight:  Good  Psychomotor Activity:  Negative  Concentration:  Good  Recall:  Good  Fund of Knowledge: Good  Language: Good  Akathisia:  Negative  Handed:  Right unknown   AIMS (if indicated): N/A  Assets:  Communication Skills Desire for Improvement Vocational/Educational  ADL's:  Intact  Cognition: WNL  Sleep:  Good with medicine   Is the patient at risk to self?  No. Has the patient been a risk to self in the past 6 months?  No. Has the patient been a risk to self within the distant past?  No. Is the patient a risk to others?  No. Has the patient been a risk to others in the past 6 months?  No. Has the patient been a risk to others within the distant past?  No.  Current Medications: Current Outpatient Prescriptions  Medication Sig Dispense Refill  . amphetamine-dextroamphetamine (ADDERALL) 15 MG tablet Take 1 tablet by mouth daily. To be filled 07/24/16 30 tablet 0  . cetirizine (ZYRTEC) 10 MG tablet Take 10 mg by mouth daily.    . citalopram (CELEXA) 40 MG tablet Take 1 tablet (40 mg total) by mouth daily. 30 tablet 1  . clonazePAM (KLONOPIN) 0.5 MG tablet Take 1 tablet (0.5 mg total) by mouth at bedtime. 30 tablet 1  . cyanocobalamin (,VITAMIN B-12,) 1000 MCG/ML injection Inject 1 mL (1,000 mcg total) into the muscle every 30 (thirty) days. 1 mL 11  . cyanocobalamin (,VITAMIN B-12,) 1000 MCG/ML injection Inject 1 mL (1,000 mcg total) into the muscle once. 1 mL 0  . fluticasone (FLONASE) 50 MCG/ACT nasal spray Place 2 sprays into both nostrils daily. 16 g 11  . Linaclotide (LINZESS) 290 MCG CAPS capsule Take 1 capsule (290 mcg total) by mouth daily. 30 capsule 6  . ondansetron (ZOFRAN) 8 MG tablet Take 1 tablet (8 mg total) by mouth every 8 (eight) hours as needed for nausea or vomiting. 30 tablet 3   No current facility-administered medications for this visit.     Medical Decision Making:  Established Problem,  Stable/Improving (1), Review of Medication Regimen & Side Effects (2) and Review of New Medication or Change in Dosage (2)  Treatment Plan Summary:Medication management and Plan  He will continue on the medication as follows  Klonopin 0.5 mg by mouth daily at bedtime Adderall 15 mg by mouth daily Celexa 40 mg daily   2 month supply of the medication was given.    Follow-up in  2 months or earlier    More than 50% of the time spent in psychoeducation, counseling and coordination of  care.    This note was generated in part or whole with voice recognition software. Voice regonition is usually quite accurate but there are transcription errors that can and very often do occur. I apologize for any typographical errors that were not detected and corrected.    Brandy HaleUzma Nitika Jackowski, MD  06/25/2016, 3:03 PM

## 2016-06-27 ENCOUNTER — Ambulatory Visit: Payer: Self-pay | Admitting: Physician Assistant

## 2016-06-27 VITALS — BP 90/62 | Temp 98.5°F

## 2016-06-27 DIAGNOSIS — E538 Deficiency of other specified B group vitamins: Secondary | ICD-10-CM

## 2016-06-27 MED ORDER — CYANOCOBALAMIN 1000 MCG/ML IJ SOLN: 1000.0000 ug | Freq: Once | INTRAMUSCULAR | 0 refills | Status: AC

## 2016-06-27 MED ORDER — CYANOCOBALAMIN 1000 MCG/ML IJ SOLN
1000.0000 ug | Freq: Once | INTRAMUSCULAR | Status: AC
  Administered 2016-06-27: 1000 ug via INTRAMUSCULAR

## 2016-06-27 NOTE — Progress Notes (Signed)
Patient in office today for B12 injection only . Given IM in left glut.

## 2016-06-27 NOTE — Addendum Note (Signed)
Addended by: Faythe GheeFISHER, Josuel Koeppen W on: 06/27/2016 09:04 AM   Modules accepted: Orders

## 2016-07-01 ENCOUNTER — Ambulatory Visit
Admission: RE | Admit: 2016-07-01 | Discharge: 2016-07-01 | Disposition: A | Discharge status: Home / Self Care | Payer: 59 | Source: Ambulatory Visit | Attending: Physician Assistant | Admitting: Physician Assistant

## 2016-07-01 ENCOUNTER — Ambulatory Visit: Admission: RE | Admit: 2016-07-01 | Payer: 59 | Source: Ambulatory Visit

## 2016-07-01 DIAGNOSIS — N6452 Nipple discharge: Secondary | ICD-10-CM | POA: Diagnosis not present

## 2016-07-25 ENCOUNTER — Ambulatory Visit: Payer: Self-pay | Admitting: Physician Assistant

## 2016-07-25 ENCOUNTER — Encounter: Payer: Self-pay | Admitting: Physician Assistant

## 2016-07-25 VITALS — BP 110/70 | Temp 98.2°F

## 2016-07-25 DIAGNOSIS — E538 Deficiency of other specified B group vitamins: Secondary | ICD-10-CM

## 2016-07-25 MED ORDER — CYANOCOBALAMIN 1000 MCG/ML IJ SOLN
1000.0000 ug | Freq: Once | INTRAMUSCULAR | Status: AC
  Administered 2016-07-25: 1000 ug via INTRAMUSCULAR

## 2016-07-25 MED ORDER — CYANOCOBALAMIN 1000 MCG/ML IJ SOLN: 1000.0000 ug | Freq: Once | INTRAMUSCULAR | 0 refills | Status: AC

## 2016-07-25 MED ORDER — CYANOCOBALAMIN 1000 MCG/ML IJ SOLN: 1000.0000 ug | Freq: Once | INTRAMUSCULAR | 0 refills | Status: DC

## 2016-07-25 NOTE — Progress Notes (Signed)
Patient in office today for B12 injection only. Given IM Left gluteal. Last vial was given patient is aware to bring new vial to office for next injection. ZOX#[id number]#[id number]  Exp 03/2017

## 2016-08-21 ENCOUNTER — Ambulatory Visit: Payer: Self-pay | Admitting: Physician Assistant

## 2016-08-21 ENCOUNTER — Other Ambulatory Visit: Payer: Self-pay | Admitting: Psychiatry

## 2016-08-21 ENCOUNTER — Telehealth: Payer: Self-pay | Admitting: Psychiatry

## 2016-08-21 MED ORDER — CITALOPRAM HYDROBROMIDE 40 MG PO TABS: 40.0000 mg | ORAL_TABLET | Freq: Every day | ORAL | 1 refills | Status: DC

## 2016-08-21 MED ORDER — AMPHETAMINE-DEXTROAMPHETAMINE 15 MG PO TABS: 15.0000 mg | ORAL_TABLET | Freq: Every day | ORAL | 0 refills | Status: DC

## 2016-08-21 MED ORDER — CLONAZEPAM 0.5 MG PO TABS: 0.5000 mg | ORAL_TABLET | Freq: Every day | ORAL | 0 refills | Status: DC

## 2016-08-21 NOTE — Telephone Encounter (Signed)
Refilled Adderall and Klonopin for 1 months supply as pt rescheduled her appt for Dec. She will come and pick up today.

## 2016-08-26 ENCOUNTER — Ambulatory Visit: Payer: 59 | Admitting: Psychiatry

## 2016-08-29 ENCOUNTER — Ambulatory Visit: Payer: Self-pay | Admitting: Physician Assistant

## 2016-08-29 VITALS — BP 114/78 | HR 76 | Temp 98.2°F

## 2016-08-29 DIAGNOSIS — D510 Vitamin B12 deficiency anemia due to intrinsic factor deficiency: Secondary | ICD-10-CM

## 2016-08-29 MED ORDER — CYANOCOBALAMIN 1000 MCG/ML IJ SOLN
1000.0000 ug | Freq: Once | INTRAMUSCULAR | Status: AC
  Administered 2016-08-29: 1000 ug via INTRAMUSCULAR

## 2016-08-29 NOTE — Progress Notes (Signed)
Patient in office today for B12 injection only. Given in left gluteal  Lot #[id number]  Exp 06/2017

## 2016-09-06 ENCOUNTER — Telehealth: Payer: Self-pay | Admitting: Physician Assistant

## 2016-09-06 NOTE — Telephone Encounter (Signed)
Noted that patient refusing mri and treatment

## 2016-09-11 ENCOUNTER — Ambulatory Visit (INDEPENDENT_AMBULATORY_CARE_PROVIDER_SITE_OTHER): Payer: 59 | Admitting: Psychiatry

## 2016-09-11 ENCOUNTER — Encounter: Payer: Self-pay | Admitting: Psychiatry

## 2016-09-11 DIAGNOSIS — F331 Major depressive disorder, recurrent, moderate: Secondary | ICD-10-CM

## 2016-09-11 DIAGNOSIS — F900 Attention-deficit hyperactivity disorder, predominantly inattentive type: Secondary | ICD-10-CM | POA: Diagnosis not present

## 2016-09-11 MED ORDER — CLONAZEPAM 0.5 MG PO TABS: 0.5000 mg | ORAL_TABLET | Freq: Every day | ORAL | 1 refills | Status: DC

## 2016-09-11 MED ORDER — AMPHETAMINE-DEXTROAMPHETAMINE 15 MG PO TABS: 15.0000 mg | ORAL_TABLET | Freq: Every day | ORAL | 0 refills | Status: DC

## 2016-09-11 MED ORDER — CITALOPRAM HYDROBROMIDE 40 MG PO TABS: 40.0000 mg | ORAL_TABLET | Freq: Every day | ORAL | 1 refills | Status: DC

## 2016-09-11 NOTE — Progress Notes (Signed)
BH MD/PA/NP OP Progress Note  09/11/2016 12:32 PM Dana Benson  MRN:  [id number]  Subjective:  Patient is a 55 year old female who presented for follow-up appointment. She reported that she is coming back from work. She is doing well on her medications and has been taking Adderall on a daily basis. She reported that she has been taking Celexa and Klonopin at bedtime. She currently denied having any side effects from medications. She denied having any suicidal ideations or plans. She appeared calm and collective. Reported  that she is planning to spend her holidays with her family members. 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      Chief Complaint:  Chief Complaint    Follow-up; Medication Refill     Visit Diagnosis:     ICD-9-CM ICD-10-CM   1. Major depressive disorder, recurrent episode, moderate (HCC) 296.32 F33.1   2. Attention deficit hyperactivity disorder (ADHD), predominantly inattentive type 314.00 F90.0     Past Medical History:  Past Medical History:  Diagnosis Date  . ADD (attention deficit disorder with hyperactivity)   . Allergic rhinitis   . Anxiety   . Depression   . GERD (gastroesophageal reflux disease)   . History of chicken pox     Past Surgical History:  Procedure Laterality Date  . APPENDECTOMY  1979  . BLADDER SUSPENSION  1995   during hysterectomy  . CHOLECYSTECTOMY  1985  . TONSILLECTOMY  1970  . VAGINAL HYSTERECTOMY  1995   Family History:  Family History  Problem Relation Age of Onset  . Depression Mother   . Hypertension Mother   . Diabetes Mother   . Depression Sister   . Anxiety disorder Sister   . ADD / ADHD Sister   . Depression Son   . Depression Maternal Aunt   . Breast cancer Maternal Aunt 44  . Breast cancer Maternal Grandmother 60  . Breast cancer Cousin 44  . Breast cancer Maternal Aunt 109   Social History:  Social History   Social History  . Marital status: Single    Spouse name: N/A  . Number of  children: 3  . Years of education: N/A   Occupational History  .  [employer]   Social History Main Topics  . Smoking status: Current Every Day Smoker    Packs/day: 0.50    Years: 20.00    Types: Cigarettes  . Smokeless tobacco: Never Used     Comment: 2 packs a week  . Alcohol use 0.6 - 1.2 oz/week    1 - 2 Glasses of wine per week     Comment: Wine HS occasional  . Drug use: No  . Sexual activity: No   Other Topics Concern  . None   Social History Narrative   Lives in [place]. Works at [employer].   Additional History:   Assessment:   Musculoskeletal: Strength & Muscle Tone: within normal limits Gait & Station: normal Patient leans: N/A  Psychiatric Specialty Exam: Medication Refill   Insomnia  Depression: sit still there but no better or no worse than last visit.   Depression         Associated symptoms include insomnia.  Associated symptoms include no suicidal ideas.  Past medical history includes anxiety.   Anxiety  Symptoms include insomnia. Patient reports no nervous/anxious behavior or suicidal ideas.      Review of Systems  Psychiatric/Behavioral: Negative for hallucinations, memory loss, substance abuse and suicidal ideas. Depression: sit still there but  no better or no worse than last visit. The patient has insomnia. The patient is not nervous/anxious.   All other systems reviewed and are negative.   There were no vitals taken for this visit.There is no height or weight on file to calculate BMI.  General Appearance: Well Groomed  Eye Contact:  Good  Speech:  Normal Rate  Volume:  Normal  Mood:  Good  Affect:  Congruent  Thought Process:  Coherent  Orientation:  Full (Time, Place, and Person)  Thought Content:  Negative  Suicidal Thoughts:  No  Homicidal Thoughts:  No  Memory:  Immediate;   Good Recent;   Good Remote;   Good  Judgement:  Good  Insight:  Good  Psychomotor Activity:  Negative  Concentration:  Good  Recall:  Good  Fund of Knowledge:  Good  Language: Good  Akathisia:  Negative  Handed:  Right unknown   AIMS (if indicated): N/A  Assets:  Communication Skills Desire for Improvement Vocational/Educational  ADL's:  Intact  Cognition: WNL  Sleep:  Good with medicine   Is the patient at risk to self?  No. Has the patient been a risk to self in the past 6 months?  No. Has the patient been a risk to self within the distant past?  No. Is the patient a risk to others?  No. Has the patient been a risk to others in the past 6 months?  No. Has the patient been a risk to others within the distant past?  No.  Current Medications: Current Outpatient Prescriptions  Medication Sig Dispense Refill  . amphetamine-dextroamphetamine (ADDERALL) 15 MG tablet Take 1 tablet by mouth daily. To be filled 09/22/16 30 tablet 0  . amphetamine-dextroamphetamine (ADDERALL) 15 MG tablet Take 1 tablet by mouth daily. To be filled 10/22/16 30 tablet 0  . cetirizine (ZYRTEC) 10 MG tablet Take 10 mg by mouth daily.    . citalopram (CELEXA) 40 MG tablet Take 1 tablet (40 mg total) by mouth daily. 30 tablet 1  . clonazePAM (KLONOPIN) 0.5 MG tablet Take 1 tablet (0.5 mg total) by mouth at bedtime. 30 tablet 1  . cyanocobalamin (,VITAMIN B-12,) 1000 MCG/ML injection Inject 1 mL (1,000 mcg total) into the muscle every 30 (thirty) days. 1 mL 11  . cyanocobalamin (,VITAMIN B-12,) 1000 MCG/ML injection Inject 1 mL (1,000 mcg total) into the muscle once. 1 mL 0  . fluticasone (FLONASE) 50 MCG/ACT nasal spray Place 2 sprays into both nostrils daily. 16 g 11  . Linaclotide (LINZESS) 290 MCG CAPS capsule Take 1 capsule (290 mcg total) by mouth daily. 30 capsule 6  . ondansetron (ZOFRAN) 8 MG tablet Take 1 tablet (8 mg total) by mouth every 8 (eight) hours as needed for nausea or vomiting. 30 tablet 3   No current facility-administered medications for this visit.     Medical Decision Making:  Established Problem, Stable/Improving (1), Review of Medication  Regimen & Side Effects (2) and Review of New Medication or Change in Dosage (2)  Treatment Plan Summary:Medication management and Plan  He will continue on the medication as follows  Klonopin 0.5 mg by mouth daily at bedtime Adderall 15 mg by mouth daily Celexa 40 mg daily   2 month supply of the medication was given.    Follow-up in  2 months or earlier    More than 50% of the time spent in psychoeducation, counseling and coordination of care.    This note was generated in part or whole  with voice recognition software. Voice regonition is usually quite accurate but there are transcription errors that can and very often do occur. I apologize for any typographical errors that were not detected and corrected.    Brandy HaleUzma Razia Screws, MD  09/11/2016, 12:32 PM

## 2016-09-19 ENCOUNTER — Ambulatory Visit: Payer: Self-pay | Admitting: Physician Assistant

## 2016-09-19 VITALS — BP 120/82 | HR 80 | Temp 97.9°F

## 2016-09-19 DIAGNOSIS — D510 Vitamin B12 deficiency anemia due to intrinsic factor deficiency: Secondary | ICD-10-CM

## 2016-09-19 DIAGNOSIS — E538 Deficiency of other specified B group vitamins: Secondary | ICD-10-CM

## 2016-09-19 MED ORDER — CYANOCOBALAMIN 1000 MCG/ML IJ SOLN
1000.0000 ug | Freq: Once | INTRAMUSCULAR | Status: AC
  Administered 2016-09-19: 1000 ug via INTRAMUSCULAR

## 2016-09-19 NOTE — Progress Notes (Signed)
Patient in office today for B12 injection only. Given IM lot [id number]  exp04/19

## 2016-10-11 ENCOUNTER — Telehealth: Payer: Self-pay | Admitting: Internal Medicine

## 2016-10-11 NOTE — Telephone Encounter (Signed)
Pt was undecided as which provider she wanted to go with. Pt will call back. Thank You!

## 2016-10-15 DIAGNOSIS — H5203 Hypermetropia, bilateral: Secondary | ICD-10-CM | POA: Diagnosis not present

## 2016-11-13 ENCOUNTER — Ambulatory Visit (INDEPENDENT_AMBULATORY_CARE_PROVIDER_SITE_OTHER): Payer: 59 | Admitting: Psychiatry

## 2016-11-13 ENCOUNTER — Encounter: Payer: Self-pay | Admitting: Psychiatry

## 2016-11-13 VITALS — Temp 97.8°F | Wt 161.6 lb

## 2016-11-13 DIAGNOSIS — F331 Major depressive disorder, recurrent, moderate: Secondary | ICD-10-CM | POA: Diagnosis not present

## 2016-11-13 DIAGNOSIS — F900 Attention-deficit hyperactivity disorder, predominantly inattentive type: Secondary | ICD-10-CM | POA: Diagnosis not present

## 2016-11-13 MED ORDER — CLONAZEPAM 0.5 MG PO TABS: 0.5000 mg | ORAL_TABLET | Freq: Every day | ORAL | 1 refills | Status: DC

## 2016-11-13 MED ORDER — CITALOPRAM HYDROBROMIDE 40 MG PO TABS: 40.0000 mg | ORAL_TABLET | Freq: Every day | ORAL | 1 refills | Status: DC

## 2016-11-13 MED ORDER — AMPHETAMINE-DEXTROAMPHETAMINE 15 MG PO TABS: 15.0000 mg | ORAL_TABLET | Freq: Every day | ORAL | 0 refills | Status: DC

## 2016-11-13 NOTE — Progress Notes (Signed)
BH MD/PA/NP OP Progress Note  11/13/2016 12:20 PM Dana Benson  MRN:  [id number]  Subjective:  Patient is a 56 year old female who presented for follow-up appointment. She reported that she is doing well on her medications. She reported that she is having problems with sleep at night and was interested in increasing the dose of the Klonopin. We discussed about her medications in detail. She agreed to add the melatonin at night. She reported that she takes Adderall half pill twice daily. It might be affecting her sleep. She has been compliant with her medications. She enjoys her work. No acute issues noted. She appears calm and alert. She has been trying to lose weight. She denied having any suicidal homicidal ideations or plans. She denies having any perceptual disturbances. She is looking forward to the spring so she can take some time off and go to the beach.       Chief Complaint:  Chief Complaint    Follow-up; Medication Refill     Visit Diagnosis:     ICD-9-CM ICD-10-CM   1. Major depressive disorder, recurrent episode, moderate (HCC) 296.32 F33.1   2. Attention deficit hyperactivity disorder (ADHD), predominantly inattentive type 314.00 F90.0     Past Medical History:  Past Medical History:  Diagnosis Date  . ADD (attention deficit disorder with hyperactivity)   . Allergic rhinitis   . Anxiety   . Depression   . GERD (gastroesophageal reflux disease)   . History of chicken pox     Past Surgical History:  Procedure Laterality Date  . APPENDECTOMY  1979  . BLADDER SUSPENSION  1995   during hysterectomy  . CHOLECYSTECTOMY  1985  . TONSILLECTOMY  1970  . VAGINAL HYSTERECTOMY  1995   Family History:  Family History  Problem Relation Age of Onset  . Depression Mother   . Hypertension Mother   . Diabetes Mother   . Depression Sister   . Anxiety disorder Sister   . ADD / ADHD Sister   . Depression Son   . Depression Maternal Aunt   . Breast cancer Maternal Aunt 44   . Breast cancer Maternal Grandmother 60  . Breast cancer Cousin 44  . Breast cancer Maternal Aunt 36   Social History:  Social History   Social History  . Marital status: Single    Spouse name: N/A  . Number of children: 3  . Years of education: N/A   Occupational History  .  [employer]   Social History Main Topics  . Smoking status: Current Every Day Smoker    Packs/day: 0.50    Years: 20.00    Types: Cigarettes  . Smokeless tobacco: Never Used     Comment: 2 packs a week  . Alcohol use 0.6 - 1.2 oz/week    1 - 2 Glasses of wine per week     Comment: Wine HS occasional  . Drug use: No  . Sexual activity: No   Other Topics Concern  . None   Social History Narrative   Lives in [place]. Works at [employer].   Additional History:   Assessment:   Musculoskeletal: Strength & Muscle Tone: within normal limits Gait & Station: normal Patient leans: N/A  Psychiatric Specialty Exam: Medication Refill   Insomnia  Depression: sit still there but no better or no worse than last visit.   Depression         Associated symptoms include insomnia.  Associated symptoms include no suicidal ideas.  Past medical history includes  anxiety.   Anxiety  Symptoms include insomnia. Patient reports no nervous/anxious behavior or suicidal ideas.      Review of Systems  Psychiatric/Behavioral: Negative for hallucinations, memory loss, substance abuse and suicidal ideas. Depression: sit still there but no better or no worse than last visit. The patient has insomnia. The patient is not nervous/anxious.   All other systems reviewed and are negative.   Temperature 97.8 F (36.6 C), temperature source Oral, weight 161 lb 9.6 oz (73.3 kg).Body mass index is 26.89 kg/m.  General Appearance: Well Groomed  Eye Contact:  Good  Speech:  Normal Rate  Volume:  Normal  Mood:  Good  Affect:  Congruent  Thought Process:  Coherent  Orientation:  Full (Time, Place, and Person)  Thought Content:   Negative  Suicidal Thoughts:  No  Homicidal Thoughts:  No  Memory:  Immediate;   Good Recent;   Good Remote;   Good  Judgement:  Good  Insight:  Good  Psychomotor Activity:  Negative  Concentration:  Good  Recall:  Good  Fund of Knowledge: Good  Language: Good  Akathisia:  Negative  Handed:  Right unknown   AIMS (if indicated): N/A  Assets:  Communication Skills Desire for Improvement Vocational/Educational  ADL's:  Intact  Cognition: WNL  Sleep:  Good with medicine   Is the patient at risk to self?  No. Has the patient been a risk to self in the past 6 months?  No. Has the patient been a risk to self within the distant past?  No. Is the patient a risk to others?  No. Has the patient been a risk to others in the past 6 months?  No. Has the patient been a risk to others within the distant past?  No.  Current Medications: Current Outpatient Prescriptions  Medication Sig Dispense Refill  . amphetamine-dextroamphetamine (ADDERALL) 15 MG tablet Take 1 tablet by mouth daily. 30 tablet 0  . amphetamine-dextroamphetamine (ADDERALL) 15 MG tablet Take 1 tablet by mouth daily. To be filled 12/20/16 30 tablet 0  . cetirizine (ZYRTEC) 10 MG tablet Take 10 mg by mouth daily.    . citalopram (CELEXA) 40 MG tablet Take 1 tablet (40 mg total) by mouth daily. 30 tablet 1  . clonazePAM (KLONOPIN) 0.5 MG tablet Take 1 tablet (0.5 mg total) by mouth at bedtime. 30 tablet 1  . cyanocobalamin (,VITAMIN B-12,) 1000 MCG/ML injection Inject 1 mL (1,000 mcg total) into the muscle every 30 (thirty) days. 1 mL 11  . cyanocobalamin (,VITAMIN B-12,) 1000 MCG/ML injection Inject 1 mL (1,000 mcg total) into the muscle once. 1 mL 0  . fluticasone (FLONASE) 50 MCG/ACT nasal spray Place 2 sprays into both nostrils daily. 16 g 11  . Linaclotide (LINZESS) 290 MCG CAPS capsule Take 1 capsule (290 mcg total) by mouth daily. 30 capsule 6  . ondansetron (ZOFRAN) 8 MG tablet Take 1 tablet (8 mg total) by mouth every 8  (eight) hours as needed for nausea or vomiting. 30 tablet 3   No current facility-administered medications for this visit.     Medical Decision Making:  Established Problem, Stable/Improving (1), Review of Medication Regimen & Side Effects (2) and Review of New Medication or Change in Dosage (2)  Treatment Plan Summary:Medication management and Plan  He will continue on the medication as follows  Klonopin 0.5 mg by mouth daily at bedtime Adderall 15 mg by mouth daily Celexa 40 mg daily   2 month supply of the medication was  given.  Follow-up in  2 months or earlier    More than 50% of the time spent in psychoeducation, counseling and coordination of care.    This note was generated in part or whole with voice recognition software. Voice regonition is usually quite accurate but there are transcription errors that can and very often do occur. I apologize for any typographical errors that were not detected and corrected.    Brandy HaleUzma Angle Dirusso, MD  11/13/2016, 12:20 PM

## 2016-12-04 DIAGNOSIS — R635 Abnormal weight gain: Secondary | ICD-10-CM | POA: Diagnosis not present

## 2016-12-04 DIAGNOSIS — F418 Other specified anxiety disorders: Secondary | ICD-10-CM | POA: Diagnosis not present

## 2016-12-04 DIAGNOSIS — Z7689 Persons encountering health services in other specified circumstances: Secondary | ICD-10-CM | POA: Diagnosis not present

## 2016-12-04 DIAGNOSIS — F988 Other specified behavioral and emotional disorders with onset usually occurring in childhood and adolescence: Secondary | ICD-10-CM | POA: Diagnosis not present

## 2016-12-04 DIAGNOSIS — E538 Deficiency of other specified B group vitamins: Secondary | ICD-10-CM | POA: Diagnosis not present

## 2016-12-04 DIAGNOSIS — Z0000 Encounter for general adult medical examination without abnormal findings: Secondary | ICD-10-CM | POA: Diagnosis not present

## 2016-12-04 DIAGNOSIS — Z1159 Encounter for screening for other viral diseases: Secondary | ICD-10-CM | POA: Diagnosis not present

## 2016-12-04 DIAGNOSIS — N3946 Mixed incontinence: Secondary | ICD-10-CM | POA: Diagnosis not present

## 2017-01-20 ENCOUNTER — Ambulatory Visit (INDEPENDENT_AMBULATORY_CARE_PROVIDER_SITE_OTHER): Payer: 59 | Admitting: Psychiatry

## 2017-01-20 ENCOUNTER — Encounter: Payer: Self-pay | Admitting: Psychiatry

## 2017-01-20 VITALS — BP 115/73 | HR 80 | Temp 98.9°F | Wt 151.6 lb

## 2017-01-20 DIAGNOSIS — F502 Bulimia nervosa: Secondary | ICD-10-CM

## 2017-01-20 DIAGNOSIS — F331 Major depressive disorder, recurrent, moderate: Secondary | ICD-10-CM

## 2017-01-20 DIAGNOSIS — F900 Attention-deficit hyperactivity disorder, predominantly inattentive type: Secondary | ICD-10-CM

## 2017-01-20 MED ORDER — CITALOPRAM HYDROBROMIDE 40 MG PO TABS: 40.0000 mg | ORAL_TABLET | Freq: Every day | ORAL | 1 refills | Status: DC

## 2017-01-20 MED ORDER — AMPHETAMINE-DEXTROAMPHETAMINE 15 MG PO TABS: 15.0000 mg | ORAL_TABLET | Freq: Every day | ORAL | 0 refills | Status: DC

## 2017-01-20 MED ORDER — CLONAZEPAM 0.5 MG PO TABS
0.5000 mg | ORAL_TABLET | Freq: Every day | ORAL | 1 refills | Status: DC
Start: 2017-01-20 — End: 2017-03-19

## 2017-01-20 NOTE — Progress Notes (Signed)
BH MD/PA/NP OP Progress Note  01/20/2017 2:41 PM Dana Benson  MRN:  [id number]  Subjective:  Patient is a 56 year old female who presented for follow-up appointment. She reported that she is doing well on her medications. She reported that her work hours have changed and now she has to work long hours to 5 PM at her job. Patient reported that she has been sleeping late around 58 PM. She reported that she is compliant with her medications. She currently denied having any suicidal homicidal ideations or plans. She appeared pleasant and cooperative during the interview. She reported that she has lost some weight. She does not have any side effects related to her medications. We discussed about her medications in detail. She stated that she is trying to get all her medications refilled at the same time and is waiting for her Adderall prescription as they're all due to now.   She denies having any perceptual disturbances. She is looking forward to the spring so she can take some time off and go to the beach.       Chief Complaint:  Chief Complaint    Follow-up; Medication Refill     Visit Diagnosis:   No diagnosis found.  Past Medical History:  Past Medical History:  Diagnosis Date  . ADD (attention deficit disorder with hyperactivity)   . Allergic rhinitis   . Anxiety   . Depression   . GERD (gastroesophageal reflux disease)   . History of chicken pox     Past Surgical History:  Procedure Laterality Date  . APPENDECTOMY  1979  . BLADDER SUSPENSION  1995   during hysterectomy  . CHOLECYSTECTOMY  1985  . TONSILLECTOMY  1970  . VAGINAL HYSTERECTOMY  1995   Family History:  Family History  Problem Relation Age of Onset  . Depression Mother   . Hypertension Mother   . Diabetes Mother   . Depression Sister   . Anxiety disorder Sister   . ADD / ADHD Sister   . Depression Son   . Depression Maternal Aunt   . Breast cancer Maternal Aunt 44  . Breast cancer Maternal  Grandmother 60  . Breast cancer Cousin 44  . Breast cancer Maternal Aunt 86   Social History:  Social History   Social History  . Marital status: Single    Spouse name: N/A  . Number of children: 3  . Years of education: N/A   Occupational History  .  [employer]   Social History Main Topics  . Smoking status: Current Every Day Smoker    Packs/day: 0.50    Years: 20.00    Types: Cigarettes  . Smokeless tobacco: Never Used     Comment: 2 packs a week  . Alcohol use 0.6 - 1.2 oz/week    1 - 2 Glasses of wine per week     Comment: Wine HS occasional  . Drug use: No  . Sexual activity: No   Other Topics Concern  . None   Social History Narrative   Lives in [place]. Works at [employer].   Additional History:   Assessment:   Musculoskeletal: Strength & Muscle Tone: within normal limits Gait & Station: normal Patient leans: N/A  Psychiatric Specialty Exam: Medication Refill   Insomnia  Depression: sit still there but no better or no worse than last visit.   Depression         Associated symptoms include insomnia.  Associated symptoms include no suicidal ideas.  Past medical history includes  anxiety.   Anxiety  Symptoms include insomnia. Patient reports no nervous/anxious behavior or suicidal ideas.      Review of Systems  Psychiatric/Behavioral: Negative for hallucinations, memory loss, substance abuse and suicidal ideas. Depression: sit still there but no better or no worse than last visit. The patient has insomnia. The patient is not nervous/anxious.   All other systems reviewed and are negative.   Blood pressure 115/73, pulse 80, temperature 98.9 F (37.2 C), temperature source Oral, weight 151 lb 9.6 oz (68.8 kg).Body mass index is 25.23 kg/m.  General Appearance: Well Groomed  Eye Contact:  Good  Speech:  Normal Rate  Volume:  Normal  Mood:  Good  Affect:  Congruent  Thought Process:  Coherent  Orientation:  Full (Time, Place, and Person)  Thought Content:   Negative  Suicidal Thoughts:  No  Homicidal Thoughts:  No  Memory:  Immediate;   Good Recent;   Good Remote;   Good  Judgement:  Good  Insight:  Good  Psychomotor Activity:  Negative  Concentration:  Good  Recall:  Good  Fund of Knowledge: Good  Language: Good  Akathisia:  Negative  Handed:  Right unknown   AIMS (if indicated): N/A  Assets:  Communication Skills Desire for Improvement Vocational/Educational  ADL's:  Intact  Cognition: WNL  Sleep:  Good with medicine   Is the patient at risk to self?  No. Has the patient been a risk to self in the past 6 months?  No. Has the patient been a risk to self within the distant past?  No. Is the patient a risk to others?  No. Has the patient been a risk to others in the past 6 months?  No. Has the patient been a risk to others within the distant past?  No.  Current Medications: Current Outpatient Prescriptions  Medication Sig Dispense Refill  . amphetamine-dextroamphetamine (ADDERALL) 15 MG tablet Take 1 tablet by mouth daily. 30 tablet 0  . amphetamine-dextroamphetamine (ADDERALL) 15 MG tablet Take 1 tablet by mouth daily. To be filled 12/20/16 30 tablet 0  . cetirizine (ZYRTEC) 10 MG tablet Take 10 mg by mouth daily.    . citalopram (CELEXA) 40 MG tablet Take 1 tablet (40 mg total) by mouth daily. 30 tablet 1  . clonazePAM (KLONOPIN) 0.5 MG tablet Take 1 tablet (0.5 mg total) by mouth at bedtime. 30 tablet 1  . cyanocobalamin (,VITAMIN B-12,) 1000 MCG/ML injection Inject 1 mL (1,000 mcg total) into the muscle every 30 (thirty) days. 1 mL 11  . cyanocobalamin (,VITAMIN B-12,) 1000 MCG/ML injection Inject 1 mL (1,000 mcg total) into the muscle once. 1 mL 0  . fluticasone (FLONASE) 50 MCG/ACT nasal spray Place 2 sprays into both nostrils daily. 16 g 11  . Linaclotide (LINZESS) 290 MCG CAPS capsule Take 1 capsule (290 mcg total) by mouth daily. 30 capsule 6  . ondansetron (ZOFRAN) 8 MG tablet Take 1 tablet (8 mg total) by mouth every 8  (eight) hours as needed for nausea or vomiting. 30 tablet 3   No current facility-administered medications for this visit.     Medical Decision Making:  Established Problem, Stable/Improving (1), Review of Medication Regimen & Side Effects (2) and Review of New Medication or Change in Dosage (2)  Treatment Plan Summary:Medication management and Plan  He will continue on the medication as follows  Klonopin 0.5 mg by mouth daily at bedtime Adderall 15 mg by mouth daily Celexa 40 mg daily   2 month  supply of the medication was given.  Follow-up in  2 months or earlier    More than 50% of the time spent in psychoeducation, counseling and coordination of care.    This note was generated in part or whole with voice recognition software. Voice regonition is usually quite accurate but there are transcription errors that can and very often do occur. I apologize for any typographical errors that were not detected and corrected.    Brandy Hale, MD  01/20/2017, 2:41 PM

## 2017-02-10 ENCOUNTER — Ambulatory Visit: Payer: Self-pay | Admitting: Physician Assistant

## 2017-02-10 VITALS — BP 140/80 | HR 76 | Temp 98.6°F

## 2017-02-10 DIAGNOSIS — E538 Deficiency of other specified B group vitamins: Secondary | ICD-10-CM

## 2017-02-10 MED ORDER — CYANOCOBALAMIN 1000 MCG/ML IJ SOLN
1000.0000 ug | Freq: Once | INTRAMUSCULAR | Status: AC
  Administered 2017-02-10: 1000 ug via INTRAMUSCULAR

## 2017-02-11 NOTE — Progress Notes (Signed)
Patient in office today for B12 injection only. Given IM left gluteal

## 2017-02-14 DIAGNOSIS — N3281 Overactive bladder: Secondary | ICD-10-CM | POA: Diagnosis not present

## 2017-02-14 DIAGNOSIS — K219 Gastro-esophageal reflux disease without esophagitis: Secondary | ICD-10-CM | POA: Diagnosis not present

## 2017-02-28 ENCOUNTER — Ambulatory Visit: Payer: Self-pay | Admitting: Physician Assistant

## 2017-02-28 ENCOUNTER — Encounter: Payer: Self-pay | Admitting: Physician Assistant

## 2017-02-28 VITALS — BP 140/90 | HR 96 | Temp 98.0°F

## 2017-02-28 DIAGNOSIS — M67442 Ganglion, left hand: Secondary | ICD-10-CM

## 2017-02-28 NOTE — Progress Notes (Signed)
S: pt states her finger has been hurting then noticed a hard area on the side of her left pinky finger, no known injury, does have some arthritis, no fever/chills/swelling  O: vitals wnl, nad, skin intact, + small hard area at edge of dip, feels like small cyst, full rom of finger, no redness or swelling, no sign of infection, n/v intact  A: ganglion cyst of finger  P: nsaids, buddy tape, if getting larger can refer to ortho

## 2017-03-13 ENCOUNTER — Ambulatory Visit: Payer: Self-pay | Admitting: Physician Assistant

## 2017-03-19 ENCOUNTER — Encounter: Payer: Self-pay | Admitting: Psychiatry

## 2017-03-19 ENCOUNTER — Ambulatory Visit (INDEPENDENT_AMBULATORY_CARE_PROVIDER_SITE_OTHER): Payer: 59 | Admitting: Psychiatry

## 2017-03-19 VITALS — BP 133/80 | HR 71 | Temp 98.0°F | Wt 154.4 lb

## 2017-03-19 DIAGNOSIS — F331 Major depressive disorder, recurrent, moderate: Secondary | ICD-10-CM

## 2017-03-19 DIAGNOSIS — F900 Attention-deficit hyperactivity disorder, predominantly inattentive type: Secondary | ICD-10-CM | POA: Diagnosis not present

## 2017-03-19 MED ORDER — AMPHETAMINE-DEXTROAMPHETAMINE 15 MG PO TABS: 15.0000 mg | ORAL_TABLET | Freq: Every day | ORAL | 0 refills | Status: DC

## 2017-03-19 MED ORDER — CITALOPRAM HYDROBROMIDE 40 MG PO TABS: 40.0000 mg | ORAL_TABLET | Freq: Every day | ORAL | 1 refills | Status: DC

## 2017-03-19 MED ORDER — CLONAZEPAM 0.5 MG PO TABS: 0.5000 mg | ORAL_TABLET | Freq: Every day | ORAL | 2 refills | Status: DC

## 2017-03-19 NOTE — Progress Notes (Signed)
BH MD/PA/NP OP Progress Note  03/19/2017 3:06 PM Dana Benson  MRN:  [id number]  Subjective:  Patient is a 57 year old female who presented for follow-up appointment. She reported that she is doing well on her medications. She reported that she just came back from vacation with her sister from [place]. Patient reported that she is also relocating in the coming weekend. She reported that she has been packing up her stuff. She has been compliant with her medications. She reported that she is having difficulty sleeping at night and found an old prescription of Lunesta which has been helpful. She takes her medications as prescribed. She denied having any side effects of medications. She is also trying to lose weight. No acute symptoms noted at this time. We discussed about her medical. Patient appeared calm and alert during the interview.           Chief Complaint:  Chief Complaint    Follow-up; Medication Refill     Visit Diagnosis:     ICD-10-CM   1. Major depressive disorder, recurrent episode, moderate (HCC) F33.1   2. Attention deficit hyperactivity disorder (ADHD), predominantly inattentive type F90.0     Past Medical History:  Past Medical History:  Diagnosis Date  . ADD (attention deficit disorder with hyperactivity)   . Allergic rhinitis   . Anxiety   . Depression   . GERD (gastroesophageal reflux disease)   . History of chicken pox     Past Surgical History:  Procedure Laterality Date  . APPENDECTOMY  1979  . BLADDER SUSPENSION  1995   during hysterectomy  . CHOLECYSTECTOMY  1985  . TONSILLECTOMY  1970  . VAGINAL HYSTERECTOMY  1995   Family History:  Family History  Problem Relation Age of Onset  . Depression Mother   . Hypertension Mother   . Diabetes Mother   . Depression Sister   . Anxiety disorder Sister   . ADD / ADHD Sister   . Depression Son   . Depression Maternal Aunt   . Breast cancer Maternal Aunt 44  . Breast cancer Maternal  Grandmother 60  . Breast cancer Cousin 44  . Breast cancer Maternal Aunt 51   Social History:  Social History   Social History  . Marital status: Single    Spouse name: N/A  . Number of children: 3  . Years of education: N/A   Occupational History  .  [employer]   Social History Main Topics  . Smoking status: Current Every Day Smoker    Packs/day: 0.50    Years: 20.00    Types: Cigarettes  . Smokeless tobacco: Never Used     Comment: 2 packs a week  . Alcohol use 0.6 - 1.2 oz/week    1 - 2 Glasses of wine per week     Comment: Wine HS occasional  . Drug use: No  . Sexual activity: No   Other Topics Concern  . None   Social History Narrative   Lives in [place]. Works at [employer].   Additional History:   Assessment:   Musculoskeletal: Strength & Muscle Tone: within normal limits Gait & Station: normal Patient leans: N/A  Psychiatric Specialty Exam: Medication Refill   Insomnia  Depression: sit still there but no better or no worse than last visit.   Depression         Associated symptoms include insomnia.  Associated symptoms include no suicidal ideas.  Past medical history includes anxiety.   Anxiety  Symptoms include  insomnia. Patient reports no nervous/anxious behavior or suicidal ideas.      Review of Systems  Psychiatric/Behavioral: Negative for hallucinations, memory loss, substance abuse and suicidal ideas. Depression: sit still there but no better or no worse than last visit. The patient has insomnia. The patient is not nervous/anxious.   All other systems reviewed and are negative.   Blood pressure 133/80, pulse 71, temperature 98 F (36.7 C), temperature source Oral, weight 154 lb 6.4 oz (70 kg).Body mass index is 25.69 kg/m.  General Appearance: Well Groomed  Eye Contact:  Good  Speech:  Normal Rate  Volume:  Normal  Mood:  Good  Affect:  Congruent  Thought Process:  Coherent  Orientation:  Full (Time, Place, and Person)  Thought Content:   Negative  Suicidal Thoughts:  No  Homicidal Thoughts:  No  Memory:  Immediate;   Good Recent;   Good Remote;   Good  Judgement:  Good  Insight:  Good  Psychomotor Activity:  Negative  Concentration:  Good  Recall:  Good  Fund of Knowledge: Good  Language: Good  Akathisia:  Negative  Handed:  Right unknown   AIMS (if indicated): N/A  Assets:  Communication Skills Desire for Improvement Vocational/Educational  ADL's:  Intact  Cognition: WNL  Sleep:  Good with medicine   Is the patient at risk to self?  No. Has the patient been a risk to self in the past 6 months?  No. Has the patient been a risk to self within the distant past?  No. Is the patient a risk to others?  No. Has the patient been a risk to others in the past 6 months?  No. Has the patient been a risk to others within the distant past?  No.  Current Medications: Current Outpatient Prescriptions  Medication Sig Dispense Refill  . amphetamine-dextroamphetamine (ADDERALL) 15 MG tablet Take 1 tablet by mouth daily. 30 tablet 0  . amphetamine-dextroamphetamine (ADDERALL) 15 MG tablet Take 1 tablet by mouth daily. To be filled 04/21/17 30 tablet 0  . cetirizine (ZYRTEC) 10 MG tablet Take 10 mg by mouth daily.    . citalopram (CELEXA) 40 MG tablet Take 1 tablet (40 mg total) by mouth daily. 30 tablet 1  . clonazePAM (KLONOPIN) 0.5 MG tablet Take 1 tablet (0.5 mg total) by mouth at bedtime. 30 tablet 2  . cyanocobalamin (,VITAMIN B-12,) 1000 MCG/ML injection Inject 1 mL (1,000 mcg total) into the muscle every 30 (thirty) days. 1 mL 11  . cyanocobalamin (,VITAMIN B-12,) 1000 MCG/ML injection Inject 1 mL (1,000 mcg total) into the muscle once. 1 mL 0  . fluticasone (FLONASE) 50 MCG/ACT nasal spray Place 2 sprays into both nostrils daily. 16 g 11  . Linaclotide (LINZESS) 290 MCG CAPS capsule Take 1 capsule (290 mcg total) by mouth daily. 30 capsule 6  . ondansetron (ZOFRAN) 8 MG tablet Take 1 tablet (8 mg total) by mouth every 8  (eight) hours as needed for nausea or vomiting. 30 tablet 3   No current facility-administered medications for this visit.     Medical Decision Making:  Established Problem, Stable/Improving (1), Review of Medication Regimen & Side Effects (2) and Review of New Medication or Change in Dosage (2)  Treatment Plan Summary:Medication management and Plan  She will continue on the medication as follows  Klonopin 0.5 mg by mouth daily at bedtime-2 month supply of the medication was given Adderall 15 mg by mouth daily- 2 month supply of the medication was given  Celexa 40 mg daily   2 month supply of the medication was given.  Follow-up in  2 months or earlier    More than 50% of the time spent in psychoeducation, counseling and coordination of care.    This note was generated in part or whole with voice recognition software. Voice regonition is usually quite accurate but there are transcription errors that can and very often do occur. I apologize for any typographical errors that were not detected and corrected.    Brandy Hale, MD  03/19/2017, 3:06 PM

## 2017-05-07 ENCOUNTER — Ambulatory Visit: Payer: Self-pay | Admitting: Physician Assistant

## 2017-05-07 ENCOUNTER — Encounter: Payer: Self-pay | Admitting: Physician Assistant

## 2017-05-07 VITALS — BP 120/78 | HR 80 | Temp 98.3°F

## 2017-05-07 DIAGNOSIS — D649 Anemia, unspecified: Secondary | ICD-10-CM

## 2017-05-07 DIAGNOSIS — T148XXA Other injury of unspecified body region, initial encounter: Secondary | ICD-10-CM

## 2017-05-07 MED ORDER — CYANOCOBALAMIN 1000 MCG/ML IJ SOLN: 1000.0000 ug | Freq: Once | INTRAMUSCULAR | Status: DC

## 2017-05-07 MED ORDER — MUPIROCIN 2 % EX OINT: 1.0000 "application " | TOPICAL_OINTMENT | Freq: Two times a day (BID) | CUTANEOUS | 0 refills | Status: DC

## 2017-05-07 NOTE — Addendum Note (Signed)
Addended by: Yvonne KendallBROWN, Katrin Grabel D on: 05/07/2017 02:34 PM   Modules accepted: Orders

## 2017-05-07 NOTE — Progress Notes (Signed)
S: c/o dog scratches on arm, happened 5 days ago, no fever/chills, area had a little yellow ?pus when she washed her arm off in the shower, thick, no liquid, no swelling  O: vitals wnl nad, skin with 3 abrasions on arms, a little red at edge of wound, no pus or drainage noted, no warm or spreading erythema, n/v intact  A: abrasion  P: bactroban

## 2017-05-19 ENCOUNTER — Ambulatory Visit (INDEPENDENT_AMBULATORY_CARE_PROVIDER_SITE_OTHER): Payer: 59 | Admitting: Psychiatry

## 2017-05-19 DIAGNOSIS — F900 Attention-deficit hyperactivity disorder, predominantly inattentive type: Secondary | ICD-10-CM | POA: Diagnosis not present

## 2017-05-19 DIAGNOSIS — F331 Major depressive disorder, recurrent, moderate: Secondary | ICD-10-CM | POA: Diagnosis not present

## 2017-05-19 MED ORDER — AMPHETAMINE-DEXTROAMPHETAMINE 15 MG PO TABS: 15.0000 mg | ORAL_TABLET | Freq: Every day | ORAL | 0 refills | Status: DC

## 2017-05-19 MED ORDER — CLONAZEPAM 0.5 MG PO TABS: 0.5000 mg | ORAL_TABLET | Freq: Every day | ORAL | 2 refills | Status: DC

## 2017-05-19 MED ORDER — CITALOPRAM HYDROBROMIDE 40 MG PO TABS: 40.0000 mg | ORAL_TABLET | Freq: Every day | ORAL | 1 refills | Status: DC

## 2017-05-19 NOTE — Progress Notes (Signed)
BH MD/PA/NP OP Progress Note  05/19/2017 3:55 PM Dana Benson  MRN:  [id number]  Subjective:  Patient is a 56 year old female who presented for follow-up appointment. She reported that she is doing well on her medications. 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 She reported that this is the good time for her. She has been compliant with her medications. She reported her medications are helping her. She is not having any acute symptoms at this time. She currently denied having any suicidal homicidal ideations or plans. She denied having any perceptual disturbances. She is sleeping well at night.            Chief Complaint:   Visit Diagnosis:     ICD-10-CM   1. Major depressive disorder, recurrent episode, moderate (HCC) F33.1   2. Attention deficit hyperactivity disorder (ADHD), predominantly inattentive type F90.0     Past Medical History:  Past Medical History:  Diagnosis Date  . ADD (attention deficit disorder with hyperactivity)   . Allergic rhinitis   . Anxiety   . Depression   . GERD (gastroesophageal reflux disease)   . History of chicken pox     Past Surgical History:  Procedure Laterality Date  . APPENDECTOMY  1979  . BLADDER SUSPENSION  1995   during hysterectomy  . CHOLECYSTECTOMY  1985  . TONSILLECTOMY  1970  . VAGINAL HYSTERECTOMY  1995   Family History:  Family History  Problem Relation Age of Onset  . Depression Mother   . Hypertension Mother   . Diabetes Mother   . Depression Sister   . Anxiety disorder Sister   . ADD / ADHD Sister   . Depression Son   . Depression Maternal Aunt   . Breast cancer Maternal Aunt 44  . Breast cancer Maternal Grandmother 60  . Breast cancer Cousin 44  . Breast cancer Maternal Aunt 73   Social History:  Social History   Social History  . Marital status: Single    Spouse name: N/A  . Number of children: 3  . Years of  education: N/A   Occupational History  .  [employer]   Social History Main Topics  . Smoking status: Current Every Day Smoker    Packs/day: 0.50    Years: 20.00    Types: Cigarettes  . Smokeless tobacco: Never Used     Comment: 2 packs a week  . Alcohol use 0.6 - 1.2 oz/week    1 - 2 Glasses of wine per week     Comment: Wine HS occasional  . Drug use: No  . Sexual activity: No   Other Topics Concern  . Not on file   Social History Narrative   Lives in [place]. Works at [employer].   Additional History:   Assessment:   Musculoskeletal: Strength & Muscle Tone: within normal limits Gait & Station: normal Patient leans: N/A  Psychiatric Specialty Exam: Medication Refill   Insomnia  Depression: sit still there but no better or no worse than last visit.   Depression         Associated symptoms include insomnia.  Associated symptoms include no suicidal ideas.  Past medical history includes anxiety.   Anxiety  Symptoms include insomnia. Patient reports no nervous/anxious behavior or suicidal ideas.      Review of Systems  Psychiatric/Behavioral: Negative for hallucinations, memory loss, substance abuse and suicidal ideas. Depression: sit still  there but no better or no worse than last visit. The patient has insomnia. The patient is not nervous/anxious.   All other systems reviewed and are negative.   There were no vitals taken for this visit.There is no height or weight on file to calculate BMI.  General Appearance: Well Groomed  Eye Contact:  Good  Speech:  Normal Rate  Volume:  Normal  Mood:  Good  Affect:  Congruent  Thought Process:  Coherent  Orientation:  Full (Time, Place, and Person)  Thought Content:  Negative  Suicidal Thoughts:  No  Homicidal Thoughts:  No  Memory:  Immediate;   Good Recent;   Good Remote;   Good  Judgement:  Good  Insight:  Good  Psychomotor Activity:  Negative  Concentration:  Good  Recall:  Good  Fund of Knowledge: Good  Language:  Good  Akathisia:  Negative  Handed:  Right unknown   AIMS (if indicated): N/A  Assets:  Communication Skills Desire for Improvement Vocational/Educational  ADL's:  Intact  Cognition: WNL  Sleep:  Good with medicine   Is the patient at risk to self?  No. Has the patient been a risk to self in the past 6 months?  No. Has the patient been a risk to self within the distant past?  No. Is the patient a risk to others?  No. Has the patient been a risk to others in the past 6 months?  No. Has the patient been a risk to others within the distant past?  No.  Current Medications: Current Outpatient Prescriptions  Medication Sig Dispense Refill  . amphetamine-dextroamphetamine (ADDERALL) 15 MG tablet Take 1 tablet by mouth daily. To be filled 04/21/17 30 tablet 0  . amphetamine-dextroamphetamine (ADDERALL) 15 MG tablet Take 1 tablet by mouth daily. To be filled 06/19/17 30 tablet 0  . cetirizine (ZYRTEC) 10 MG tablet Take 10 mg by mouth daily.    . citalopram (CELEXA) 40 MG tablet Take 1 tablet (40 mg total) by mouth daily. 30 tablet 1  . clonazePAM (KLONOPIN) 0.5 MG tablet Take 1 tablet (0.5 mg total) by mouth at bedtime. 30 tablet 2  . cyanocobalamin (,VITAMIN B-12,) 1000 MCG/ML injection Inject 1 mL (1,000 mcg total) into the muscle every 30 (thirty) days. 1 mL 11  . cyanocobalamin (,VITAMIN B-12,) 1000 MCG/ML injection Inject 1 mL (1,000 mcg total) into the muscle once. 1 mL 0  . fluticasone (FLONASE) 50 MCG/ACT nasal spray Place 2 sprays into both nostrils daily. 16 g 11  . Linaclotide (LINZESS) 290 MCG CAPS capsule Take 1 capsule (290 mcg total) by mouth daily. 30 capsule 6  . mupirocin ointment (BACTROBAN) 2 % Apply 1 application topically 2 (two) times daily. 22 g 0  . ondansetron (ZOFRAN) 8 MG tablet Take 1 tablet (8 mg total) by mouth every 8 (eight) hours as needed for nausea or vomiting. 30 tablet 3   Current Facility-Administered Medications  Medication Dose Route Frequency Provider  Last Rate Last Dose  . cyanocobalamin ((VITAMIN B-12)) injection 1,000 mcg  1,000 mcg Intramuscular Once Faythe Ghee, PA-C        Medical Decision Making:  Established Problem, Stable/Improving (1), Review of Medication Regimen & Side Effects (2) and Review of New Medication or Change in Dosage (2)  Treatment Plan Summary:Medication management and Plan  She will continue on the medication as follows  Klonopin 0.5 mg by mouth daily at bedtime-2 month supply of the medication was given Adderall 15 mg by mouth  daily- 2 month supply of the medication was given Celexa 40 mg daily   2 month supply of the medication was given.  Follow-up in  2 months or earlier    More than 50% of the time spent in psychoeducation, counseling and coordination of care.    This note was generated in part or whole with voice recognition software. Voice regonition is usually quite accurate but there are transcription errors that can and very often do occur. I apologize for any typographical errors that were not detected and corrected.    Brandy Hale, MD  05/19/2017, 3:55 PM

## 2017-07-14 ENCOUNTER — Ambulatory Visit: Payer: 59 | Admitting: Psychiatry

## 2017-07-30 ENCOUNTER — Ambulatory Visit (INDEPENDENT_AMBULATORY_CARE_PROVIDER_SITE_OTHER): Payer: 59 | Admitting: Psychiatry

## 2017-07-30 ENCOUNTER — Encounter: Payer: Self-pay | Admitting: Psychiatry

## 2017-07-30 VITALS — BP 138/74 | HR 61 | Temp 98.5°F | Wt 153.0 lb

## 2017-07-30 DIAGNOSIS — F900 Attention-deficit hyperactivity disorder, predominantly inattentive type: Secondary | ICD-10-CM

## 2017-07-30 DIAGNOSIS — F331 Major depressive disorder, recurrent, moderate: Secondary | ICD-10-CM | POA: Diagnosis not present

## 2017-07-30 DIAGNOSIS — F410 Panic disorder [episodic paroxysmal anxiety] without agoraphobia: Secondary | ICD-10-CM | POA: Diagnosis not present

## 2017-07-30 MED ORDER — AMPHETAMINE-DEXTROAMPHETAMINE 15 MG PO TABS: 15.0000 mg | ORAL_TABLET | Freq: Every day | ORAL | 0 refills | Status: DC

## 2017-07-30 MED ORDER — CLONAZEPAM 0.5 MG PO TABS: 0.5000 mg | ORAL_TABLET | Freq: Every day | ORAL | 2 refills | Status: DC

## 2017-07-30 MED ORDER — CITALOPRAM HYDROBROMIDE 40 MG PO TABS: 40.0000 mg | ORAL_TABLET | Freq: Every day | ORAL | 1 refills | Status: DC

## 2017-07-30 NOTE — Progress Notes (Signed)
BH MD/PA/NP OP Progress Note  07/30/2017 3:14 PM Ellin MayhewLinda M Infinger  MRN:  [id number]  Chief Complaint: " I am ok."  Chief Complaint    Follow-up; Medication Refill     HPI: Dana Benson is a 56 year old Caucasian female who is divorced, employed, who lives in [place], who presented to the clinic today for a follow-up appointment.  Marjie usually follows up with Dr. Garnetta BuddyFaheem.  This is Tosca's first appointment with Clinical research associatewriter.  Dana Benson seemed dysphoric today.  When asked about that,  Dana Benson reported that October is always a difficult month for her.  Dana Benson talked about losing her 56 year old son who was at [employer] and passed away due to possible consequences of drug intoxication after he was at a party.  Dana Benson became very tearful when she talked about this.  Dana Benson reported that her birthday,  her son's birthday as well as the anniversary of him passing away, all falls in October and hence October is a very difficult month for her.  Dana Benson reports that she did get psychotherapy for the same in the past.  However she did not have a good response to it.  She reported that it is always difficult dealing with the loss of a child.   Dana Benson currently reports that the Celexa is working okay.  She denies any side effects.  She reports that she tried several other medications in the past prior to Celexa.  She reports that she wants to stay on it.  She is also on Klonopin 0.5 mg which she takes at bedtime.  She reports that she started taking that for panic symptoms.  And she has been taking it since the past 2 years or so.  Discussed the risk of being on benzodiazepines for long-term.  Dana Benson reports that she will try to cut her Klonopin into half and see how she responds to it.  Dana Benson also has ADD symptoms.  She is currently on Adderall 15 mg which she takes daily in the morning.  She denies any side effects to the Adderall.  She wants to stay on the same dose.  Dana Benson does have slightly elevated blood pressure today.   Discussed the effect of stimulants on blood pressure.  Dana Benson reports that she will monitor and follow-up with her primary medical doctor.  Denies any substance abuse issues, she denies overusing her Adderall or her Klonopin.  Reviewed and see controlled substance database.   Visit Diagnosis:    ICD-10-CM   1. Major depressive disorder, recurrent episode, moderate (HCC) F33.1 citalopram (CELEXA) 40 MG tablet  2. Attention deficit hyperactivity disorder (ADHD), predominantly inattentive type F90.0 amphetamine-dextroamphetamine (ADDERALL) 15 MG tablet    amphetamine-dextroamphetamine (ADDERALL) 15 MG tablet  3. Panic attack F41.0 clonazePAM (KLONOPIN) 0.5 MG tablet    Past Psychiatric History: Dana Benson reports a history of depression, ADD, anxiety since the past several years. She denies hospital admission for mental health problems.  She has been tried on several different medications in the past including Effexor, Paxil, Wellbutrin, Prozac, Lexapro, Cymbalta. She also reports being on trazodone which gave her nightmares.  She reports she also got grief counseling and also psychotherapy with a therapist in the past.   Past Medical History:  Past Medical History:  Diagnosis Date  . ADD (attention deficit disorder with hyperactivity)   . Allergic rhinitis   . Anxiety   . Depression   . GERD (gastroesophageal reflux disease)   . History of chicken pox     Past Surgical History:  Procedure  Laterality Date  . APPENDECTOMY  1979  . BLADDER SUSPENSION  1995   during hysterectomy  . CHOLECYSTECTOMY  1985  . TONSILLECTOMY  1970  . VAGINAL HYSTERECTOMY  1995    Family Psychiatric History: Son - depression, sister -ADD, sister- anxiety disorder, depression, mother- depression, maternal aunt- depression.  Family History:  Family History  Problem Relation Age of Onset  . Depression Mother   . Hypertension Mother   . Diabetes Mother   . Depression Sister   . Anxiety disorder Sister   .  ADD / ADHD Sister   . Depression Son   . Depression Maternal Aunt   . Breast cancer Maternal Aunt 44  . Breast cancer Maternal Grandmother 60  . Breast cancer Cousin 44  . Breast cancer Maternal Aunt 45    Social History: Rex is currently divorced from her husband.  She is employed with [employer].  She has 2 sons who are living.  Her youngest son passed away. Social History   Social History  . Marital status: Single    Spouse name: N/A  . Number of children: 3  . Years of education: N/A   Occupational History  .  [employer]   Social History Main Topics  . Smoking status: Current Every Day Smoker    Packs/day: 0.50    Years: 20.00    Types: Cigarettes  . Smokeless tobacco: Never Used     Comment: 2 packs a week  . Alcohol use 0.6 - 1.2 oz/week    1 - 2 Glasses of wine per week     Comment: Wine HS occasional  . Drug use: No  . Sexual activity: No   Other Topics Concern  . None   Social History Narrative   Lives in [place]. Works at [employer].    Allergies:  Allergies  Allergen Reactions  . Prozac [Fluoxetine Hcl] Other (See Comments)    GI ISSUES  . Trazodone And Nefazodone     nightmares  . Wellbutrin [Bupropion] Other (See Comments)    Dizziness and ? Hearing loss  . Prednisone Anxiety    Metabolic Disorder Labs: No results found for: HGBA1C, MPG No results found for: PROLACTIN Lab Results  Component Value Date   CHOL 231 (H) 06/28/2015   TRIG 85.0 06/28/2015   HDL 68.50 06/28/2015   CHOLHDL 3 06/28/2015   VLDL 17.0 06/28/2015   LDLCALC 146 (H) 06/28/2015   LDLCALC 118 (H) 06/03/2014   Lab Results  Component Value Date   TSH 1.12 06/28/2015   TSH 0.93 11/29/2014    Therapeutic Level Labs: No results found for: LITHIUM No results found for: VALPROATE No components found for:  CBMZ  Current Medications: Current Outpatient Prescriptions  Medication Sig Dispense Refill  . amphetamine-dextroamphetamine (ADDERALL) 15 MG tablet Take 1 tablet by  mouth daily. TO BE FILLED ON OR AFTER 08/30/2017 30 tablet 0  . amphetamine-dextroamphetamine (ADDERALL) 15 MG tablet Take 1 tablet by mouth daily. 30 tablet 0  . cetirizine (ZYRTEC) 10 MG tablet Take 10 mg by mouth daily.    . citalopram (CELEXA) 40 MG tablet Take 1 tablet (40 mg total) by mouth daily. 30 tablet 1  . clonazePAM (KLONOPIN) 0.5 MG tablet Take 1 tablet (0.5 mg total) by mouth at bedtime. 30 tablet 2  . cyanocobalamin (,VITAMIN B-12,) 1000 MCG/ML injection Inject 1 mL (1,000 mcg total) into the muscle every 30 (thirty) days. 1 mL 11  . cyanocobalamin (,VITAMIN B-12,) 1000 MCG/ML injection Inject 1  mL (1,000 mcg total) into the muscle once. 1 mL 0  . fluticasone (FLONASE) 50 MCG/ACT nasal spray Place 2 sprays into both nostrils daily. 16 g 11  . Linaclotide (LINZESS) 290 MCG CAPS capsule Take 1 capsule (290 mcg total) by mouth daily. 30 capsule 6  . ondansetron (ZOFRAN) 8 MG tablet Take 1 tablet (8 mg total) by mouth every 8 (eight) hours as needed for nausea or vomiting. 30 tablet 3   Current Facility-Administered Medications  Medication Dose Route Frequency Provider Last Rate Last Dose  . cyanocobalamin ((VITAMIN B-12)) injection 1,000 mcg  1,000 mcg Intramuscular Once Sherrie Mustache Roselyn Bering, PA-C         Musculoskeletal: Strength & Muscle Tone: within normal limits Gait & Station: normal Patient leans: N/A  Psychiatric Specialty Exam: Review of Systems  Psychiatric/Behavioral: Positive for depression. The patient is nervous/anxious.   All other systems reviewed and are negative.   Blood pressure 138/74, pulse 61, temperature 98.5 F (36.9 C), temperature source Oral, weight 153 lb (69.4 kg).Body mass index is 25.46 kg/m.  General Appearance: Casual  Eye Contact:  Fair  Speech:  Normal Rate  Volume:  Normal  Mood:  Dysphoric  Affect:  Tearful  Thought Process:  Goal Directed and Descriptions of Associations: Intact  Orientation:  Full (Time, Place, and Person)  Thought  Content: Logical   Suicidal Thoughts:  No  Homicidal Thoughts:  No  Memory:  Immediate;   Fair Recent;   Fair Remote;   Fair  Judgement:  Fair  Insight:  Fair  Psychomotor Activity:  Normal  Concentration:  Concentration: Fair and Attention Span: Fair  Recall:  [employer] of Knowledge: Fair  Language: Fair  Akathisia:  No  Handed:  Right  AIMS (if indicated):NA  Assets:  Communication Skills Desire for Improvement Housing Physical Health Social Support Talents/Skills Transportation Others:  access to health care  ADL's:  Intact  Cognition: WNL  Sleep:  Fair   Screenings: PHQ2-9     Office Visit from 03/13/2016 in [facility] Office Visit from 08/20/2012 in [facility]  PHQ-2 Total Score  0  0       Assessment and Plan: Caylah is an established patient of this clinic.  Bre used to follow up with Dr.Faheem in the past.  Thresia is here for a follow-up appointment.  Katonya reports she is doing well on her current medications for depression, anxiety and ADD.  Charlissa however struggles with the thoughts of her son who passed away.  Discussed psychotherapy referral with patient . Priyal reports that she would consider it.  Ioma denies any suicidality.  Sadako continues to be a good candidate for outpatient treatment.  Plan as noted below: For depression Celexa 40 mg p.o. Daily  For anxiety Celexa 40 mg p.o. daily. Klonopin 0.5 mg p.o. nightly Reviewed [place] controlled substance database. Discussed risk of benzodiazepines with patient.  For ADHD Continue Adderall 15 mg p.o. daily Reviewed [place] controlled substance database Gave  2 scripts with date specified.  Discussed her blood pressure.She will monitor closely. Discussed the effect of stimulants on her BP and cardiac health.   Follow up in 2 months .  More than 50 % of the time was spent for psychoeducation and supportive psychotherapy and care coordination.  This note was generated  in part or whole with voice recognition software. Voice recognition is usually quite accurate but there are transcription errors that can and very often do occur. I  apologize for any typographical errors that were not detected and corrected.      Jomarie Longs, MD 07/30/2017, 3:14 PM

## 2017-09-15 ENCOUNTER — Ambulatory Visit: Payer: 59 | Admitting: Psychiatry

## 2017-10-01 ENCOUNTER — Ambulatory Visit (INDEPENDENT_AMBULATORY_CARE_PROVIDER_SITE_OTHER): Payer: No Typology Code available for payment source | Admitting: Psychiatry

## 2017-10-01 ENCOUNTER — Other Ambulatory Visit: Payer: Self-pay

## 2017-10-01 ENCOUNTER — Encounter: Payer: Self-pay | Admitting: Psychiatry

## 2017-10-01 DIAGNOSIS — F331 Major depressive disorder, recurrent, moderate: Secondary | ICD-10-CM

## 2017-10-01 DIAGNOSIS — F410 Panic disorder [episodic paroxysmal anxiety] without agoraphobia: Secondary | ICD-10-CM | POA: Diagnosis not present

## 2017-10-01 DIAGNOSIS — F900 Attention-deficit hyperactivity disorder, predominantly inattentive type: Secondary | ICD-10-CM

## 2017-10-01 MED ORDER — AMPHETAMINE-DEXTROAMPHETAMINE 15 MG PO TABS: 15.0000 mg | ORAL_TABLET | Freq: Every day | ORAL | 0 refills | Status: DC

## 2017-10-01 MED ORDER — CITALOPRAM HYDROBROMIDE 40 MG PO TABS: 40.0000 mg | ORAL_TABLET | Freq: Every day | ORAL | 2 refills | Status: DC

## 2017-10-01 MED ORDER — CLONAZEPAM 0.5 MG PO TABS: 0.5000 mg | ORAL_TABLET | Freq: Every evening | ORAL | 2 refills | Status: DC | PRN

## 2017-10-01 NOTE — Progress Notes (Signed)
BH MD OP Progress Note  10/02/2017 8:37 AM Dana Benson  MRN:  [id number]  Chief Complaint: ' I am ok."   Chief Complaint    Follow-up; Medication Refill     HPI: Dana Benson is a 57 year old Caucasian female who is divorced, employed, lives in [place], presented to the clinic today for a follow-up visit.  Dana Benson struggles with depression, anxiety as well as ADHD symptoms.  She reports she has been taking her medications on a regular basis and is currently doing okay.  She denies any side effects to her current medications.  Dana Benson however reports that it is always a struggle to get through the holidays.  She reports that after losing her 34 year old son who passed away due to possible drug use, the days starting the month of October gets harder for her.  She reports that a lot of birthdays , hers, her son's , anniversary of him passing away as well as the holidays like Thanksgiving, Christmas and New Year's are kind of a struggle to get through and they all fall in October or after. She however was surrounded by family during the holidays and she enjoyed their company.  She reports that she does not want to try to reduce the Klonopin yet.  She reports since the winter months are harder for her ,she does not want to start the process right now.  She tried it in the past and it did not work.  She would like to wait until it gets brighter outside and warmer to see if she can try to wean her Klonopin down.  She denies any new concerns at this time.  She denies any substance abuse problems.  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 Visit Diagnosis:    ICD-10-CM   1. Major depressive disorder, recurrent episode, moderate (HCC) F33.1 citalopram (CELEXA) 40 MG tablet  2. Panic attack F41.0 clonazePAM (KLONOPIN) 0.5 MG tablet  3. Attention deficit hyperactivity disorder (ADHD), predominantly inattentive type F90.0  amphetamine-dextroamphetamine (ADDERALL) 15 MG tablet    DISCONTINUED: amphetamine-dextroamphetamine (ADDERALL) 15 MG tablet    DISCONTINUED: amphetamine-dextroamphetamine (ADDERALL) 15 MG tablet    DISCONTINUED: amphetamine-dextroamphetamine (ADDERALL) 15 MG tablet    Past Psychiatric History: Reports a history of depression, ADD, anxiety, since the past several years.  She denies hospital admission for mental health problems.  She has been tried on several different medications in the past including Effexor, Paxil, Wellbutrin, Prozac, Lexapro, Cymbalta.  She also reports being on trazodone which gave her nightmares.  She reports she also got grief counseling and also psychotherapy with the therapist in the past.  Past Medical History:  Past Medical History:  Diagnosis Date  . ADD (attention deficit disorder with hyperactivity)   . Allergic rhinitis   . Anxiety   . Depression   . GERD (gastroesophageal reflux disease)   . History of chicken pox     Past Surgical History:  Procedure Laterality Date  . APPENDECTOMY  1979  . BLADDER SUSPENSION  1995   during hysterectomy  . CHOLECYSTECTOMY  1985  . TONSILLECTOMY  1970  . VAGINAL HYSTERECTOMY  1995    Family Psychiatric History:Son -Depression, sister-ADD, sister-anxiety disorder, depression, mother-depression, maternal aunt-depression  Family History:  Family History  Problem Relation Age of Onset  . Depression Mother   . Hypertension Mother   . Diabetes Mother   . Depression Sister   . Anxiety disorder Sister   .  ADD / ADHD Sister   . Depression Son   . Depression Maternal Aunt   . Breast cancer Maternal Aunt 44  . Breast cancer Maternal Grandmother 60  . Breast cancer Cousin 44  . Breast cancer Maternal Aunt [id number]    Social History: Dana Benson is currently divorced from her husband.  She is employed with [employer].  She has 2 sons who are living.  Her youngest son passed away. Social History   Socioeconomic History  .  Marital status: Single    Spouse name: None  . Number of children: 3  . Years of education: None  . Highest education level: None  Social Needs  . Financial resource strain: None  . Food insecurity - worry: None  . Food insecurity - inability: None  . Transportation needs - medical: None  . Transportation needs - non-medical: None  Occupational History    Employer: [employer]  Tobacco Use  . Smoking status: Current Every Day Smoker    Packs/day: 0.50    Years: 20.00    Pack years: 10.00    Types: Cigarettes  . Smokeless tobacco: Never Used  . Tobacco comment: 2 packs a week  Substance and Sexual Activity  . Alcohol use: Yes    Alcohol/week: 0.6 - 1.2 oz    Types: 1 - 2 Glasses of wine per week    Comment: Wine HS occasional  . Drug use: No  . Sexual activity: No  Other Topics Concern  . None  Social History Narrative   Lives in [place]. Works at [employer].    Allergies:  Allergies  Allergen Reactions  . Prozac [Fluoxetine Hcl] Other (See Comments)    GI ISSUES  . Trazodone And Nefazodone     nightmares  . Wellbutrin [Bupropion] Other (See Comments)    Dizziness and ? Hearing loss  . Prednisone Anxiety    Metabolic Disorder Labs: No results found for: HGBA1C, MPG No results found for: PROLACTIN Lab Results  Component Value Date   CHOL 231 (H) 06/28/2015   TRIG 85.0 06/28/2015   HDL 68.50 06/28/2015   CHOLHDL 3 06/28/2015   VLDL 17.0 06/28/2015   LDLCALC 146 (H) 06/28/2015   LDLCALC 118 (H) 06/03/2014   Lab Results  Component Value Date   TSH 1.12 06/28/2015   TSH 0.93 11/29/2014    Therapeutic Level Labs: No results found for: LITHIUM No results found for: VALPROATE No components found for:  CBMZ  Current Medications: Current Outpatient Medications  Medication Sig Dispense Refill  . amphetamine-dextroamphetamine (ADDERALL) 15 MG tablet Take 1 tablet by mouth daily. 30 tablet 0  . cetirizine (ZYRTEC) 10 MG tablet Take 10 mg by mouth daily.    .  citalopram (CELEXA) 40 MG tablet Take 1 tablet (40 mg total) by mouth daily. 30 tablet 2  . clonazePAM (KLONOPIN) 0.5 MG tablet Take 1 tablet (0.5 mg total) by mouth at bedtime as needed for anxiety. 30 tablet 2  . cyanocobalamin (,VITAMIN B-12,) 1000 MCG/ML injection Inject 1 mL (1,000 mcg total) into the muscle every 30 (thirty) days. 1 mL 11  . cyanocobalamin (,VITAMIN B-12,) 1000 MCG/ML injection Inject 1 mL (1,000 mcg total) into the muscle once. 1 mL 0  . fluticasone (FLONASE) 50 MCG/ACT nasal spray Place 2 sprays into both nostrils daily. 16 g 11  . Linaclotide (LINZESS) 290 MCG CAPS capsule Take 1 capsule (290 mcg total) by mouth daily. 30 capsule 6  . ondansetron (ZOFRAN) 8 MG tablet Take 1 tablet (  8 mg total) by mouth every 8 (eight) hours as needed for nausea or vomiting. 30 tablet 3   Current Facility-Administered Medications  Medication Dose Route Frequency Provider Last Rate Last Dose  . cyanocobalamin ((VITAMIN B-12)) injection 1,000 mcg  1,000 mcg Intramuscular Once Sherrie Mustache Roselyn Bering, PA-C         Musculoskeletal: Strength & Muscle Tone: within normal limits Gait & Station: normal Patient leans: N/A  Psychiatric Specialty Exam: Review of Systems  Psychiatric/Behavioral: Positive for depression.  All other systems reviewed and are negative.   Blood pressure 132/83, pulse 72, temperature 98.1 F (36.7 C), temperature source Oral, height 5\' 4"  (1.626 m), weight 156 lb (70.8 kg).Body mass index is 26.78 kg/m.  General Appearance: Casual  Eye Contact:  Fair  Speech:  Normal Rate  Volume:  Normal  Mood:  Dysphoric  Affect:  Congruent  Thought Process:  Goal Directed and Descriptions of Associations: Intact  Orientation:  Full (Time, Place, and Person)  Thought Content: Logical   Suicidal Thoughts:  No  Homicidal Thoughts:  No  Memory:  Immediate;   Fair Recent;   Fair Remote;   Fair  Judgement:  Fair  Insight:  Fair  Psychomotor Activity:  Normal  Concentration:   Concentration: Fair and Attention Span: Fair  Recall:  [employer] of Knowledge: Fair  Language: Fair  Akathisia:  No  Handed:  Right  AIMS (if indicated): NA  Assets: housing, social support, talents  ADL's:  Intact  Cognition: WNL  Sleep:  Fair   Screenings: PHQ2-9     Office Visit from 03/13/2016 in [facility] Office Visit from 08/20/2012 in [facility]  PHQ-2 Total Score  0  0       Assessment and Plan: Pattijo is an established patient of this clinic.  Ernestyne is here for a follow-up appointment and is currently doing okay on her current medications.  She continues to struggle with the thoughts of her son who passed away and holidays can get extra difficult for her.  She however has good social support.  And she is doing well at her work at this time.  She denies any suicidality.  She continues to be a good candidate for outpatient treatment.  Plan  For depression Celexa 40 mg p.o. daily  For anxiety  Celexa 40 mg p.o. daily Continue Klonopin 0.5 mg p.o., but change to nightly as needed.  Discussed risk of being on benzodiazepines.  Reviewed [place] controlled substance database.  Will try  to discuss weaning her off of the Klonopin again next visit.  ADHD Continue Adderall 15 mg p.o. daily.  Provided 3 scripts with date specified. Reviewed [place] controlled substance database.  Discussed referral for psychotherapy, she is not ready.  Follow-up in 3 months or sooner if needed  More than 50 % of the time was spent for psychoeducation and supportive psychotherapy and care coordination.  This note was generated in part or whole with voice recognition software. Voice recognition is usually quite accurate but there are transcription errors that can and very often do occur. I apologize for any typographical errors that were not detected and corrected.       Jomarie Longs, MD 10/02/2017, 8:37 AM

## 2017-10-02 ENCOUNTER — Encounter: Payer: Self-pay | Admitting: Psychiatry

## 2017-12-16 ENCOUNTER — Other Ambulatory Visit: Payer: Self-pay

## 2017-12-16 ENCOUNTER — Ambulatory Visit (INDEPENDENT_AMBULATORY_CARE_PROVIDER_SITE_OTHER): Payer: No Typology Code available for payment source | Admitting: Psychiatry

## 2017-12-16 ENCOUNTER — Encounter: Payer: Self-pay | Admitting: Psychiatry

## 2017-12-16 VITALS — BP 148/79 | HR 66 | Temp 98.6°F | Wt 158.6 lb

## 2017-12-16 DIAGNOSIS — F410 Panic disorder [episodic paroxysmal anxiety] without agoraphobia: Secondary | ICD-10-CM | POA: Diagnosis not present

## 2017-12-16 DIAGNOSIS — F900 Attention-deficit hyperactivity disorder, predominantly inattentive type: Secondary | ICD-10-CM | POA: Diagnosis not present

## 2017-12-16 DIAGNOSIS — F331 Major depressive disorder, recurrent, moderate: Secondary | ICD-10-CM | POA: Diagnosis not present

## 2017-12-16 DIAGNOSIS — F411 Generalized anxiety disorder: Secondary | ICD-10-CM | POA: Diagnosis not present

## 2017-12-16 MED ORDER — AMPHETAMINE-DEXTROAMPHETAMINE 20 MG PO TABS: 20.0000 mg | ORAL_TABLET | Freq: Every day | ORAL | 0 refills | Status: DC

## 2017-12-16 MED ORDER — CITALOPRAM HYDROBROMIDE 40 MG PO TABS: 40.0000 mg | ORAL_TABLET | Freq: Every day | ORAL | 2 refills | Status: DC

## 2017-12-16 MED ORDER — CLONAZEPAM 0.5 MG PO TABS: 0.5000 mg | ORAL_TABLET | Freq: Every evening | ORAL | 2 refills | Status: DC | PRN

## 2017-12-16 MED ORDER — TRAZODONE HCL 50 MG PO TABS: 25.0000 mg | ORAL_TABLET | Freq: Every evening | ORAL | 1 refills | Status: DC | PRN

## 2017-12-16 NOTE — Patient Instructions (Signed)
Trazodone tablets What is this medicine? TRAZODONE (TRAZ oh done) is used to treat depression. This medicine may be used for other purposes; ask your health care provider or pharmacist if you have questions. COMMON BRAND NAME(S): Desyrel What should I tell my health care provider before I take this medicine? They need to know if you have any of these conditions: -attempted suicide or thinking about it -bipolar disorder -bleeding problems -glaucoma -heart disease, or previous heart attack -irregular heart beat -kidney or liver disease -low levels of sodium in the blood -an unusual or allergic reaction to trazodone, other medicines, foods, dyes or preservatives -pregnant or trying to get pregnant -breast-feeding How should I use this medicine? Take this medicine by mouth with a glass of water. Follow the directions on the prescription label. Take this medicine shortly after a meal or a light snack. Take your medicine at regular intervals. Do not take your medicine more often than directed. Do not stop taking this medicine suddenly except upon the advice of your doctor. Stopping this medicine too quickly may cause serious side effects or your condition may worsen. A special MedGuide will be given to you by the pharmacist with each prescription and refill. Be sure to read this information carefully each time. Talk to your pediatrician regarding the use of this medicine in children. Special care may be needed. Overdosage: If you think you have taken too much of this medicine contact a poison control center or emergency room at once. NOTE: This medicine is only for you. Do not share this medicine with others. What if I miss a dose? If you miss a dose, take it as soon as you can. If it is almost time for your next dose, take only that dose. Do not take double or extra doses. What may interact with this medicine? Do not take this medicine with any of the following medications: -certain medicines  for fungal infections like fluconazole, itraconazole, ketoconazole, posaconazole, voriconazole -cisapride -dofetilide -dronedarone -linezolid -MAOIs like Carbex, Eldepryl, Marplan, Nardil, and Parnate -mesoridazine -methylene blue (injected into a vein) -pimozide -saquinavir -thioridazine -ziprasidone This medicine may also interact with the following medications: -alcohol -antiviral medicines for HIV or AIDS -aspirin and aspirin-like medicines -barbiturates like phenobarbital -certain medicines for blood pressure, heart disease, irregular heart beat -certain medicines for depression, anxiety, or psychotic disturbances -certain medicines for migraine headache like almotriptan, eletriptan, frovatriptan, naratriptan, rizatriptan, sumatriptan, zolmitriptan -certain medicines for seizures like carbamazepine and phenytoin -certain medicines for sleep -certain medicines that treat or prevent blood clots like dalteparin, enoxaparin, warfarin -digoxin -fentanyl -lithium -NSAIDS, medicines for pain and inflammation, like ibuprofen or naproxen -other medicines that prolong the QT interval (cause an abnormal heart rhythm) -rasagiline -supplements like St. John's wort, kava kava, valerian -tramadol -tryptophan This list may not describe all possible interactions. Give your health care provider a list of all the medicines, herbs, non-prescription drugs, or dietary supplements you use. Also tell them if you smoke, drink alcohol, or use illegal drugs. Some items may interact with your medicine. What should I watch for while using this medicine? Tell your doctor if your symptoms do not get better or if they get worse. Visit your doctor or health care professional for regular checks on your progress. Because it may take several weeks to see the full effects of this medicine, it is important to continue your treatment as prescribed by your doctor. Patients and their families should watch out for new  or worsening thoughts of suicide or depression. Also   watch out for sudden changes in feelings such as feeling anxious, agitated, panicky, irritable, hostile, aggressive, impulsive, severely restless, overly excited and hyperactive, or not being able to sleep. If this happens, especially at the beginning of treatment or after a change in dose, call your health care professional. You may get drowsy or dizzy. Do not drive, use machinery, or do anything that needs mental alertness until you know how this medicine affects you. Do not stand or sit up quickly, especially if you are an older patient. This reduces the risk of dizzy or fainting spells. Alcohol may interfere with the effect of this medicine. Avoid alcoholic drinks. This medicine may cause dry eyes and blurred vision. If you wear contact lenses you may feel some discomfort. Lubricating drops may help. See your eye doctor if the problem does not go away or is severe. Your mouth may get dry. Chewing sugarless gum, sucking hard candy and drinking plenty of water may help. Contact your doctor if the problem does not go away or is severe. What side effects may I notice from receiving this medicine? Side effects that you should report to your doctor or health care professional as soon as possible: -allergic reactions like skin rash, itching or hives, swelling of the face, lips, or tongue -elevated mood, decreased need for sleep, racing thoughts, impulsive behavior -confusion -fast, irregular heartbeat -feeling faint or lightheaded, falls -feeling agitated, angry, or irritable -loss of balance or coordination -painful or prolonged erections -restlessness, pacing, inability to keep still -suicidal thoughts or other mood changes -tremors -trouble sleeping -seizures -unusual bleeding or bruising Side effects that usually do not require medical attention (report to your doctor or health care professional if they continue or are bothersome): -change in  sex drive or performance -change in appetite or weight -constipation -headache -muscle aches or pains -nausea This list may not describe all possible side effects. Call your doctor for medical advice about side effects. You may report side effects to FDA at 1-800-FDA-1088. Where should I keep my medicine? Keep out of the reach of children. Store at room temperature between 15 and 30 degrees C (59 to 86 degrees F). Protect from light. Keep container tightly closed. Throw away any unused medicine after the expiration date. NOTE: This sheet is a summary. It may not cover all possible information. If you have questions about this medicine, talk to your doctor, pharmacist, or health care provider.  2018 Elsevier/Gold Standard (2016-02-15 16:57:05)  

## 2017-12-16 NOTE — Progress Notes (Signed)
BH MD  OP Progress Note  12/16/2017 5:20 PM Dana GARRETSON  MRN:  [id number]  Chief Complaint: ' I am ok."  Chief Complaint    Follow-up; Medication Refill     HPI: Dana Benson is a 57 year old Caucasian female who is divorced, employed, lives in [place], presented to the clinic today for a follow-up visit.  Dana Benson reports she has been taking her Celexa as well as her ADHD medication as prescribed.  She reports she continues to have some anxiety symptoms on and off especially when she goes to bed.  She reports she has been taking the Klonopin since the past several years to address her nighttime anxiety.  She reports sleep continues to be restless.  She reports she did try a higher dose of trazodone in the past but that gave her nightmares.  She reports she is willing to try a smaller dose of trazodone to see if it will help her.  Discussed with patient to call back to writer if she has side effects or if the trazodone low dose is not helpful.  Patient agrees with plan.  [name] reports she continues to have some attention problems at work.  She reports she has been making some careless mistakes at work.  Discussed increasing her ADHD medication.  She agrees with plan.  She denies any suicidality or perceptual disturbances at this time.  She reports she continues to stay away from drugs and alcohol.   Visit Diagnosis:    ICD-10-CM   1. MDD (major depressive disorder), recurrent episode, moderate (HCC) F33.1 amphetamine-dextroamphetamine (ADDERALL) 20 MG tablet    citalopram (CELEXA) 40 MG tablet    traZODone (DESYREL) 50 MG tablet  2. Attention deficit hyperactivity disorder (ADHD), predominantly inattentive type F90.0 citalopram (CELEXA) 40 MG tablet    traZODone (DESYREL) 50 MG tablet  3. GAD (generalized anxiety disorder) F41.1   4. Panic attack F41.0 clonazePAM (KLONOPIN) 0.5 MG tablet    Past Psychiatric History: Reports a history of depression, ADD, anxiety, since the past several  years.  She denies hospital admission for mental health problems.  She has been tried on several different medications in the past including Effexor, Paxil, Wellbutrin, Prozac, Lexapro, Cymbalta.  She also reports being on trazodone which gave her nightmares.  She reports she also got grief counseling and also psychotherapy with the therapist in the past.  Past Medical History:  Past Medical History:  Diagnosis Date  . ADD (attention deficit disorder with hyperactivity)   . Allergic rhinitis   . Anxiety   . Depression   . GERD (gastroesophageal reflux disease)   . History of chicken pox     Past Surgical History:  Procedure Laterality Date  . APPENDECTOMY  1979  . BLADDER SUSPENSION  1995   during hysterectomy  . CHOLECYSTECTOMY  1985  . TONSILLECTOMY  1970  . VAGINAL HYSTERECTOMY  1995    Family Psychiatric History: son -depression, sister-ADD, sister-anxiety disorder, depression, mother-depression, maternal aunt-depression Family History:  Family History  Problem Relation Age of Onset  . Depression Mother   . Hypertension Mother   . Diabetes Mother   . Depression Sister   . Anxiety disorder Sister   . ADD / ADHD Sister   . Depression Son   . Depression Maternal Aunt   . Breast cancer Maternal Aunt 44  . Breast cancer Maternal Grandmother 60  . Breast cancer Cousin 44  . Breast cancer Maternal Aunt 14    Social History: [name] is currently divorced  from her husband.  She is employed with [employer].  She has 2 sons who are living.  Her younger son passed away. Social History   Socioeconomic History  . Marital status: Single    Spouse name: None  . Number of children: 3  . Years of education: None  . Highest education level: None  Social Needs  . Financial resource strain: None  . Food insecurity - worry: None  . Food insecurity - inability: None  . Transportation needs - medical: None  . Transportation needs - non-medical: None  Occupational History     Employer: [employer]  Tobacco Use  . Smoking status: Current Every Day Smoker    Packs/day: 0.50    Years: 20.00    Pack years: 10.00    Types: Cigarettes  . Smokeless tobacco: Never Used  . Tobacco comment: 2 packs a week  Substance and Sexual Activity  . Alcohol use: Yes    Alcohol/week: 0.6 - 1.2 oz    Types: 1 - 2 Glasses of wine per week    Comment: Wine HS occasional  . Drug use: No  . Sexual activity: No  Other Topics Concern  . None  Social History Narrative   [place]ives in [place]. Works at [employer].    Allergies:  Allergies  Allergen Reactions  . Prozac [Fluoxetine Hcl] Other (See Comments)    GI ISSUES  . Trazodone And Nefazodone     nightmares  . Wellbutrin [Bupropion] Other (See Comments)    Dizziness and ? Hearing loss  . Prednisone Anxiety    Metabolic Disorder Labs: No results found for: HGBA1C, MPG No results found for: PROLACTIN Lab Results  Component Value Date   CHOL 231 (H) 06/28/2015   TRIG 85.0 06/28/2015   HDL 68.50 06/28/2015   CHOLHDL 3 06/28/2015   VLDL 17.0 06/28/2015   LDLCALC 146 (H) 06/28/2015   LDLCALC 118 (H) 06/03/2014   Lab Results  Component Value Date   TSH 1.12 06/28/2015   TSH 0.93 11/29/2014    Therapeutic Level Labs: No results found for: LITHIUM No results found for: VALPROATE No components found for:  CBMZ  Current Medications: Current Outpatient Medications  Medication Sig Dispense Refill  . amphetamine-dextroamphetamine (ADDERALL) 20 MG tablet Take 1 tablet (20 mg total) by mouth daily. 30 tablet 0  . cetirizine (ZYRTEC) 10 MG tablet Take 10 mg by mouth daily.    . citalopram (CELEXA) 40 MG tablet Take 1 tablet (40 mg total) by mouth daily. 30 tablet 2  . clonazePAM (KLONOPIN) 0.5 MG tablet Take 1 tablet (0.5 mg total) by mouth at bedtime as needed for anxiety. 30 tablet 2  . cyanocobalamin (,VITAMIN B-12,) 1000 MCG/ML injection Inject 1 mL (1,000 mcg total) into the muscle every 30 (thirty) days. 1 mL 11  .  cyanocobalamin (,VITAMIN B-12,) 1000 MCG/ML injection Inject 1 mL (1,000 mcg total) into the muscle once. 1 mL 0  . fluticasone (FLONASE) 50 MCG/ACT nasal spray Place 2 sprays into both nostrils daily. 16 g 11  . Linaclotide (LINZESS) 290 MCG CAPS capsule Take 1 capsule (290 mcg total) by mouth daily. 30 capsule 6  . ondansetron (ZOFRAN) 8 MG tablet Take 1 tablet (8 mg total) by mouth every 8 (eight) hours as needed for nausea or vomiting. 30 tablet 3  . traZODone (DESYREL) 50 MG tablet Take 0.5-1 tablets (25-50 mg total) by mouth at bedtime as needed for sleep. 30 tablet 1   Current Facility-Administered Medications  Medication  Dose Route Frequency Provider Last Rate Last Dose  . cyanocobalamin ((VITAMIN B-12)) injection 1,000 mcg  1,000 mcg Intramuscular Once Sherrie Mustache Roselyn Bering, PA-C         Musculoskeletal: Strength & Muscle Tone: within normal limits Gait & Station: normal Patient leans: N/A  Psychiatric Specialty Exam: Review of Systems  Psychiatric/Behavioral: The patient has insomnia.   All other systems reviewed and are negative.   Blood pressure (!) 148/79, pulse 66, temperature 98.6 F (37 C), temperature source Oral, weight 158 lb 9.6 oz (71.9 kg).Body mass index is 27.22 kg/m.  General Appearance: Casual  Eye Contact:  Good  Speech:  Normal Rate  Volume:  Normal  Mood:  Dysphoric  Affect:  Congruent  Thought Process:  Goal Directed and Descriptions of Associations: Intact  Orientation:  Full (Time, Place, and Person)  Thought Content: Logical   Suicidal Thoughts:  No  Homicidal Thoughts:  No  Memory:  Immediate;   Fair Recent;   Fair Remote;   Fair  Judgement:  Fair  Insight:  Fair  Psychomotor Activity:  Normal  Concentration:  Concentration: Fair and Attention Span: Fair  Recall:  [employer] of Knowledge: Fair  Language: Fair  Akathisia:  No  Handed:  Right  AIMS (if indicated): NA  Assets:  Communication Skills Desire for Improvement Financial  Resources/Insurance Housing Social Support Talents/Skills Transportation  ADL's:  Intact  Cognition: WNL  Sleep:  Poor   Screenings: PHQ2-9     Office Visit from 03/13/2016 in [facility] [place] Office Visit from 08/20/2012 in [facility] [place]  PHQ-2 Total Score  0  0       Assessment and Plan: Rosely is a 57 year old Caucasian female who has a history of depression, ADHD, presented to the clinic today for a follow-up visit.  Aubryana continues to struggle with some depressive symptoms as well as sleep problems.  Madilynn is willing to restart trazodone at the lower dose and see if that will help with her sleep.  She denies any suicidality.  Continue plan as noted below.  Plan For depression Continue Celexa 40 mg p.o. daily   For anxiety Celexa 40 mg p.o. daily Continue Klonopin 0.5 mg p.o. nightly as needed.  Discussed with her the risk of being on benzodiazepines as well as the need to wean off slowly.  Discussed with her to cut the Klonopin into half some nights.  Reviewed [place] controlled substance database.  ADHD Change Adderall to 20 mg p.o. daily Reviewed [place] controlled substance database  Insomnia Start trazodone 25 mg p.o. nightly as needed.  Follow-up in 1 month or sooner if needed.  More than 50 % of the time was spent for psychoeducation and supportive psychotherapy and care coordination.  This note was generated in part or whole with voice recognition software. Voice recognition is usually quite accurate but there are transcription errors that can and very often do occur. I apologize for any typographical errors that were not detected and corrected.        Jomarie Longs, MD 12/17/2017, 11:23 AM

## 2017-12-17 ENCOUNTER — Encounter: Payer: Self-pay | Admitting: Psychiatry

## 2018-01-08 ENCOUNTER — Encounter: Payer: Self-pay | Admitting: Psychiatry

## 2018-01-08 ENCOUNTER — Ambulatory Visit: Payer: No Typology Code available for payment source | Admitting: Psychiatry

## 2018-01-08 ENCOUNTER — Other Ambulatory Visit: Payer: Self-pay

## 2018-01-08 VITALS — BP 133/78 | HR 82 | Temp 98.4°F | Wt 162.4 lb

## 2018-01-08 DIAGNOSIS — F411 Generalized anxiety disorder: Secondary | ICD-10-CM

## 2018-01-08 DIAGNOSIS — F900 Attention-deficit hyperactivity disorder, predominantly inattentive type: Secondary | ICD-10-CM

## 2018-01-08 DIAGNOSIS — F331 Major depressive disorder, recurrent, moderate: Secondary | ICD-10-CM

## 2018-01-08 MED ORDER — AMPHETAMINE-DEXTROAMPHETAMINE 20 MG PO TABS: 20.0000 mg | ORAL_TABLET | Freq: Every day | ORAL | 0 refills | Status: DC

## 2018-01-08 NOTE — Progress Notes (Signed)
BH MD  OP Progress Note  01/08/2018 9:26 PM Dana Benson  MRN:  [id number]  Chief Complaint: ' I am overwhelmed." Chief Complaint    Follow-up; Medication Refill; Stress     WUJ:WJXBJ is a 57 year old Caucasian female who is divorced, employed, lives in [place], presented to the clinic today for a follow-up visit.  Today reports she is compliant on her Celexa as well as her ADHD medications as prescribed.  She reports she is kind of overwhelmed today because of her situation.  She reports she is currently working on getting her taxes done before the deadline.  She also reports she has to go and help her sister who is in [place] who is currently relocating to a new house.  Patient reports she is running back and forth to get her things done here so that she can go and help her sister out.Patient reports this has been making her anxious.  She however reports she will feel better once everything is done and does not think her medication needs to be readjusted at this time.  Patient reports the Adderall 20 mg may be helping and she feels her concentration and attention is better than before.  She denies any side effects and reports her increased anxiety symptoms as not due to Adderall but due to her situational stressors.  Patient continues to take Klonopin at bedtime.  Discussed with her again about tapering her Klonopin dosage down.  Patient reports sleep as fair.  She reports that at times when she has vivid dreams at when she sleeps.  She has been taking the trazodone.  Discussed with her to take one whole tablet since she has been taking only half of 50 mg.  She agrees with plan.  Also discussed referral to a psychotherapist to discuss her concerns.  She does not feel she needs to talk to a therapist at this time and will let writer know if she changes her mind.     Visit Diagnosis:    ICD-10-CM   1. MDD (major depressive disorder), recurrent episode, moderate (HCC) F33.1  amphetamine-dextroamphetamine (ADDERALL) 20 MG tablet  2. Attention deficit hyperactivity disorder (ADHD), predominantly inattentive type F90.0   3. GAD (generalized anxiety disorder) F41.1     Past Psychiatric History: Reports a history of depression, ADD, anxiety.  Past trials of Effexor, Paxil, Wellbutrin, Prozac, Lexapro, Cymbalta.  Past Medical History:  Past Medical History:  Diagnosis Date  . ADD (attention deficit disorder with hyperactivity)   . Allergic rhinitis   . Anxiety   . Depression   . GERD (gastroesophageal reflux disease)   . History of chicken pox     Past Surgical History:  Procedure Laterality Date  . APPENDECTOMY  1979  . BLADDER SUSPENSION  1995   during hysterectomy  . CHOLECYSTECTOMY  1985  . TONSILLECTOMY  1970  . VAGINAL HYSTERECTOMY  1995    Family Psychiatric History: Son-depression, sister-ADD, sister-anxiety disorder, depression, mother-depression, maternal aunt-depression.  Family History:  Family History  Problem Relation Age of Onset  . Depression Mother   . Hypertension Mother   . Diabetes Mother   . Depression Sister   . Anxiety disorder Sister   . ADD / ADHD Sister   . Depression Son   . Depression Maternal Aunt   . Breast cancer Maternal Aunt 44  . Breast cancer Maternal Grandmother 60  . Breast cancer Cousin 44  . Breast cancer Maternal Aunt 56   Substance abuse history: Denies  Social History: Pt is currently divorced from her husband.  She is employed with [employer].  She has 2 sons who are living.  Her son ( youngest)passed away. Social History   Socioeconomic History  . Marital status: Single    Spouse name: Not on file  . Number of children: 3  . Years of education: Not on file  . Highest education level: Not on file  Occupational History    Employer: [employer]  Social Needs  . Financial resource strain: Not on file  . Food insecurity:    Worry: Not on file    Inability: Not on file  . Transportation needs:     Medical: Not on file    Non-medical: Not on file  Tobacco Use  . Smoking status: Current Every Day Smoker    Packs/day: 0.50    Years: 20.00    Pack years: 10.00    Types: Cigarettes  . Smokeless tobacco: Never Used  . Tobacco comment: 2 packs a week  Substance and Sexual Activity  . Alcohol use: Yes    Alcohol/week: 0.6 - 1.2 oz    Types: 1 - 2 Glasses of wine per week    Comment: Wine HS occasional  . Drug use: No  . Sexual activity: Never  Lifestyle  . Physical activity:    Days per week: Not on file    Minutes per session: Not on file  . Stress: Not on file  Relationships  . Social connections:    Talks on phone: Not on file    Gets together: Not on file    Attends religious service: Not on file    Active member of club or organization: Not on file    Attends meetings of clubs or organizations: Not on file    Relationship status: Not on file  Other Topics Concern  . Not on file  Social History Narrative   Lives in [place]. Works at [employer].    Allergies:  Allergies  Allergen Reactions  . Prozac [Fluoxetine Hcl] Other (See Comments)    GI ISSUES  . Trazodone And Nefazodone     nightmares  . Wellbutrin [Bupropion] Other (See Comments)    Dizziness and ? Hearing loss  . Prednisone Anxiety    Metabolic Disorder Labs: No results found for: HGBA1C, MPG No results found for: PROLACTIN Lab Results  Component Value Date   CHOL 231 (H) 06/28/2015   TRIG 85.0 06/28/2015   HDL 68.50 06/28/2015   CHOLHDL 3 06/28/2015   VLDL 17.0 06/28/2015   LDLCALC 146 (H) 06/28/2015   LDLCALC 118 (H) 06/03/2014   Lab Results  Component Value Date   TSH 1.12 06/28/2015   TSH 0.93 11/29/2014    Therapeutic Level Labs: No results found for: LITHIUM No results found for: VALPROATE No components found for:  CBMZ  Current Medications: Current Outpatient Medications  Medication Sig Dispense Refill  . [START ON 01/14/2018] amphetamine-dextroamphetamine (ADDERALL) 20 MG  tablet Take 1 tablet (20 mg total) by mouth daily. 30 tablet 0  . cetirizine (ZYRTEC) 10 MG tablet Take 10 mg by mouth daily.    . citalopram (CELEXA) 40 MG tablet Take 1 tablet (40 mg total) by mouth daily. 30 tablet 2  . clonazePAM (KLONOPIN) 0.5 MG tablet Take 1 tablet (0.5 mg total) by mouth at bedtime as needed for anxiety. 30 tablet 2  . cyanocobalamin (,VITAMIN B-12,) 1000 MCG/ML injection Inject 1 mL (1,000 mcg total) into the muscle every 30 (thirty) days.  1 mL 11  . cyanocobalamin (,VITAMIN B-12,) 1000 MCG/ML injection Inject 1 mL (1,000 mcg total) into the muscle once. 1 mL 0  . fluticasone (FLONASE) 50 MCG/ACT nasal spray Place 2 sprays into both nostrils daily. 16 g 11  . Linaclotide (LINZESS) 290 MCG CAPS capsule Take 1 capsule (290 mcg total) by mouth daily. 30 capsule 6  . ondansetron (ZOFRAN) 8 MG tablet Take 1 tablet (8 mg total) by mouth every 8 (eight) hours as needed for nausea or vomiting. 30 tablet 3  . traZODone (DESYREL) 50 MG tablet Take 0.5-1 tablets (25-50 mg total) by mouth at bedtime as needed for sleep. 30 tablet 1   Current Facility-Administered Medications  Medication Dose Route Frequency Provider Last Rate Last Dose  . cyanocobalamin ((VITAMIN B-12)) injection 1,000 mcg  1,000 mcg Intramuscular Once Faythe GheeFisher, Susan W, PA-C         Musculoskeletal: Strength & Muscle Tone: within normal limits Gait & Station: normal Patient leans: N/A  Psychiatric Specialty Exam: Review of Systems  Psychiatric/Behavioral: The patient is nervous/anxious.   All other systems reviewed and are negative.   Blood pressure 133/78, pulse 82, temperature 98.4 F (36.9 C), temperature source Oral, weight 162 lb 6.4 oz (73.7 kg).Body mass index is 27.88 kg/m.  General Appearance: Casual  Eye Contact:  Fair  Speech:  Clear and Coherent  Volume:  Normal  Mood:  Anxious  Affect:  Congruent  Thought Process:  Goal Directed and Descriptions of Associations: Intact  Orientation:   Full (Time, Place, and Person)  Thought Content: Logical   Suicidal Thoughts:  No  Homicidal Thoughts:  No  Memory:  Immediate;   Fair Recent;   Fair Remote;   Fair  Judgement:  Fair  Insight:  Fair  Psychomotor Activity:  Normal  Concentration:  Concentration: Fair and Attention Span: Fair  Recall:  [employer]  Fund of Knowledge: Fair  Language: Fair  Akathisia:  No  Handed:  Right  AIMS (if indicated): na  Assets:  Communication Skills Desire for Improvement Housing Social Support Talents/Skills  ADL's:  Intact  Cognition: WNL  Sleep:  some vivid dreams   Screenings: PHQ2-9     Office Visit from 03/13/2016 in [facility] [place] Office Visit from 08/20/2012 in [facility] [place]  PHQ-2 Total Score  0  0       Assessment and Plan: [name] is a 57 year old Caucasian female who has a history of depression, ADHD, presented to the clinic today for a follow-up visit.  [name] seems to be all overwhelmed and anxious today.  She also reports some vivid dreams at night.  Discussed medication changes with patient.  Also discussed psychotherapy with patient.  Discussed plan as noted below.  Plan For depression Continue Celexa 40 mg p.o. daily  For anxiety Celexa 40 mg p.o. daily Continue Klonopin 0.5 mg p.o. nightly as needed.  Discussed with her to wean off Klonopin gradually.  Discussed with her to take half of 0.5 mg at least some nights.  Discussed the risk of Klonopin on her cognitive function.  Insomnia Patient reports some vivid dreams. Discussed with patient to take trazodone 25-50 mg p.o. nightly.  Unknown if trazodone is contributing to her vivid dreams.  She is also on Klonopin which can also cause cognitive issues.  Discussed with patient to keep track of her sx and she will let writer know.  ADHD Continue Adderall 20 mg p.o. daily Reviewed [place] controlled substance database.  Discussed with patient  about referral to psychotherapist.  She reports  she will think about it and let writer know.  More than 50 % of the time was spent for psychoeducation and supportive psychotherapy and care coordination.  This note was generated in part or whole with voice recognition software. Voice recognition is usually quite accurate but there are transcription errors that can and very often do occur. I apologize for any typographical errors that were not detected and corrected.     Jomarie Longs, MD 01/08/2018, 9:26 PM

## 2018-02-05 ENCOUNTER — Ambulatory Visit: Payer: No Typology Code available for payment source | Admitting: Psychiatry

## 2018-02-05 ENCOUNTER — Encounter: Payer: Self-pay | Admitting: Psychiatry

## 2018-02-05 ENCOUNTER — Other Ambulatory Visit: Payer: Self-pay

## 2018-02-05 VITALS — BP 148/81 | HR 93 | Temp 98.1°F | Wt 157.8 lb

## 2018-02-05 DIAGNOSIS — F410 Panic disorder [episodic paroxysmal anxiety] without agoraphobia: Secondary | ICD-10-CM

## 2018-02-05 DIAGNOSIS — F331 Major depressive disorder, recurrent, moderate: Secondary | ICD-10-CM | POA: Diagnosis not present

## 2018-02-05 DIAGNOSIS — F900 Attention-deficit hyperactivity disorder, predominantly inattentive type: Secondary | ICD-10-CM | POA: Diagnosis not present

## 2018-02-05 DIAGNOSIS — F411 Generalized anxiety disorder: Secondary | ICD-10-CM | POA: Diagnosis not present

## 2018-02-05 MED ORDER — BUSPIRONE HCL 10 MG PO TABS: 10.0000 mg | ORAL_TABLET | Freq: Two times a day (BID) | ORAL | 1 refills | Status: DC

## 2018-02-05 MED ORDER — AMPHETAMINE-DEXTROAMPHETAMINE 20 MG PO TABS: 20.0000 mg | ORAL_TABLET | Freq: Every day | ORAL | 0 refills | Status: DC

## 2018-02-05 NOTE — Progress Notes (Signed)
BH MD OP Progress Note  02/05/2018 4:42 PM Dana Benson  MRN:  [id number]  Chief Complaint: ' I am here for follow up.' Chief Complaint    Follow-up; Medication Refill     HPI: Dana Benson is a 57 year old Caucasian female who is divorced, employed, lives in [place], presented to the clinic today for a follow-up visit.  Patient today reports she continues to struggle with depressive symptoms.  She reports some sadness and crying spells.  She continues to ruminate about her son who passed away 5 years ago.  She however refuses to discuss or talk about it.  She also has been declining psychotherapy even though it was discussed with her several times.  Discussed psychotherapy again with patient today.  Discussed with her that she can be referred to Ms. Felecia Jan who may be able to see her on a more frequent basis.  Patient filled out the form for the referral and gave it to writer today.  Patient reports she has been taking the medications as prescribed.  She however reports she is more stressed out even though she is compliant on the medications.  Discussed adding BuSpar .  Agreed with plan.  Patient reports work is going well.  She continues to take the Adderall which is helping with her concentration.  Patient reports she was taking the trazodone 50 mg however even though it helped initially she has been struggling with some restlessness at night recently. Discussed with patient about adding hydroxyzine as needed to her trazodone.  She however would like to try the BuSpar first before she makes more medication readjustments.  Patient denies any suicidality.  Patient denies any perceptual disturbances. Visit Diagnosis:    ICD-10-CM   1. MDD (major depressive disorder), recurrent episode, moderate (HCC) F33.1 amphetamine-dextroamphetamine (ADDERALL) 20 MG tablet  2. Attention deficit hyperactivity disorder (ADHD), predominantly inattentive type F90.0   3. GAD (generalized anxiety disorder)  F41.1   4. Panic attack F41.0     Past Psychiatric History: I have reviewed psychiatric history from my progress note on 01/08/2018.  Past trials of Effexor, Paxil, Wellbutrin, Prozac, Lexapro, Cymbalta.  Past Medical History:  Past Medical History:  Diagnosis Date  . ADD (attention deficit disorder with hyperactivity)   . Allergic rhinitis   . Anxiety   . Depression   . GERD (gastroesophageal reflux disease)   . History of chicken pox     Past Surgical History:  Procedure Laterality Date  . APPENDECTOMY  1979  . BLADDER SUSPENSION  1995   during hysterectomy  . CHOLECYSTECTOMY  1985  . TONSILLECTOMY  1970  . VAGINAL HYSTERECTOMY  1995    Family Psychiatric History: Have reviewed family psychiatric history from my progress note on 01/08/2018.  Family History:  Family History  Problem Relation Age of Onset  . Depression Mother   . Hypertension Mother   . Diabetes Mother   . Depression Sister   . Anxiety disorder Sister   . ADD / ADHD Sister   . Depression Son   . Depression Maternal Aunt   . Breast cancer Maternal Aunt 44  . Breast cancer Maternal Grandmother 60  . Breast cancer Cousin 44  . Breast cancer Maternal Aunt 71   Substance abuse history: Denies  Social History: Patient is currently divorced from her husband.  She is employed with [employer].  She has 2 sons were living.  Her younger son passed away. Social History   Socioeconomic History  . Marital status: Single  Spouse name: Not on file  . Number of children: 3  . Years of education: Not on file  . Highest education level: Not on file  Occupational History    Employer: [employer]  Social Needs  . Financial resource strain: Not on file  . Food insecurity:    Worry: Not on file    Inability: Not on file  . Transportation needs:    Medical: Not on file    Non-medical: Not on file  Tobacco Use  . Smoking status: Current Every Day Smoker    Packs/day: 0.50    Years: 20.00    Pack years: 10.00     Types: Cigarettes  . Smokeless tobacco: Never Used  . Tobacco comment: 2 packs a week  Substance and Sexual Activity  . Alcohol use: Yes    Alcohol/week: 0.6 - 1.2 oz    Types: 1 - 2 Glasses of wine per week    Comment: Wine HS occasional  . Drug use: No  . Sexual activity: Never  Lifestyle  . Physical activity:    Days per week: Not on file    Minutes per session: Not on file  . Stress: Not on file  Relationships  . Social connections:    Talks on phone: Not on file    Gets together: Not on file    Attends religious service: Not on file    Active member of club or organization: Not on file    Attends meetings of clubs or organizations: Not on file    Relationship status: Not on file  Other Topics Concern  . Not on file  Social History Narrative   Lives in [place]. Works at [employer].    Allergies:  Allergies  Allergen Reactions  . Prozac [Fluoxetine Hcl] Other (See Comments)    GI ISSUES  . Trazodone And Nefazodone     nightmares  . Wellbutrin [Bupropion] Other (See Comments)    Dizziness and ? Hearing loss  . Prednisone Anxiety    Metabolic Disorder Labs: No results found for: HGBA1C, MPG No results found for: PROLACTIN Lab Results  Component Value Date   CHOL 231 (H) 06/28/2015   TRIG 85.0 06/28/2015   HDL 68.50 06/28/2015   CHOLHDL 3 06/28/2015   VLDL 17.0 06/28/2015   LDLCALC 146 (H) 06/28/2015   LDLCALC 118 (H) 06/03/2014   Lab Results  Component Value Date   TSH 1.12 06/28/2015   TSH 0.93 11/29/2014    Therapeutic Level Labs: No results found for: LITHIUM No results found for: VALPROATE No components found for:  CBMZ  Current Medications: Current Outpatient Medications  Medication Sig Dispense Refill  . [START ON 03/04/2018] amphetamine-dextroamphetamine (ADDERALL) 20 MG tablet Take 1 tablet (20 mg total) by mouth daily. 30 tablet 0  . cetirizine (ZYRTEC) 10 MG tablet Take 10 mg by mouth daily.    . citalopram (CELEXA) 40 MG tablet Take 1  tablet (40 mg total) by mouth daily. 30 tablet 2  . clonazePAM (KLONOPIN) 0.5 MG tablet Take 1 tablet (0.5 mg total) by mouth at bedtime as needed for anxiety. 30 tablet 2  . cyanocobalamin (,VITAMIN B-12,) 1000 MCG/ML injection Inject 1 mL (1,000 mcg total) into the muscle every 30 (thirty) days. 1 mL 11  . cyanocobalamin (,VITAMIN B-12,) 1000 MCG/ML injection Inject 1 mL (1,000 mcg total) into the muscle once. 1 mL 0  . fluticasone (FLONASE) 50 MCG/ACT nasal spray Place 2 sprays into both nostrils daily. 16 g 11  .  Linaclotide (LINZESS) 290 MCG CAPS capsule Take 1 capsule (290 mcg total) by mouth daily. 30 capsule 6  . ondansetron (ZOFRAN) 8 MG tablet Take 1 tablet (8 mg total) by mouth every 8 (eight) hours as needed for nausea or vomiting. 30 tablet 3  . traZODone (DESYREL) 50 MG tablet Take 0.5-1 tablets (25-50 mg total) by mouth at bedtime as needed for sleep. 30 tablet 1  . busPIRone (BUSPAR) 10 MG tablet Take 1 tablet (10 mg total) by mouth 2 (two) times daily. Take it at 12 pm and 7 pm 60 tablet 1   Current Facility-Administered Medications  Medication Dose Route Frequency Provider Last Rate Last Dose  . cyanocobalamin ((VITAMIN B-12)) injection 1,000 mcg  1,000 mcg Intramuscular Once Sherrie Mustache Roselyn Bering, PA-C         Musculoskeletal: Strength & Muscle Tone: within normal limits Gait & Station: normal Patient leans: N/A  Psychiatric Specialty Exam: Review of Systems  Psychiatric/Behavioral: Positive for depression. Negative for suicidal ideas. The patient is nervous/anxious.   All other systems reviewed and are negative.   Blood pressure (!) 148/81, pulse 93, temperature 98.1 F (36.7 C), temperature source Oral, weight 157 lb 12.8 oz (71.6 kg).Body mass index is 27.09 kg/m.  General Appearance: Casual  Eye Contact:  Fair  Speech:  Clear and Coherent  Volume:  Normal  Mood:  Anxious  Affect:  Congruent  Thought Process:  Goal Directed and Descriptions of Associations: Intact   Orientation:  Full (Time, Place, and Person)  Thought Content: Logical   Suicidal Thoughts:  No  Homicidal Thoughts:  No  Memory:  Immediate;   Fair Recent;   Fair Remote;   Fair  Judgement:  Fair  Insight:  Fair  Psychomotor Activity:  Normal  Concentration:  Concentration: Fair and Attention Span: Fair  Recall:  [employer] of Knowledge: Fair  Language: Fair  Akathisia:  No  Handed:  Right  AIMS (if indicated): na  Assets:  Communication Skills Desire for Improvement Housing  ADL's:  Intact  Cognition: WNL  Sleep:  restless   Screenings: PHQ2-9     Office Visit from 03/13/2016 in [facility] Office Visit from 08/20/2012 in [facility]  PHQ-2 Total Score  0  0       Assessment and Plan: Marthann is a 57 year old Caucasian female who has a history of depression, ADHD, presented to the clinic today for a follow-up visit.  Patient today reports some depressive symptoms as well as sleep issues.  Discussed medication readjustment as well as referral for psychotherapy with patient.  Plan as noted below.  Plan For depression Continue Celexa 40 mg p.o. daily Add BuSpar 5 mg p.o. twice daily  For anxiety Celexa 40 mg p.o. daily Continue Klonopin 0.5 mg p.o. nightly as needed , patient is aware about the risk of being on benzodiazepine therapy long-term.  Patient has been advised to limit use.  Insomnia Continue trazodone 50 mg p.o. nightly  ADHD Continue Adderall 20 mg p.o. daily Reviewed [place] controlled substance database  Discussed CBT referral with patient.  I have referred her to Ms. Felecia Jan, and will await response.  Follow-up in clinic in 4-6 weeks or sooner if needed.  More than 50 % of the time was spent for psychoeducation and supportive psychotherapy and care coordination.  This note was generated in part or whole with voice recognition software. Voice recognition is usually quite accurate but there are transcription  errors that can  and very often do occur. I apologize for any typographical errors that were not detected and corrected.       Jomarie Longs, MD 02/06/2018, 8:54 AM

## 2018-02-05 NOTE — Patient Instructions (Signed)
Buspirone tablets What is this medicine? BUSPIRONE (byoo SPYE rone) is used to treat anxiety disorders. This medicine may be used for other purposes; ask your health care provider or pharmacist if you have questions. COMMON BRAND NAME(S): BuSpar What should I tell my health care provider before I take this medicine? They need to know if you have any of these conditions: -kidney or liver disease -an unusual or allergic reaction to buspirone, other medicines, foods, dyes, or preservatives -pregnant or trying to get pregnant -breast-feeding How should I use this medicine? Take this medicine by mouth with a glass of water. Follow the directions on the prescription label. You may take this medicine with or without food. To ensure that this medicine always works the same way for you, you should take it either always with or always without food. Take your doses at regular intervals. Do not take your medicine more often than directed. Do not stop taking except on the advice of your doctor or health care professional. Talk to your pediatrician regarding the use of this medicine in children. Special care may be needed. Overdosage: If you think you have taken too much of this medicine contact a poison control center or emergency room at once. NOTE: This medicine is only for you. Do not share this medicine with others. What if I miss a dose? If you miss a dose, take it as soon as you can. If it is almost time for your next dose, take only that dose. Do not take double or extra doses. What may interact with this medicine? Do not take this medicine with any of the following medications: -linezolid -MAOIs like Carbex, Eldepryl, Marplan, Nardil, and Parnate -methylene blue -procarbazine This medicine may also interact with the following medications: -diazepam -digoxin -diltiazem -erythromycin -grapefruit juice -haloperidol -medicines for mental depression or mood problems -medicines for seizures like  carbamazepine, phenobarbital and phenytoin -nefazodone -other medications for anxiety -rifampin -ritonavir -some antifungal medicines like itraconazole, ketoconazole, and voriconazole -verapamil -warfarin This list may not describe all possible interactions. Give your health care provider a list of all the medicines, herbs, non-prescription drugs, or dietary supplements you use. Also tell them if you smoke, drink alcohol, or use illegal drugs. Some items may interact with your medicine. What should I watch for while using this medicine? Visit your doctor or health care professional for regular checks on your progress. It may take 1 to 2 weeks before your anxiety gets better. You may get drowsy or dizzy. Do not drive, use machinery, or do anything that needs mental alertness until you know how this drug affects you. Do not stand or sit up quickly, especially if you are an older patient. This reduces the risk of dizzy or fainting spells. Alcohol can make you more drowsy and dizzy. Avoid alcoholic drinks. What side effects may I notice from receiving this medicine? Side effects that you should report to your doctor or health care professional as soon as possible: -blurred vision or other vision changes -chest pain -confusion -difficulty breathing -feelings of hostility or anger -muscle aches and pains -numbness or tingling in hands or feet -ringing in the ears -skin rash and itching -vomiting -weakness Side effects that usually do not require medical attention (report to your doctor or health care professional if they continue or are bothersome): -disturbed dreams, nightmares -headache -nausea -restlessness or nervousness -sore throat and nasal congestion -stomach upset This list may not describe all possible side effects. Call your doctor for medical advice about side   effects. You may report side effects to FDA at 1-800-FDA-1088. Where should I keep my medicine? Keep out of the reach  of children. Store at room temperature below 30 degrees C (86 degrees F). Protect from light. Keep container tightly closed. Throw away any unused medicine after the expiration date. NOTE: This sheet is a summary. It may not cover all possible information. If you have questions about this medicine, talk to your doctor, pharmacist, or health care provider.  2018 Elsevier/Gold Standard (2010-04-26 18:06:11)  

## 2018-02-06 ENCOUNTER — Encounter: Payer: Self-pay | Admitting: Psychiatry

## 2018-03-02 ENCOUNTER — Other Ambulatory Visit
Admission: RE | Admit: 2018-03-02 | Discharge: 2018-03-02 | Disposition: A | Discharge status: Home / Self Care | Payer: No Typology Code available for payment source | Source: Ambulatory Visit | Attending: Physician Assistant | Admitting: Physician Assistant

## 2018-03-02 DIAGNOSIS — R5383 Other fatigue: Secondary | ICD-10-CM | POA: Insufficient documentation

## 2018-03-02 DIAGNOSIS — E538 Deficiency of other specified B group vitamins: Secondary | ICD-10-CM | POA: Insufficient documentation

## 2018-03-02 DIAGNOSIS — Z0000 Encounter for general adult medical examination without abnormal findings: Secondary | ICD-10-CM | POA: Insufficient documentation

## 2018-03-02 LAB — HEMOGLOBIN A1C
Hgb A1c MFr Bld: 5.8 % — ABNORMAL HIGH (ref 4.8–5.6)
Mean Plasma Glucose: 119.76 mg/dL

## 2018-03-02 LAB — CBC WITH DIFFERENTIAL/PLATELET
Basophils Absolute: 0.1 10*3/uL (ref 0–0.1)
Basophils Relative: 1 %
Eosinophils Absolute: 0.1 10*3/uL (ref 0–0.7)
Eosinophils Relative: 2 %
HEMATOCRIT: 40.3 % (ref 35.0–47.0)
HEMOGLOBIN: 14.1 g/dL (ref 12.0–16.0)
LYMPHS ABS: 2 10*3/uL (ref 1.0–3.6)
Lymphocytes Relative: 27 %
MCH: 32.5 pg (ref 26.0–34.0)
MCHC: 35 g/dL (ref 32.0–36.0)
MCV: 93 fL (ref 80.0–100.0)
MONO ABS: 0.6 10*3/uL (ref 0.2–0.9)
MONOS PCT: 8 %
NEUTROS ABS: 4.5 10*3/uL (ref 1.4–6.5)
NEUTROS PCT: 62 %
Platelets: 278 10*3/uL (ref 150–440)
RBC: 4.34 MIL/uL (ref 3.80–5.20)
RDW: 13.1 % (ref 11.5–14.5)
WBC: 7.3 10*3/uL (ref 3.6–11.0)

## 2018-03-02 LAB — URINALYSIS, COMPLETE (UACMP) WITH MICROSCOPIC
Bilirubin Urine: NEGATIVE
Glucose, UA: NEGATIVE mg/dL
HGB URINE DIPSTICK: NEGATIVE
Ketones, ur: NEGATIVE mg/dL
Leukocytes, UA: NEGATIVE
Nitrite: NEGATIVE
Protein, ur: NEGATIVE mg/dL
Specific Gravity, Urine: 1.005 (ref 1.005–1.030)
WBC, UA: NONE SEEN WBC/hpf (ref 0–5)
pH: 7 (ref 5.0–8.0)

## 2018-03-02 LAB — VITAMIN B12: Vitamin B-12: 304 pg/mL (ref 180–914)

## 2018-03-02 LAB — LIPID PANEL
CHOLESTEROL: 225 mg/dL — AB (ref 0–200)
HDL: 56 mg/dL (ref >40)
LDL Cholesterol: 152 mg/dL — ABNORMAL HIGH (ref 0–99)
TRIGLYCERIDES: 85 mg/dL (ref <150)
Total CHOL/HDL Ratio: 4 RATIO
VLDL: 17 mg/dL (ref 0–40)

## 2018-03-02 LAB — COMPREHENSIVE METABOLIC PANEL
ALK PHOS: 106 U/L (ref 38–126)
ALT: 28 U/L (ref 14–54)
ANION GAP: 10 (ref 5–15)
AST: 27 U/L (ref 15–41)
Albumin: 4.5 g/dL (ref 3.5–5.0)
BILIRUBIN TOTAL: 0.6 mg/dL (ref 0.3–1.2)
BUN: 10 mg/dL (ref 6–20)
CALCIUM: 9.8 mg/dL (ref 8.9–10.3)
CO2: 23 mmol/L (ref 22–32)
Chloride: 102 mmol/L (ref 101–111)
Creatinine, Ser: 0.87 mg/dL (ref 0.44–1.00)
Glucose, Bld: 105 mg/dL — ABNORMAL HIGH (ref 65–99)
Potassium: 4.4 mmol/L (ref 3.5–5.1)
Sodium: 135 mmol/L (ref 135–145)
Total Protein: 7.5 g/dL (ref 6.5–8.1)

## 2018-03-02 LAB — TSH: TSH: 1.159 u[IU]/mL (ref 0.350–4.500)

## 2018-03-03 LAB — VITAMIN D 25 HYDROXY (VIT D DEFICIENCY, FRACTURES): VIT D 25 HYDROXY: 31.3 ng/mL (ref 30.0–100.0)

## 2018-03-06 ENCOUNTER — Other Ambulatory Visit: Payer: Self-pay | Admitting: Psychiatry

## 2018-03-06 DIAGNOSIS — F331 Major depressive disorder, recurrent, moderate: Secondary | ICD-10-CM

## 2018-03-06 DIAGNOSIS — F900 Attention-deficit hyperactivity disorder, predominantly inattentive type: Secondary | ICD-10-CM

## 2018-04-01 ENCOUNTER — Telehealth: Payer: Self-pay

## 2018-04-01 ENCOUNTER — Other Ambulatory Visit: Payer: Self-pay | Admitting: Psychiatry

## 2018-04-01 DIAGNOSIS — F331 Major depressive disorder, recurrent, moderate: Secondary | ICD-10-CM

## 2018-04-01 DIAGNOSIS — F900 Attention-deficit hyperactivity disorder, predominantly inattentive type: Secondary | ICD-10-CM

## 2018-04-01 DIAGNOSIS — F410 Panic disorder [episodic paroxysmal anxiety] without agoraphobia: Secondary | ICD-10-CM

## 2018-04-01 MED ORDER — AMPHETAMINE-DEXTROAMPHETAMINE 20 MG PO TABS: 20.0000 mg | ORAL_TABLET | Freq: Every day | ORAL | 0 refills | Status: DC

## 2018-04-01 MED ORDER — TRAZODONE HCL 50 MG PO TABS: 25.0000 mg | ORAL_TABLET | Freq: Every evening | ORAL | 1 refills | Status: DC | PRN

## 2018-04-01 MED ORDER — CITALOPRAM HYDROBROMIDE 40 MG PO TABS: 40.0000 mg | ORAL_TABLET | Freq: Every day | ORAL | 2 refills | Status: DC

## 2018-04-01 MED ORDER — CLONAZEPAM 0.5 MG PO TABS: 0.5000 mg | ORAL_TABLET | Freq: Every evening | ORAL | 2 refills | Status: DC | PRN

## 2018-04-01 MED ORDER — BUSPIRONE HCL 10 MG PO TABS: 10.0000 mg | ORAL_TABLET | Freq: Two times a day (BID) | ORAL | 1 refills | Status: DC

## 2018-04-01 NOTE — Telephone Encounter (Signed)
pt called states she is leaving on vacation tomorrow and will not be back until the 16th she needs refills on her medication she will not have enough to get to her next appt on  05-08-18 need celexa,, trazodone, buspar, adderal, and clonazepam

## 2018-04-01 NOTE — Telephone Encounter (Signed)
Sent all her prescriptions to pharmacy

## 2018-05-08 ENCOUNTER — Encounter: Payer: Self-pay | Admitting: Psychiatry

## 2018-05-08 ENCOUNTER — Other Ambulatory Visit: Payer: Self-pay

## 2018-05-08 ENCOUNTER — Ambulatory Visit: Payer: No Typology Code available for payment source | Admitting: Psychiatry

## 2018-05-08 VITALS — BP 158/81 | HR 68 | Temp 97.9°F | Wt 154.4 lb

## 2018-05-08 DIAGNOSIS — F900 Attention-deficit hyperactivity disorder, predominantly inattentive type: Secondary | ICD-10-CM | POA: Diagnosis not present

## 2018-05-08 DIAGNOSIS — F410 Panic disorder [episodic paroxysmal anxiety] without agoraphobia: Secondary | ICD-10-CM

## 2018-05-08 DIAGNOSIS — F411 Generalized anxiety disorder: Secondary | ICD-10-CM | POA: Diagnosis not present

## 2018-05-08 DIAGNOSIS — F331 Major depressive disorder, recurrent, moderate: Secondary | ICD-10-CM

## 2018-05-08 MED ORDER — TRAZODONE HCL 50 MG PO TABS: 25.0000 mg | ORAL_TABLET | Freq: Every evening | ORAL | 1 refills | Status: DC | PRN

## 2018-05-08 MED ORDER — AMPHETAMINE-DEXTROAMPHETAMINE 20 MG PO TABS: 20.0000 mg | ORAL_TABLET | Freq: Every day | ORAL | 0 refills | Status: DC

## 2018-05-08 MED ORDER — BUSPIRONE HCL 10 MG PO TABS: 10.0000 mg | ORAL_TABLET | Freq: Three times a day (TID) | ORAL | 2 refills | Status: DC

## 2018-05-08 NOTE — Progress Notes (Signed)
BH MD OP Progress Note  05/08/2018 12:07 PM Dana MayhewLinda M Benson  MRN:  [id number]  Chief Complaint: ' I am here for follow up." Chief Complaint    Follow-up; Medication Refill     HPI: Dana Benson is a 57 year old Caucasian female who is divorced, employed, lives in [place], presented to the clinic today for a follow-up visit.  Patient today reports she had a good break during the summer which she spent with family.  She reports she did not want to come back since she enjoyed it.  Patient reports there were a few times last month when she found herself in a dark place and felt depressed.  Patient however reports she does not feel that way anymore.  She reports sleep is fair on the trazodone.  She reports that at times when she feels irritable at work.  She has been taking the BuSpar twice a day which has been working.  Discussed increasing the dosage today she agrees with plan.  She denies any side effects to the BuSpar.  She continues to take Adderall as prescribed.  Denies any side effects.  Discussed again about CBT referral.  Patient was referred to Ms. Felecia Janina Thompson however she has not returned her calls yet.  Discussed with patient to reach out to Ms. Janee Mornhompson and she agreed. Visit Diagnosis:    ICD-10-CM   1. MDD (major depressive disorder), recurrent episode, moderate (HCC) F33.1 traZODone (DESYREL) 50 MG tablet    amphetamine-dextroamphetamine (ADDERALL) 20 MG tablet  2. Attention deficit hyperactivity disorder (ADHD), predominantly inattentive type F90.0 traZODone (DESYREL) 50 MG tablet  3. Panic attack F41.0   4. GAD (generalized anxiety disorder) F41.1     Past Psychiatric History: Reviewed past psychiatric history from my progress note on 01/08/2018.  Past trials of Effexor, Paxil, Wellbutrin, Prozac, Lexapro, Cymbalta.  Past Medical History:  Past Medical History:  Diagnosis Date  . ADD (attention deficit disorder with hyperactivity)   . Allergic rhinitis   . Anxiety   .  Depression   . GERD (gastroesophageal reflux disease)   . History of chicken pox     Past Surgical History:  Procedure Laterality Date  . APPENDECTOMY  1979  . BLADDER SUSPENSION  1995   during hysterectomy  . CHOLECYSTECTOMY  1985  . TONSILLECTOMY  1970  . VAGINAL HYSTERECTOMY  1995    Family Psychiatric History: Reviewed family psychiatric history from my progress note on 01/08/2018.  Family History:  Family History  Problem Relation Age of Onset  . Depression Mother   . Hypertension Mother   . Diabetes Mother   . Depression Sister   . Anxiety disorder Sister   . ADD / ADHD Sister   . Depression Son   . Depression Maternal Aunt   . Breast cancer Maternal Aunt 44  . Breast cancer Maternal Grandmother 60  . Breast cancer Cousin 44  . Breast cancer Maternal Aunt [id number]    Social History: Reviewed social history from my progress note on 01/08/2018. Social History   Socioeconomic History  . Marital status: Single    Spouse name: Not on file  . Number of children: 3  . Years of education: Not on file  . Highest education level: Not on file  Occupational History    Employer: [employer]  Social Needs  . Financial resource strain: Not on file  . Food insecurity:    Worry: Not on file    Inability: Not on file  . Transportation needs:  Medical: Not on file    Non-medical: Not on file  Tobacco Use  . Smoking status: Current Every Day Smoker    Packs/day: 0.50    Years: 20.00    Pack years: 10.00    Types: Cigarettes  . Smokeless tobacco: Never Used  . Tobacco comment: 2 packs a week  Substance and Sexual Activity  . Alcohol use: Yes    Alcohol/week: 1.0 - 2.0 standard drinks    Types: 1 - 2 Glasses of wine per week    Comment: Wine HS occasional  . Drug use: No  . Sexual activity: Never  Lifestyle  . Physical activity:    Days per week: Not on file    Minutes per session: Not on file  . Stress: Not on file  Relationships  . Social connections:    Talks on phone:  Not on file    Gets together: Not on file    Attends religious service: Not on file    Active member of club or organization: Not on file    Attends meetings of clubs or organizations: Not on file    Relationship status: Not on file  Other Topics Concern  . Not on file  Social History Narrative   Lives in [place]. Works at [employer].    Allergies:  Allergies  Allergen Reactions  . Prozac [Fluoxetine Hcl] Other (See Comments)    GI ISSUES  . Trazodone And Nefazodone     nightmares  . Wellbutrin [Bupropion] Other (See Comments)    Dizziness and ? Hearing loss  . Prednisone Anxiety    Metabolic Disorder Labs: Lab Results  Component Value Date   HGBA1C 5.8 (H) 03/02/2018   MPG 119.76 03/02/2018   No results found for: PROLACTIN Lab Results  Component Value Date   CHOL 225 (H) 03/02/2018   TRIG 85 03/02/2018   HDL 56 03/02/2018   CHOLHDL 4.0 03/02/2018   VLDL 17 03/02/2018   LDLCALC 152 (H) 03/02/2018   LDLCALC 146 (H) 06/28/2015   Lab Results  Component Value Date   TSH 1.159 03/02/2018   TSH 1.12 06/28/2015    Therapeutic Level Labs: No results found for: LITHIUM No results found for: VALPROATE No components found for:  CBMZ  Current Medications: Current Outpatient Medications  Medication Sig Dispense Refill  . amphetamine-dextroamphetamine (ADDERALL) 20 MG tablet Take 1 tablet (20 mg total) by mouth daily. 30 tablet 0  . [START ON 06/08/2018] amphetamine-dextroamphetamine (ADDERALL) 20 MG tablet Take 1 tablet (20 mg total) by mouth daily. 30 tablet 0  . busPIRone (BUSPAR) 10 MG tablet Take 1 tablet (10 mg total) by mouth 3 (three) times daily. 90 tablet 2  . cetirizine (ZYRTEC) 10 MG tablet Take 10 mg by mouth daily.    . citalopram (CELEXA) 40 MG tablet Take 1 tablet (40 mg total) by mouth daily. 30 tablet 2  . clonazePAM (KLONOPIN) 0.5 MG tablet Take 1 tablet (0.5 mg total) by mouth at bedtime as needed for anxiety. 30 tablet 2  . cyanocobalamin (,VITAMIN  B-12,) 1000 MCG/ML injection Inject 1 mL (1,000 mcg total) into the muscle every 30 (thirty) days. 1 mL 11  . cyanocobalamin (,VITAMIN B-12,) 1000 MCG/ML injection Inject 1 mL (1,000 mcg total) into the muscle once. 1 mL 0  . fluticasone (FLONASE) 50 MCG/ACT nasal spray Place 2 sprays into both nostrils daily. 16 g 11  . Linaclotide (LINZESS) 290 MCG CAPS capsule Take 1 capsule (290 mcg total) by mouth daily. 30  capsule 6  . ondansetron (ZOFRAN) 8 MG tablet Take 1 tablet (8 mg total) by mouth every 8 (eight) hours as needed for nausea or vomiting. 30 tablet 3  . traZODone (DESYREL) 50 MG tablet Take 0.5-1 tablets (25-50 mg total) by mouth at bedtime as needed for sleep. 30 tablet 1  . amphetamine-dextroamphetamine (ADDERALL) 20 MG tablet Take by mouth.     Current Facility-Administered Medications  Medication Dose Route Frequency Provider Last Rate Last Dose  . cyanocobalamin ((VITAMIN B-12)) injection 1,000 mcg  1,000 mcg Intramuscular Once Faythe Ghee, PA-C         Musculoskeletal: Strength & Muscle Tone: within normal limits Gait & Station: normal Patient leans: N/A  Psychiatric Specialty Exam: Review of Systems  Psychiatric/Behavioral: The patient is nervous/anxious.   All other systems reviewed and are negative.   Blood pressure (!) 158/81, pulse 68, temperature 97.9 F (36.6 C), temperature source Oral, weight 154 lb 6.4 oz (70 kg).Body mass index is 26.5 kg/m.  General Appearance: Casual  Eye Contact:  Fair  Speech:  Clear and Coherent  Volume:  Normal  Mood:  Anxious  Affect:  Congruent  Thought Process:  Goal Directed and Descriptions of Associations: Intact  Orientation:  Full (Time, Place, and Person)  Thought Content: Logical   Suicidal Thoughts:  No  Homicidal Thoughts:  No  Memory:  Immediate;   Fair Recent;   Fair Remote;   Fair  Judgement:  Fair  Insight:  Fair  Psychomotor Activity:  Normal  Concentration:  Concentration: Fair and Attention Span: Fair   Recall:  [employer] of Knowledge: Fair  Language: Fair  Akathisia:  No  Handed:  Right  AIMS (if indicated): na  Assets:  Communication Skills Desire for Improvement Social Support  ADL's:  Intact  Cognition: WNL  Sleep:  Fair   Screenings: PHQ2-9     Office Visit from 03/13/2016 in [facility] Office Visit from 08/20/2012 in [facility]  PHQ-2 Total Score  0  0       Assessment and Plan: Lilja is a 57 year old Caucasian female who has a history of depression, ADHD, presented to the clinic today for a follow-up visit.  Patient reports she has some irritability on and off.  She otherwise is doing okay.  Discussed starting CBT again with Ms. Felecia Jan.  Plan as noted below.  Plan For depression Continue Celexa 40 mg p.o. daily Increase BuSpar to 10 mg p.o. 3 times daily.   For insomnia Trazodone 25-50 mg p.o. nightly as needed  For anxiety symptoms Celexa as prescribed Increase BuSpar to 10 mg p.o. 3 times daily Continue Klonopin as prescribed.  For ADHD Continue Adderall 20 mg p.o. daily. Provided her with 1 extra prescription today dated to be filled on or after 06/08/2018.  She has has a prescription pending to be filled in the pharmacy which was dated to be filled after 05/01/2018.  Provided her with information for Ms. Felecia Jan again, patient was referred to her however has not returned her calls.  Follow-up in clinic in 6 weeks  More than 50 % of the time was spent for psychoeducation and supportive psychotherapy and care coordination.  This note was generated in part or whole with voice recognition software. Voice recognition is usually quite accurate but there are transcription errors that can and very often do occur. I apologize for any typographical errors that were not detected and corrected.  Jomarie Longs, MD 05/08/2018, 12:07 PM

## 2018-06-19 ENCOUNTER — Encounter: Payer: Self-pay | Admitting: Psychiatry

## 2018-06-19 ENCOUNTER — Other Ambulatory Visit: Payer: Self-pay

## 2018-06-19 ENCOUNTER — Ambulatory Visit: Payer: No Typology Code available for payment source | Admitting: Psychiatry

## 2018-06-19 VITALS — BP 143/88 | HR 71 | Temp 97.5°F | Ht 64.5 in | Wt 155.2 lb

## 2018-06-19 DIAGNOSIS — F410 Panic disorder [episodic paroxysmal anxiety] without agoraphobia: Secondary | ICD-10-CM | POA: Diagnosis not present

## 2018-06-19 DIAGNOSIS — F411 Generalized anxiety disorder: Secondary | ICD-10-CM

## 2018-06-19 DIAGNOSIS — F331 Major depressive disorder, recurrent, moderate: Secondary | ICD-10-CM

## 2018-06-19 DIAGNOSIS — F900 Attention-deficit hyperactivity disorder, predominantly inattentive type: Secondary | ICD-10-CM | POA: Diagnosis not present

## 2018-06-19 MED ORDER — CLONAZEPAM 0.5 MG PO TABS: 0.2500 mg | ORAL_TABLET | Freq: Every evening | ORAL | 2 refills | Status: DC | PRN

## 2018-06-19 MED ORDER — TRAZODONE HCL 50 MG PO TABS: 75.0000 mg | ORAL_TABLET | Freq: Every evening | ORAL | 1 refills | Status: DC | PRN

## 2018-06-19 MED ORDER — AMPHETAMINE-DEXTROAMPHETAMINE 20 MG PO TABS: 20.0000 mg | ORAL_TABLET | Freq: Every day | ORAL | 0 refills | Status: DC

## 2018-06-19 MED ORDER — CITALOPRAM HYDROBROMIDE 40 MG PO TABS: 40.0000 mg | ORAL_TABLET | Freq: Every day | ORAL | 2 refills | Status: DC

## 2018-06-19 NOTE — Progress Notes (Signed)
BH MD OP Progress Note  06/19/2018 12:29 PM [name]  MRN:  [id number]  Chief Complaint: ' I am here for follow up." Chief Complaint    Follow-up; Medication Refill     HPI: Dana Benson is a 57 year old Caucasian female who is divorced, employed, lives in [place], presented to the clinic today for a follow-up visit.  Patient today reports she is busy with work since 2 of her coworkers are on leave of absence.  She reports she hence has a lot to do and she is overwhelmed with the same.  She reports her anxiety and depressive symptoms as possibly making some progress on the additional dosage of BuSpar.  She reports when she does not take it one day she notices a big difference.  She hence believes it is working.  She denies any side effects to the medication.  Patient reports sleep is improved however there are nights when she is restless.  She reports last night she did not sleep that good.  She takes trazodone 50 mg at bedtime.  Discussed increasing it to 75 mg.  Patient continues to have support system from her sister.  Her sister also struggles with mental health problems.  Patient continues to be compliant with Adderall which she states is working well.  Patient with elevated blood pressure today discussed the effects of stimulants on her BP.   Discussed her referral for CBT again.  Discussed with her that we do have a therapist here in clinic who may be able to see her.  She agrees with plan.  Patient denies any suicidality, homicidality or perceptual disturbances. Visit Diagnosis:    ICD-10-CM   1. MDD (major depressive disorder), recurrent episode, moderate (HCC) F33.1 amphetamine-dextroamphetamine (ADDERALL) 20 MG tablet    traZODone (DESYREL) 50 MG tablet    citalopram (CELEXA) 40 MG tablet  2. Attention deficit hyperactivity disorder (ADHD), predominantly inattentive type F90.0 traZODone (DESYREL) 50 MG tablet    citalopram (CELEXA) 40 MG tablet  3. Panic attack F41.0  clonazePAM (KLONOPIN) 0.5 MG tablet  4. GAD (generalized anxiety disorder) F41.1     Past Psychiatric History: I have reviewed past psychiatric history from my progress note on 01/08/2018.  Past trials of Effexor, Paxil, Wellbutrin, Prozac, Lexapro, Cymbalta.  Past Medical History:  Past Medical History:  Diagnosis Date  . ADD (attention deficit disorder with hyperactivity)   . Allergic rhinitis   . Anxiety   . Depression   . GERD (gastroesophageal reflux disease)   . History of chicken pox     Past Surgical History:  Procedure Laterality Date  . APPENDECTOMY  1979  . BLADDER SUSPENSION  1995   during hysterectomy  . CHOLECYSTECTOMY  1985  . TONSILLECTOMY  1970  . VAGINAL HYSTERECTOMY  1995    Family Psychiatric History: I have reviewed family psychiatric history from my progress note on 01/08/2018  Family History:  Family History  Problem Relation Age of Onset  . Depression Mother   . Hypertension Mother   . Diabetes Mother   . Depression Sister   . Anxiety disorder Sister   . ADD / ADHD Sister   . Depression Son   . Depression Maternal Aunt   . Breast cancer Maternal Aunt 44  . Breast cancer Maternal Grandmother 60  . Breast cancer Cousin 44  . Breast cancer Maternal Aunt 20    Social History:  Social History   Socioeconomic History  . Marital status: Single    Spouse name: Not  on file  . Number of children: 3  . Years of education: Not on file  . Highest education level: Not on file  Occupational History    Employer: [employer]  Social Needs  . Financial resource strain: Not on file  . Food insecurity:    Worry: Not on file    Inability: Not on file  . Transportation needs:    Medical: Not on file    Non-medical: Not on file  Tobacco Use  . Smoking status: Current Every Day Smoker    Packs/day: 0.50    Years: 20.00    Pack years: 10.00    Types: Cigarettes  . Smokeless tobacco: Never Used  . Tobacco comment: 2 packs a week  Substance and Sexual  Activity  . Alcohol use: Yes    Alcohol/week: 1.0 - 2.0 standard drinks    Types: 1 - 2 Glasses of wine per week    Comment: Wine HS occasional  . Drug use: No  . Sexual activity: Never  Lifestyle  . Physical activity:    Days per week: Not on file    Minutes per session: Not on file  . Stress: Not on file  Relationships  . Social connections:    Talks on phone: Not on file    Gets together: Not on file    Attends religious service: Not on file    Active member of club or organization: Not on file    Attends meetings of clubs or organizations: Not on file    Relationship status: Not on file  Other Topics Concern  . Not on file  Social History Narrative   Lives in [place]. Works at [employer].    Allergies:  Allergies  Allergen Reactions  . Prozac [Fluoxetine Hcl] Other (See Comments)    GI ISSUES  . Trazodone And Nefazodone     nightmares  . Wellbutrin [Bupropion] Other (See Comments)    Dizziness and ? Hearing loss  . Prednisone Anxiety    Metabolic Disorder Labs: Lab Results  Component Value Date   HGBA1C 5.8 (H) 03/02/2018   MPG 119.76 03/02/2018   No results found for: PROLACTIN Lab Results  Component Value Date   CHOL 225 (H) 03/02/2018   TRIG 85 03/02/2018   HDL 56 03/02/2018   CHOLHDL 4.0 03/02/2018   VLDL 17 03/02/2018   LDLCALC 152 (H) 03/02/2018   LDLCALC 146 (H) 06/28/2015   Lab Results  Component Value Date   TSH 1.159 03/02/2018   TSH 1.12 06/28/2015    Therapeutic Level Labs: No results found for: LITHIUM No results found for: VALPROATE No components found for:  CBMZ  Current Medications: Current Outpatient Medications  Medication Sig Dispense Refill  . amphetamine-dextroamphetamine (ADDERALL) 20 MG tablet Take 1 tablet (20 mg total) by mouth daily. 30 tablet 0  . amphetamine-dextroamphetamine (ADDERALL) 20 MG tablet Take by mouth.    Melene Muller ON 07/06/2018] amphetamine-dextroamphetamine (ADDERALL) 20 MG tablet Take 1 tablet (20 mg  total) by mouth daily. 30 tablet 0  . busPIRone (BUSPAR) 10 MG tablet Take 1 tablet (10 mg total) by mouth 3 (three) times daily. 90 tablet 2  . cetirizine (ZYRTEC) 10 MG tablet Take 10 mg by mouth daily.    . citalopram (CELEXA) 40 MG tablet Take 1 tablet (40 mg total) by mouth daily. 30 tablet 2  . [START ON 06/30/2018] clonazePAM (KLONOPIN) 0.5 MG tablet Take 0.5-1 tablets (0.25-0.5 mg total) by mouth at bedtime as needed for anxiety. 26  tablet 2  . cyanocobalamin (,VITAMIN B-12,) 1000 MCG/ML injection Inject 1 mL (1,000 mcg total) into the muscle every 30 (thirty) days. 1 mL 11  . cyanocobalamin (,VITAMIN B-12,) 1000 MCG/ML injection Inject 1 mL (1,000 mcg total) into the muscle once. 1 mL 0  . fluticasone (FLONASE) 50 MCG/ACT nasal spray Place 2 sprays into both nostrils daily. 16 g 11  . Linaclotide (LINZESS) 290 MCG CAPS capsule Take 1 capsule (290 mcg total) by mouth daily. 30 capsule 6  . ondansetron (ZOFRAN) 8 MG tablet Take 1 tablet (8 mg total) by mouth every 8 (eight) hours as needed for nausea or vomiting. 30 tablet 3  . traZODone (DESYREL) 50 MG tablet Take 1.5 tablets (75 mg total) by mouth at bedtime as needed for sleep. 45 tablet 1   Current Facility-Administered Medications  Medication Dose Route Frequency Provider Last Rate Last Dose  . cyanocobalamin ((VITAMIN B-12)) injection 1,000 mcg  1,000 mcg Intramuscular Once Faythe GheeFisher, Susan W, PA-C         Musculoskeletal: Strength & Muscle Tone: within normal limits Gait & Station: normal Patient leans: N/A  Psychiatric Specialty Exam: Review of Systems  Psychiatric/Behavioral: The patient is nervous/anxious and has insomnia.   All other systems reviewed and are negative.   Blood pressure (!) 143/88, pulse 71, temperature (!) 97.5 F (36.4 C), temperature source Oral, height 5' 4.5" (1.638 m), weight 155 lb 3.2 oz (70.4 kg).Body mass index is 26.23 kg/m.  General Appearance: Casual  Eye Contact:  Fair  Speech:  Clear and  Coherent  Volume:  Normal  Mood:  Anxious and Dysphoric some improvement  Affect:  Congruent  Thought Process:  Goal Directed and Descriptions of Associations: Intact  Orientation:  Full (Time, Place, and Person)  Thought Content: Logical   Suicidal Thoughts:  No  Homicidal Thoughts:  No  Memory:  Immediate;   Fair Recent;   Fair Remote;   Fair  Judgement:  Fair  Insight:  Fair  Psychomotor Activity:  Normal  Concentration:  Concentration: Fair and Attention Span: Fair  Recall:  FiservFair  Fund of Knowledge: Fair  Language: Fair  Akathisia:  No  Handed:  Right  AIMS (if indicated): na  Assets:  Communication Skills Desire for Improvement Social Support  ADL's:  Intact  Cognition: WNL  Sleep:  restless at times   Screenings: PHQ2-9     [facility] Visit from 03/13/2016 in [facility] Office Visit from 08/20/2012 in [facility]  PHQ-2 Total Score  0  0       Assessment and Plan: [name] is a 57 year old Caucasian female who has a history of depression, ADHD, presented to the clinic today for a follow-up visit.  Patient reports she continues to struggle with some anxiety and sleep problems.  Discussed referral for CBT again and also discussed medication changes.  Plan as noted below.  Plan For depression Continue Celexa 40 mg p.o. daily BuSpar 10 mg p.o. 3 times daily Refer for CBT with therapist Adelina MingsKelsey.  Insomnia Increase trazodone to 75 mg p.o. nightly as needed  For anxiety symptoms Continue Celexa and BuSpar as prescribed Discussed to limit the use of Klonopin and will change to 0.25-0.5 mg p.o. nightly as needed  For ADHD Continue Adderall 20 mg p.o. daily Gave her prescription to be filled on or after after 07/06/2018 Reviewed [place] controlled substance database.  Follow-up in clinic in 6-7 weeks or sooner if needed.  More than 50 % of the  time was spent for psychoeducation and supportive psychotherapy and care coordination. This  note was generated in part or whole with voice recognition software. Voice recognition is usually quite accurate but there are transcription errors that can and very often do occur. I apologize for any typographical errors that were not detected and corrected.         Jomarie Longs, MD 06/19/2018, 12:29 PM

## 2018-08-04 ENCOUNTER — Ambulatory Visit: Payer: No Typology Code available for payment source | Admitting: Psychiatry

## 2018-08-04 ENCOUNTER — Encounter: Payer: Self-pay | Admitting: Psychiatry

## 2018-08-04 DIAGNOSIS — F900 Attention-deficit hyperactivity disorder, predominantly inattentive type: Secondary | ICD-10-CM | POA: Diagnosis not present

## 2018-08-04 DIAGNOSIS — F410 Panic disorder [episodic paroxysmal anxiety] without agoraphobia: Secondary | ICD-10-CM

## 2018-08-04 DIAGNOSIS — F331 Major depressive disorder, recurrent, moderate: Secondary | ICD-10-CM | POA: Diagnosis not present

## 2018-08-04 DIAGNOSIS — F411 Generalized anxiety disorder: Secondary | ICD-10-CM | POA: Diagnosis not present

## 2018-08-04 MED ORDER — TRAZODONE HCL 100 MG PO TABS: 100.0000 mg | ORAL_TABLET | Freq: Every day | ORAL | 0 refills | Status: DC

## 2018-08-04 MED ORDER — AMPHETAMINE-DEXTROAMPHETAMINE 15 MG PO TABS: 15.0000 mg | ORAL_TABLET | Freq: Two times a day (BID) | ORAL | 0 refills | Status: DC

## 2018-08-04 MED ORDER — BUSPIRONE HCL 10 MG PO TABS: 20.0000 mg | ORAL_TABLET | Freq: Two times a day (BID) | ORAL | 0 refills | Status: DC

## 2018-08-04 NOTE — Progress Notes (Signed)
BH MD OP Progress Note  08/04/2018 4:12 PM NALLA PURDY  MRN:  [id number]  Chief Complaint:  Chief Complaint    Follow-up     HPI: Dana Benson is a 57 year old Caucasian female who is divorced, employed, lives in [place], presented to the clinic today for a follow-up visit.  Patient today reports she continues to struggle with some attention and concentration problems.  She reports she has difficulty focusing at work.  She reports she is easily distracted and has been having trouble completing her work.  Patient also reports some depressive symptoms.  She reports it is always difficult this time of the year.  Patient reports she hence has been trying to stay with her girlfriend and her parents as much as possible.  Patient reports she continues to struggle with sleep.  She reports she would like to try a higher dosage of trazodone.  Patient reports she was unable to make the appointment for her therapy since she struggles financially and is not able to afford her co-pay.  Patient denies any suicidality.  Discussed medication readjustment with patient.  She agrees with plan.  She denies any other concerns today. Visit Diagnosis:    ICD-10-CM   1. MDD (major depressive disorder), recurrent episode, moderate (HCC) F33.1   2. Attention deficit hyperactivity disorder (ADHD), predominantly inattentive type F90.0   3. Panic attack F41.0   4. GAD (generalized anxiety disorder) F41.1     Past Psychiatric History: Have reviewed past psychiatric history from my progress note on 01/08/2018.  Past trials of Effexor, Paxil, Wellbutrin, Prozac, Lexapro, Cymbalta  Past Medical History:  Past Medical History:  Diagnosis Date  . ADD (attention deficit disorder with hyperactivity)   . Allergic rhinitis   . Anxiety   . Depression   . GERD (gastroesophageal reflux disease)   . History of chicken pox     Past Surgical History:  Procedure Laterality Date  . APPENDECTOMY  1979  . BLADDER SUSPENSION   1995   during hysterectomy  . CHOLECYSTECTOMY  1985  . TONSILLECTOMY  1970  . VAGINAL HYSTERECTOMY  1995    Family Psychiatric History: Reviewed family psychiatric history from my progress note on 01/08/2018  Family History:  Family History  Problem Relation Age of Onset  . Depression Mother   . Hypertension Mother   . Diabetes Mother   . Depression Sister   . Anxiety disorder Sister   . ADD / ADHD Sister   . Depression Son   . Depression Maternal Aunt   . Breast cancer Maternal Aunt 44  . Breast cancer Maternal Grandmother 60  . Breast cancer Cousin 44  . Breast cancer Maternal Aunt 42    Social History: I have reviewed social history from my progress note on 01/08/2018 Social History   Socioeconomic History  . Marital status: Single    Spouse name: Not on file  . Number of children: 3  . Years of education: Not on file  . Highest education level: Not on file  Occupational History    Employer: [employer]  Social Needs  . Financial resource strain: Not on file  . Food insecurity:    Worry: Not on file    Inability: Not on file  . Transportation needs:    Medical: Not on file    Non-medical: Not on file  Tobacco Use  . Smoking status: Current Every Day Smoker    Packs/day: 0.50    Years: 20.00    Pack years: 10.00  Types: Cigarettes  . Smokeless tobacco: Never Used  . Tobacco comment: 2 packs a week  Substance and Sexual Activity  . Alcohol use: Yes    Alcohol/week: 1.0 - 2.0 standard drinks    Types: 1 - 2 Glasses of wine per week    Comment: Wine HS occasional  . Drug use: No  . Sexual activity: Never  Lifestyle  . Physical activity:    Days per week: Not on file    Minutes per session: Not on file  . Stress: Not on file  Relationships  . Social connections:    Talks on phone: Not on file    Gets together: Not on file    Attends religious service: Not on file    Active member of club or organization: Not on file    Attends meetings of clubs or  organizations: Not on file    Relationship status: Not on file  Other Topics Concern  . Not on file  Social History Narrative   Lives in [place]. Works at [employer].    Allergies:  Allergies  Allergen Reactions  . Prozac [Fluoxetine Hcl] Other (See Comments)    GI ISSUES  . Trazodone And Nefazodone     nightmares  . Wellbutrin [Bupropion] Other (See Comments)    Dizziness and ? Hearing loss  . Prednisone Anxiety    Metabolic Disorder Labs: Lab Results  Component Value Date   HGBA1C 5.8 (H) 03/02/2018   MPG 119.76 03/02/2018   No results found for: PROLACTIN Lab Results  Component Value Date   CHOL 225 (H) 03/02/2018   TRIG 85 03/02/2018   HDL 56 03/02/2018   CHOLHDL 4.0 03/02/2018   VLDL 17 03/02/2018   LDLCALC 152 (H) 03/02/2018   LDLCALC 146 (H) 06/28/2015   Lab Results  Component Value Date   TSH 1.159 03/02/2018   TSH 1.12 06/28/2015    Therapeutic Level Labs: No results found for: LITHIUM No results found for: VALPROATE No components found for:  CBMZ  Current Medications: Current Outpatient Medications  Medication Sig Dispense Refill  . amphetamine-dextroamphetamine (ADDERALL) 15 MG tablet Take 1 tablet by mouth 2 (two) times daily. 60 tablet 0  . amphetamine-dextroamphetamine (ADDERALL) 20 MG tablet Take 1 tablet (20 mg total) by mouth daily. 30 tablet 0  . amphetamine-dextroamphetamine (ADDERALL) 20 MG tablet Take by mouth.    Marland Kitchen amphetamine-dextroamphetamine (ADDERALL) 20 MG tablet Take 1 tablet (20 mg total) by mouth daily. 30 tablet 0  . busPIRone (BUSPAR) 10 MG tablet Take 2 tablets (20 mg total) by mouth 2 (two) times daily. 360 tablet 0  . cetirizine (ZYRTEC) 10 MG tablet Take 10 mg by mouth daily.    . citalopram (CELEXA) 40 MG tablet Take 1 tablet (40 mg total) by mouth daily. 30 tablet 2  . clonazePAM (KLONOPIN) 0.5 MG tablet Take 0.5-1 tablets (0.25-0.5 mg total) by mouth at bedtime as needed for anxiety. 26 tablet 2  . cyanocobalamin (,VITAMIN  B-12,) 1000 MCG/ML injection Inject 1 mL (1,000 mcg total) into the muscle every 30 (thirty) days. 1 mL 11  . cyanocobalamin (,VITAMIN B-12,) 1000 MCG/ML injection Inject 1 mL (1,000 mcg total) into the muscle once. 1 mL 0  . fluticasone (FLONASE) 50 MCG/ACT nasal spray Place 2 sprays into both nostrils daily. 16 g 11  . Linaclotide (LINZESS) 290 MCG CAPS capsule Take 1 capsule (290 mcg total) by mouth daily. 30 capsule 6  . ondansetron (ZOFRAN) 8 MG tablet Take 1 tablet (8  mg total) by mouth every 8 (eight) hours as needed for nausea or vomiting. 30 tablet 3  . pantoprazole (PROTONIX) 40 MG tablet   5  . traZODone (DESYREL) 100 MG tablet Take 1 tablet (100 mg total) by mouth at bedtime. 90 tablet 0   Current Facility-Administered Medications  Medication Dose Route Frequency Provider Last Rate Last Dose  . cyanocobalamin ((VITAMIN B-12)) injection 1,000 mcg  1,000 mcg Intramuscular Once Faythe Ghee, PA-C         Musculoskeletal: Strength & Muscle Tone: within normal limits Gait & Station: normal Patient leans: N/A  Psychiatric Specialty Exam: Review of Systems  Psychiatric/Behavioral: The patient is nervous/anxious and has insomnia.   All other systems reviewed and are negative.   PATIENT DECLINED VITAL SIGNS .There is no height or weight on file to calculate BMI - Patient declined  General Appearance: Casual  Eye Contact:  Good  Speech:  Clear and Coherent  Volume:  Normal  Mood:  Depressed  Affect:  Congruent  Thought Process:  Goal Directed and Descriptions of Associations: Intact  Orientation:  Full (Time, Place, and Person)  Thought Content: Logical   Suicidal Thoughts:  No  Homicidal Thoughts:  No  Memory:  Immediate;   Fair Recent;   Fair Remote;   Fair  Judgement:  Fair  Insight:  Fair  Psychomotor Activity:  Normal  Concentration:  Concentration: Fair and Attention Span: Fair  Recall:  [employer] of Knowledge: Fair  Language: Fair  Akathisia:  No  Handed:   Right  AIMS (if indicated): na  Assets:  Communication Skills Desire for Improvement Social Support  ADL's:  Intact  Cognition: WNL  Sleep:  Poor   Screenings: PHQ2-9     Office Visit from 03/13/2016 in [facility] Office Visit from 08/20/2012 in [facility]  PHQ-2 Total Score  0  0       Assessment and Plan: Denine is a 57 year old Caucasian female who has a history of depression and ADHD, presented to the clinic today for a follow-up visit.  Patient continues to struggle with mood symptoms as well as sleep problems.  She also has been struggling with some attention and concentration problems recently.  Discussed the following medication changes with patient.  Patient has been unable to start psychotherapy sessions due to financial problems.  Plan as noted below.  Plan For depression Continue Celexa 40 mg p.o. daily Increase BuSpar to 20 mg p.o. twice daily   For insomnia Increase trazodone to 100 mg p.o. nightly as needed  For anxiety symptoms Celexa and BuSpar as prescribed Klonopin 0.25 mg p.o. nightly as needed.  She reports she has been able to cut to half a tablet every night now.  For ADHD Increase Adderall to 15 mg p.o. twice daily. I have reviewed [place] controlled substance database.  Follow-up in clinic in 4-5 weeks or sooner if needed  More than 50 % of the time was spent for psychoeducation and supportive psychotherapy and care coordination.  This note was generated in part or whole with voice recognition software. Voice recognition is usually quite accurate but there are transcription errors that can and very often do occur. I apologize for any typographical errors that were not detected and corrected.         Jomarie Longs, MD 08/04/2018, 4:12 PM

## 2018-09-03 ENCOUNTER — Other Ambulatory Visit: Payer: Self-pay | Admitting: Psychiatry

## 2018-09-04 NOTE — Telephone Encounter (Signed)
Sent Adderall to pharmacy. Reviewed [facility]

## 2018-09-10 ENCOUNTER — Other Ambulatory Visit: Payer: Self-pay

## 2018-09-10 ENCOUNTER — Encounter: Payer: Self-pay | Admitting: Psychiatry

## 2018-09-10 ENCOUNTER — Ambulatory Visit: Payer: No Typology Code available for payment source | Admitting: Psychiatry

## 2018-09-10 VITALS — BP 122/84 | HR 88 | Wt 158.8 lb

## 2018-09-10 DIAGNOSIS — F331 Major depressive disorder, recurrent, moderate: Secondary | ICD-10-CM | POA: Diagnosis not present

## 2018-09-10 DIAGNOSIS — F900 Attention-deficit hyperactivity disorder, predominantly inattentive type: Secondary | ICD-10-CM | POA: Diagnosis not present

## 2018-09-10 DIAGNOSIS — F411 Generalized anxiety disorder: Secondary | ICD-10-CM

## 2018-09-10 DIAGNOSIS — F410 Panic disorder [episodic paroxysmal anxiety] without agoraphobia: Secondary | ICD-10-CM | POA: Diagnosis not present

## 2018-09-10 MED ORDER — AMPHETAMINE-DEXTROAMPHETAMINE 20 MG PO TABS: 20.0000 mg | ORAL_TABLET | Freq: Two times a day (BID) | ORAL | 0 refills | Status: DC

## 2018-09-10 MED ORDER — CITALOPRAM HYDROBROMIDE 40 MG PO TABS: 40.0000 mg | ORAL_TABLET | Freq: Every day | ORAL | 2 refills | Status: DC

## 2018-09-10 MED ORDER — TRAZODONE HCL 50 MG PO TABS: 25.0000 mg | ORAL_TABLET | Freq: Every evening | ORAL | 1 refills | Status: DC | PRN

## 2018-09-10 NOTE — Progress Notes (Signed)
BH MD OP Progress Note  09/10/2018 4:31 PM MAYERLI KIRST  MRN:  [id number]  Chief Complaint: ' I am here for follow up." Chief Complaint    Follow-up     HPI:  Dana Benson is a 57 year old Caucasian female who is divorced, employed, lives in [place], presented to the clinic today for a follow-up visit.   She today reports that she continues to struggle with attention and concentration problems at work.  She reports she has difficulty completing projects and assignments on time.  She reports she used to be on Adderall 20 mg twice a day which worked for her in the past.  She denies any side effects to the Adderall.  She reports she is compliant on the Adderall as prescribed.  Patient reports she is compliant on her other medications as prescribed.  She continues to struggle with some anxiety and depressive symptoms on and off however overall is doing well at this time.  She was able to reach out to the TMS contact person.  She reports she has to schedule an appointment for consultation to decide when she can start TMS therapy.  Patient reports sleep continues to be restless.  She reports her sleep is improved on the higher dose of trazodone however she continues to wake up in the middle of the night.  Discussed increasing her dosage of trazodone to 125 mg and she agrees with plan.  Patient denies any suicidality.  Patient denies any perceptual disturbances.       Visit Diagnosis:    ICD-10-CM   1. MDD (major depressive disorder), recurrent episode, moderate (HCC) F33.1 citalopram (CELEXA) 40 MG tablet  2. Attention deficit hyperactivity disorder (ADHD), predominantly inattentive type F90.0 citalopram (CELEXA) 40 MG tablet  3. Panic attack F41.0   4. GAD (generalized anxiety disorder) F41.1     Past Psychiatric History: I have reviewed past psychiatric history from my progress note on 01/08/2018.  Past trials of Effexor, Paxil, Wellbutrin, Prozac, Lexapro, Cymbalta.     Past Medical  History:  Past Medical History:  Diagnosis Date  . ADD (attention deficit disorder with hyperactivity)   . Allergic rhinitis   . Anxiety   . Depression   . GERD (gastroesophageal reflux disease)   . History of chicken pox     Past Surgical History:  Procedure Laterality Date  . APPENDECTOMY  1979  . BLADDER SUSPENSION  1995   during hysterectomy  . CHOLECYSTECTOMY  1985  . TONSILLECTOMY  1970  . VAGINAL HYSTERECTOMY  1995    Family Psychiatric History: Reviewed family psychiatric history from my progress note on 01/08/2018  Family History:  Family History  Problem Relation Age of Onset  . Depression Mother   . Hypertension Mother   . Diabetes Mother   . Depression Sister   . Anxiety disorder Sister   . ADD / ADHD Sister   . Depression Son   . Depression Maternal Aunt   . Breast cancer Maternal Aunt 44  . Breast cancer Maternal Grandmother 60  . Breast cancer Cousin 44  . Breast cancer Maternal Aunt 40    Social History: I have reviewed social history from my progress note on 01/08/2018 Social History   Socioeconomic History  . Marital status: Single    Spouse name: Not on file  . Number of children: 3  . Years of education: Not on file  . Highest education level: Not on file  Occupational History    Employer: [employer]  Social Needs  .  Financial resource strain: Not on file  . Food insecurity:    Worry: Not on file    Inability: Not on file  . Transportation needs:    Medical: Not on file    Non-medical: Not on file  Tobacco Use  . Smoking status: Current Every Day Smoker    Packs/day: 0.50    Years: 20.00    Pack years: 10.00    Types: Cigarettes  . Smokeless tobacco: Never Used  . Tobacco comment: 2 packs a week  Substance and Sexual Activity  . Alcohol use: Yes    Alcohol/week: 1.0 - 2.0 standard drinks    Types: 1 - 2 Glasses of wine per week    Comment: Wine HS occasional  . Drug use: No  . Sexual activity: Never  Lifestyle  . Physical activity:     Days per week: Not on file    Minutes per session: Not on file  . Stress: Not on file  Relationships  . Social connections:    Talks on phone: Not on file    Gets together: Not on file    Attends religious service: Not on file    Active member of club or organization: Not on file    Attends meetings of clubs or organizations: Not on file    Relationship status: Not on file  Other Topics Concern  . Not on file  Social History Narrative   Lives in [place]. Works at [employer].    Allergies:  Allergies  Allergen Reactions  . Prozac [Fluoxetine Hcl] Other (See Comments)    GI ISSUES  . Trazodone And Nefazodone     nightmares  . Wellbutrin [Bupropion] Other (See Comments)    Dizziness and ? Hearing loss  . Prednisone Anxiety    Metabolic Disorder Labs: Lab Results  Component Value Date   HGBA1C 5.8 (H) 03/02/2018   MPG 119.76 03/02/2018   No results found for: PROLACTIN Lab Results  Component Value Date   CHOL 225 (H) 03/02/2018   TRIG 85 03/02/2018   HDL 56 03/02/2018   CHOLHDL 4.0 03/02/2018   VLDL 17 03/02/2018   LDLCALC 152 (H) 03/02/2018   LDLCALC 146 (H) 06/28/2015   Lab Results  Component Value Date   TSH 1.159 03/02/2018   TSH 1.12 06/28/2015    Therapeutic Level Labs: No results found for: LITHIUM No results found for: VALPROATE No components found for:  CBMZ  Current Medications: Current Outpatient Medications  Medication Sig Dispense Refill  . busPIRone (BUSPAR) 10 MG tablet Take 2 tablets (20 mg total) by mouth 2 (two) times daily. 360 tablet 0  . cetirizine (ZYRTEC) 10 MG tablet Take 10 mg by mouth daily.    . citalopram (CELEXA) 40 MG tablet Take 1 tablet (40 mg total) by mouth daily. 90 tablet 2  . clonazePAM (KLONOPIN) 0.5 MG tablet Take 0.5-1 tablets (0.25-0.5 mg total) by mouth at bedtime as needed for anxiety. 26 tablet 2  . cyanocobalamin (,VITAMIN B-12,) 1000 MCG/ML injection Inject 1 mL (1,000 mcg total) into the muscle every 30  (thirty) days. 1 mL 11  . cyanocobalamin (,VITAMIN B-12,) 1000 MCG/ML injection Inject 1 mL (1,000 mcg total) into the muscle once. 1 mL 0  . fluticasone (FLONASE) 50 MCG/ACT nasal spray Place 2 sprays into both nostrils daily. 16 g 11  . Linaclotide (LINZESS) 290 MCG CAPS capsule Take 1 capsule (290 mcg total) by mouth daily. 30 capsule 6  . ondansetron (ZOFRAN) 8 MG tablet Take  1 tablet (8 mg total) by mouth every 8 (eight) hours as needed for nausea or vomiting. 30 tablet 3  . pantoprazole (PROTONIX) 40 MG tablet   5  . traZODone (DESYREL) 100 MG tablet Take 1 tablet (100 mg total) by mouth at bedtime. 90 tablet 0  . [START ON 10/02/2018] amphetamine-dextroamphetamine (ADDERALL) 20 MG tablet Take 1 tablet (20 mg total) by mouth 2 (two) times daily. 60 tablet 0  . traZODone (DESYREL) 50 MG tablet Take 0.5 tablets (25 mg total) by mouth at bedtime as needed for sleep. To be combined with 100 mg 45 tablet 1   Current Facility-Administered Medications  Medication Dose Route Frequency Provider Last Rate Last Dose  . cyanocobalamin ((VITAMIN B-12)) injection 1,000 mcg  1,000 mcg Intramuscular Once Sherrie MustacheFisher, Roselyn BeringSusan W, PA-C         Musculoskeletal: Strength & Muscle Tone: within normal limits Gait & Station: normal Patient leans: N/A  Psychiatric Specialty Exam: Review of Systems  Psychiatric/Behavioral: The patient has insomnia.   All other systems reviewed and are negative.   Blood pressure 122/84, pulse 88, weight 158 lb 12.8 oz (72 kg).Body mass index is 26.84 kg/m.  General Appearance: Casual  Eye Contact:  Fair  Speech:  Clear and Coherent  Volume:  Normal  Mood:  Euthymic  Affect:  Congruent  Thought Process:  Goal Directed and Descriptions of Associations: Intact  Orientation:  Full (Time, Place, and Person)  Thought Content: Logical   Suicidal Thoughts:  No  Homicidal Thoughts:  No  Memory:  Immediate;   Fair Recent;   Fair Remote;   Fair  Judgement:  Fair  Insight:  Fair   Psychomotor Activity:  Normal  Concentration:  Concentration: Fair and Attention Span: Fair  Recall:  [employer]Fair  Fund of Knowledge: Fair  Language: Fair  Akathisia:  No  Handed:  Right  AIMS (if indicated): denies tremors, rigidity,stiffness  Assets:  Communication Skills Desire for Improvement Social Support  ADL's:  Intact  Cognition: WNL  Sleep:  restless   Screenings: PHQ2-9     Office Visit from 03/13/2016 in [facility] Office Visit from 08/20/2012 in [facility] [place]  PHQ-2 Total Score  0  0       Assessment and Plan: [name] is a 57 year old Caucasian female who has a history of depression, ADHD, presented to the clinic today for a follow-up visit.  Patient continues to struggle with attention and concentration problems as well as sleep issues.  We will continue to make medication changes.  Plan For depression Celexa 40 mg p.o. daily BuSpar 20 mg p.o. twice daily  For insomnia Increase trazodone to 125 mg p.o. nightly as needed  For anxiety symptoms Celexa and BuSpar as prescribed Klonopin 0.25 mg p.o. nightly as needed.  She reports she has been taking half a dose on and off.  She is aware about the risk of being on long-term benzodiazepine therapy.  For ADHD Increase Adderall to 20 mg p.o. twice daily I have reviewed [place] controlled substance database.  Patient has been referred for TMS therapy.  She will have to schedule an appointment to start therapy.  Follow-up in clinic in 6 to 8 weeks or sooner if needed.  More than 50 % of the time was spent for psychoeducation and supportive psychotherapy and care coordination.  This note was generated in part or whole with voice recognition software. Voice recognition is usually quite accurate but there are transcription errors that can and  very often do occur. I apologize for any typographical errors that were not detected and corrected.         Jomarie Longs, MD 09/10/2018, 4:31  PM

## 2018-11-10 ENCOUNTER — Ambulatory Visit: Payer: No Typology Code available for payment source | Admitting: Psychiatry

## 2018-11-10 ENCOUNTER — Other Ambulatory Visit: Payer: Self-pay

## 2018-11-10 ENCOUNTER — Encounter: Payer: Self-pay | Admitting: Psychiatry

## 2018-11-10 VITALS — BP 126/81 | HR 90 | Temp 98.1°F | Wt 162.0 lb

## 2018-11-10 DIAGNOSIS — F331 Major depressive disorder, recurrent, moderate: Secondary | ICD-10-CM

## 2018-11-10 DIAGNOSIS — F410 Panic disorder [episodic paroxysmal anxiety] without agoraphobia: Secondary | ICD-10-CM | POA: Diagnosis not present

## 2018-11-10 DIAGNOSIS — F900 Attention-deficit hyperactivity disorder, predominantly inattentive type: Secondary | ICD-10-CM | POA: Diagnosis not present

## 2018-11-10 MED ORDER — AMPHETAMINE-DEXTROAMPHETAMINE 20 MG PO TABS: 20.0000 mg | ORAL_TABLET | Freq: Two times a day (BID) | ORAL | 0 refills | Status: DC

## 2018-11-10 MED ORDER — CLONAZEPAM 0.5 MG PO TABS: 0.2500 mg | ORAL_TABLET | Freq: Every evening | ORAL | 1 refills | Status: DC | PRN

## 2018-11-10 MED ORDER — AMPHETAMINE-DEXTROAMPHETAMINE 20 MG PO TABS: 20.0000 mg | ORAL_TABLET | Freq: Every day | ORAL | 0 refills | Status: DC

## 2018-11-10 MED ORDER — TRAZODONE HCL 100 MG PO TABS: 100.0000 mg | ORAL_TABLET | Freq: Every day | ORAL | 2 refills | Status: DC

## 2018-11-10 MED ORDER — TRAZODONE HCL 50 MG PO TABS: 50.0000 mg | ORAL_TABLET | Freq: Every evening | ORAL | 2 refills | Status: DC | PRN

## 2018-11-10 MED ORDER — BUSPIRONE HCL 10 MG PO TABS: 20.0000 mg | ORAL_TABLET | Freq: Two times a day (BID) | ORAL | 0 refills | Status: DC

## 2018-11-10 NOTE — Progress Notes (Signed)
BH MD OP Progress Note  11/10/2018 5:24 PM Dana Benson  MRN:  [id number]  Chief Complaint: ' I am here for follow up." Chief Complaint    Follow-up; Medication Refill     HPI: Dana Benson is a 58 year old Caucasian female who is divorced, employed, lives in [place], presented to the clinic today for a follow-up visit.  Patient today reports that she is making some progress with regards to her mood symptoms.  She reports she continues to have times when she feels sad or anxious however she has been able to cope better.  She reports she has been compliant with her medications.  She denies any side effects.  Patient reports she continues to be compliant on her Adderall as prescribed.  She reports she is able to manage her work okay.  She does not want any medication changes today.  Discussed weaning off the Klonopin more.  Patient however reports she wants to wait and wants to wait until the winter months or over.  Patient reports she did not call TMS personal to know more about it even though she was referred for the same.  She reports her sister had a bad experience with it and she was afraid.  She however reports she does not think she needs it at this point since she is feeling better. Visit Diagnosis:    ICD-10-CM   1. MDD (major depressive disorder), recurrent episode, moderate (HCC) F33.1 busPIRone (BUSPAR) 10 MG tablet    traZODone (DESYREL) 100 MG tablet    traZODone (DESYREL) 50 MG tablet  2. Attention deficit hyperactivity disorder (ADHD), predominantly inattentive type F90.0 amphetamine-dextroamphetamine (ADDERALL) 20 MG tablet    amphetamine-dextroamphetamine (ADDERALL) 20 MG tablet    DISCONTINUED: amphetamine-dextroamphetamine (ADDERALL) 20 MG tablet  3. Panic attack F41.0 clonazePAM (KLONOPIN) 0.5 MG tablet    busPIRone (BUSPAR) 10 MG tablet    Past Psychiatric History: I have reviewed past psychiatric history from my progress note on 01/08/2018.  Past trials of Effexor,  Paxil, Wellbutrin, Prozac, Lexapro, Cymbalta.  Past Medical History:  Past Medical History:  Diagnosis Date  . ADD (attention deficit disorder with hyperactivity)   . Allergic rhinitis   . Anxiety   . Depression   . GERD (gastroesophageal reflux disease)   . History of chicken pox     Past Surgical History:  Procedure Laterality Date  . APPENDECTOMY  1979  . BLADDER SUSPENSION  1995   during hysterectomy  . CHOLECYSTECTOMY  1985  . TONSILLECTOMY  1970  . VAGINAL HYSTERECTOMY  1995    Family Psychiatric History: I have reviewed family psychiatric history from my progress note on 01/08/2018.  Family History:  Family History  Problem Relation Age of Onset  . Depression Mother   . Hypertension Mother   . Diabetes Mother   . Depression Sister   . Anxiety disorder Sister   . ADD / ADHD Sister   . Depression Son   . Depression Maternal Aunt   . Breast cancer Maternal Aunt 44  . Breast cancer Maternal Grandmother 60  . Breast cancer Cousin 44  . Breast cancer Maternal Aunt [id number]    Social History: I have reviewed social history from my progress note on 01/08/2018. Social History   Socioeconomic History  . Marital status: Single    Spouse name: Not on file  . Number of children: 3  . Years of education: Not on file  . Highest education level: Not on file  Occupational History  Employer: [employer]  Social Needs  . Financial resource strain: Not on file  . Food insecurity:    Worry: Not on file    Inability: Not on file  . Transportation needs:    Medical: Not on file    Non-medical: Not on file  Tobacco Use  . Smoking status: Current Every Day Smoker    Packs/day: 0.50    Years: 20.00    Pack years: 10.00    Types: Cigarettes  . Smokeless tobacco: Never Used  . Tobacco comment: 2 packs a week  Substance and Sexual Activity  . Alcohol use: Yes    Alcohol/week: 1.0 - 2.0 standard drinks    Types: 1 - 2 Glasses of wine per week    Comment: Wine HS occasional  .  Drug use: No  . Sexual activity: Never  Lifestyle  . Physical activity:    Days per week: Not on file    Minutes per session: Not on file  . Stress: Not on file  Relationships  . Social connections:    Talks on phone: Not on file    Gets together: Not on file    Attends religious service: Not on file    Active member of club or organization: Not on file    Attends meetings of clubs or organizations: Not on file    Relationship status: Not on file  Other Topics Concern  . Not on file  Social History Narrative   Lives in [place]. Works at [employer].    Allergies:  Allergies  Allergen Reactions  . Prozac [Fluoxetine Hcl] Other (See Comments)    GI ISSUES  . Trazodone And Nefazodone     nightmares  . Wellbutrin [Bupropion] Other (See Comments)    Dizziness and ? Hearing loss  . Prednisone Anxiety    Metabolic Disorder Labs: Lab Results  Component Value Date   HGBA1C 5.8 (H) 03/02/2018   MPG 119.76 03/02/2018   No results found for: PROLACTIN Lab Results  Component Value Date   CHOL 225 (H) 03/02/2018   TRIG 85 03/02/2018   HDL 56 03/02/2018   CHOLHDL 4.0 03/02/2018   VLDL 17 03/02/2018   LDLCALC 152 (H) 03/02/2018   LDLCALC 146 (H) 06/28/2015   Lab Results  Component Value Date   TSH 1.159 03/02/2018   TSH 1.12 06/28/2015    Therapeutic Level Labs: No results found for: LITHIUM No results found for: VALPROATE No components found for:  CBMZ  Current Medications: Current Outpatient Medications  Medication Sig Dispense Refill  . amphetamine-dextroamphetamine (ADDERALL) 20 MG tablet Take 1 tablet (20 mg total) by mouth 2 (two) times daily. 60 tablet 0  . busPIRone (BUSPAR) 10 MG tablet Take 2 tablets (20 mg total) by mouth 2 (two) times daily. 360 tablet 0  . cetirizine (ZYRTEC) 10 MG tablet Take 10 mg by mouth daily.    . citalopram (CELEXA) 40 MG tablet Take 1 tablet (40 mg total) by mouth daily. 90 tablet 2  . [START ON 11/20/2018] clonazePAM (KLONOPIN) 0.5  MG tablet Take 0.5-1 tablets (0.25-0.5 mg total) by mouth at bedtime as needed for anxiety. 26 tablet 1  . cyanocobalamin (,VITAMIN B-12,) 1000 MCG/ML injection Inject 1 mL (1,000 mcg total) into the muscle every 30 (thirty) days. 1 mL 11  . cyanocobalamin (,VITAMIN B-12,) 1000 MCG/ML injection Inject 1 mL (1,000 mcg total) into the muscle once. 1 mL 0  . fluticasone (FLONASE) 50 MCG/ACT nasal spray Place 2 sprays into both nostrils  daily. 16 g 11  . Linaclotide (LINZESS) 290 MCG CAPS capsule Take 1 capsule (290 mcg total) by mouth daily. 30 capsule 6  . ondansetron (ZOFRAN) 8 MG tablet Take 1 tablet (8 mg total) by mouth every 8 (eight) hours as needed for nausea or vomiting. 30 tablet 3  . pantoprazole (PROTONIX) 40 MG tablet   5  . traZODone (DESYREL) 100 MG tablet Take 1 tablet (100 mg total) by mouth at bedtime. 90 tablet 2  . traZODone (DESYREL) 50 MG tablet Take 1 tablet (50 mg total) by mouth at bedtime as needed for sleep. To be combined with 100 mg 90 tablet 2  . [START ON 12/09/2018] amphetamine-dextroamphetamine (ADDERALL) 20 MG tablet Take 1 tablet (20 mg total) by mouth 2 (two) times daily. 60 tablet 0   Current Facility-Administered Medications  Medication Dose Route Frequency Provider Last Rate Last Dose  . cyanocobalamin ((VITAMIN B-12)) injection 1,000 mcg  1,000 mcg Intramuscular Once Sherrie Mustache Roselyn Bering, PA-C         Musculoskeletal: Strength & Muscle Tone: within normal limits Gait & Station: normal Patient leans: N/A  Psychiatric Specialty Exam: Review of Systems  Psychiatric/Behavioral: Positive for depression (improving).  All other systems reviewed and are negative.   Blood pressure 126/81, pulse 90, temperature 98.1 F (36.7 C), temperature source Oral, weight 162 lb (73.5 kg).Body mass index is 27.38 kg/m.  General Appearance: Casual  Eye Contact:  Fair  Speech:  Clear and Coherent  Volume:  Normal  Mood:  Dysphoric improving  Affect:  Congruent  Thought  Process:  Goal Directed and Descriptions of Associations: Intact  Orientation:  Full (Time, Place, and Person)  Thought Content: Logical   Suicidal Thoughts:  No  Homicidal Thoughts:  No  Memory:  Immediate;   Fair Recent;   Fair Remote;   Fair  Judgement:  Fair  Insight:  Fair  Psychomotor Activity:  Normal  Concentration:  Concentration: Fair and Attention Span: Fair  Recall:  [employer] of Knowledge: Fair  Language: Fair  Akathisia:  No  Handed:  Right  AIMS (if indicated): Denies tremors, rigidity, stiffness  Assets:  Communication Skills Desire for Improvement Social Support  ADL's:  Intact  Cognition: WNL  Sleep:  Fair   Screenings: PHQ2-9     Office Visit from 03/13/2016 in [facility] Office Visit from 08/20/2012 in [facility]  PHQ-2 Total Score  0  0       Assessment and Plan: Kaylyn is a 58 yr old patient female who has a history of depression, ADHD, presented to the clinic today for a follow-up visit.  Patient reports making some progress on the current medication regimen with regards to her mood and concentration.  We will continue plan as noted below.  Plan For depression- improving Celexa 40 mg p.o. daily BuSpar 20 mg p.o. twice daily  For insomnia-improving Trazodone 150 mg p.o. nightly as needed  For anxiety symptoms-improving Celexa and BuSpar as prescribed Klonopin 0.25 mg p.o. daily PRN.  For ADHD-improving Adderall 20 mg p.o. twice daily I have reviewed  controlled substance database.  Patient prescribed 2 scripts with date specified last 1 to be filled on or after 12/09/2018.  Patient declines psychotherapy referral.  Patient declined TMS therapy referral.  Follow-up in clinic in 2 months or sooner if needed.  I have spent atleast 15 minutes  face to face with patient today. More than 50 % of the time was spent for  psychoeducation and supportive psychotherapy and care coordination.  This note was  generated in part or whole with voice recognition software. Voice recognition is usually quite accurate but there are transcription errors that can and very often do occur. I apologize for any typographical errors that were not detected and corrected.         Jomarie LongsSaramma Jahmya Onofrio, MD 11/10/2018, 5:24 PM

## 2019-01-27 ENCOUNTER — Other Ambulatory Visit: Payer: Self-pay | Admitting: Psychiatry

## 2019-01-27 DIAGNOSIS — F900 Attention-deficit hyperactivity disorder, predominantly inattentive type: Secondary | ICD-10-CM

## 2019-01-28 MED ORDER — AMPHETAMINE-DEXTROAMPHETAMINE 20 MG PO TABS: 20.0000 mg | ORAL_TABLET | Freq: Two times a day (BID) | ORAL | 0 refills | Status: DC

## 2019-01-28 NOTE — Telephone Encounter (Signed)
Sent adderall to pharmacy 

## 2019-03-04 ENCOUNTER — Other Ambulatory Visit: Payer: Self-pay | Admitting: Physician Assistant

## 2019-03-04 DIAGNOSIS — Z1231 Encounter for screening mammogram for malignant neoplasm of breast: Secondary | ICD-10-CM

## 2019-03-19 ENCOUNTER — Telehealth: Payer: Self-pay

## 2019-03-19 DIAGNOSIS — F410 Panic disorder [episodic paroxysmal anxiety] without agoraphobia: Secondary | ICD-10-CM

## 2019-03-19 DIAGNOSIS — F331 Major depressive disorder, recurrent, moderate: Secondary | ICD-10-CM

## 2019-03-19 DIAGNOSIS — F900 Attention-deficit hyperactivity disorder, predominantly inattentive type: Secondary | ICD-10-CM

## 2019-03-19 MED ORDER — BUSPIRONE HCL 10 MG PO TABS: 20.0000 mg | ORAL_TABLET | Freq: Two times a day (BID) | ORAL | 0 refills | Status: DC

## 2019-03-19 MED ORDER — CLONAZEPAM 0.5 MG PO TABS: 0.2500 mg | ORAL_TABLET | Freq: Every evening | ORAL | 1 refills | Status: DC | PRN

## 2019-03-19 MED ORDER — AMPHETAMINE-DEXTROAMPHETAMINE 20 MG PO TABS: 20.0000 mg | ORAL_TABLET | Freq: Two times a day (BID) | ORAL | 0 refills | Status: DC

## 2019-03-19 NOTE — Telephone Encounter (Signed)
Sent adderall, buspar, klonopin to pharmacy.

## 2019-03-19 NOTE — Telephone Encounter (Signed)
pt needs refills on adderall, buspirone and klonopin

## 2019-03-25 ENCOUNTER — Encounter: Payer: Self-pay | Admitting: Psychiatry

## 2019-03-25 ENCOUNTER — Ambulatory Visit (INDEPENDENT_AMBULATORY_CARE_PROVIDER_SITE_OTHER): Payer: No Typology Code available for payment source | Admitting: Psychiatry

## 2019-03-25 ENCOUNTER — Other Ambulatory Visit: Payer: Self-pay

## 2019-03-25 DIAGNOSIS — F900 Attention-deficit hyperactivity disorder, predominantly inattentive type: Secondary | ICD-10-CM

## 2019-03-25 DIAGNOSIS — F331 Major depressive disorder, recurrent, moderate: Secondary | ICD-10-CM | POA: Diagnosis not present

## 2019-03-25 DIAGNOSIS — F410 Panic disorder [episodic paroxysmal anxiety] without agoraphobia: Secondary | ICD-10-CM | POA: Diagnosis not present

## 2019-03-25 MED ORDER — AMPHETAMINE-DEXTROAMPHETAMINE 20 MG PO TABS: 20.0000 mg | ORAL_TABLET | Freq: Two times a day (BID) | ORAL | 0 refills | Status: DC

## 2019-03-25 NOTE — Progress Notes (Signed)
Virtual Visit via Telephone Note  I connected with Dana Benson on 03/25/19 at  8:45 AM EDT by telephone and verified that I am speaking with the correct person using two identifiers.   I discussed the limitations, risks, security and privacy concerns of performing an evaluation and management service by telephone and the availability of in person appointments. I also discussed with the patient that there may be a patient responsible charge related to this service. The patient expressed understanding and agreed to proceed.    I discussed the assessment and treatment plan with the patient. The patient was provided an opportunity to ask questions and all were answered. The patient agreed with the plan and demonstrated an understanding of the instructions.   The patient was advised to call back or seek an in-person evaluation if the symptoms worsen or if the condition fails to improve as anticipated.   BH MD OP Progress Note  03/25/2019 1:44 PM Dana Benson  MRN:  [id number]  Chief Complaint:  Chief Complaint    Follow-up     HPI: Dana Benson is a 58 year old Caucasian female who is divorced, employed, lives in [place], was evaluated by phone today.  Patient was offered video call however declined.  Patient today reports she continues to have ups and downs however is able to cope better than before.  She reports she currently has a pay cut at work since she only works every other week.  She has been getting unemployment the other time.  So far she is okay.  She reports she is happy that she is going to be a grandmother for the very first time.  She looks forward to seeing the baby soon.  Patient reports she is compliant on her medications as prescribed.  She denies any side effects.  She wants to stay on the same dosage.  Patient continues to be compliant on her Adderall.  Patient reports she is not interested in psychotherapy sessions at this time when it was discussed she can be  referred given the current psychosocial stressors of the COVID-19 and change in the work schedule.   She denies any suicidality, homicidality or perceptual disturbances.    Visit Diagnosis:    ICD-10-CM   1. MDD (major depressive disorder), recurrent episode, moderate (HCC)  F33.1   2. Attention deficit hyperactivity disorder (ADHD), predominantly inattentive type  F90.0 amphetamine-dextroamphetamine (ADDERALL) 20 MG tablet  3. Panic attack  F41.0     Past Psychiatric History: I have reviewed past psychiatric history from my progress note on 01/08/2018.  Past trials of Effexor, Paxil, Wellbutrin, Prozac, Lexapro, Cymbalta.  Past Medical History:  Past Medical History:  Diagnosis Date  . ADD (attention deficit disorder with hyperactivity)   . Allergic rhinitis   . Anxiety   . Depression   . GERD (gastroesophageal reflux disease)   . History of chicken pox     Past Surgical History:  Procedure Laterality Date  . APPENDECTOMY  1979  . BLADDER SUSPENSION  1995   during hysterectomy  . CHOLECYSTECTOMY  1985  . TONSILLECTOMY  1970  . VAGINAL HYSTERECTOMY  1995    Family Psychiatric History: Reviewed family psychiatric history from my progress note on 01/08/2018.  Family History:  Family History  Problem Relation Age of Onset  . Depression Mother   . Hypertension Mother   . Diabetes Mother   . Depression Sister   . Anxiety disorder Sister   . ADD / ADHD Sister   . Depression  Son   . Depression Maternal Aunt   . Breast cancer Maternal Aunt 44  . Breast cancer Maternal Grandmother 60  . Breast cancer Cousin 44  . Breast cancer Maternal Aunt [id number]    Social History: Reviewed social history from my progress note on 01/08/2018. Social History   Socioeconomic History  . Marital status: Single    Spouse name: Not on file  . Number of children: 3  . Years of education: Not on file  . Highest education level: Not on file  Occupational History    Employer: [employer]  Social Needs   . Financial resource strain: Not on file  . Food insecurity    Worry: Not on file    Inability: Not on file  . Transportation needs    Medical: Not on file    Non-medical: Not on file  Tobacco Use  . Smoking status: Current Every Day Smoker    Packs/day: 0.50    Years: 20.00    Pack years: 10.00    Types: Cigarettes  . Smokeless tobacco: Never Used  . Tobacco comment: 2 packs a week  Substance and Sexual Activity  . Alcohol use: Yes    Alcohol/week: 1.0 - 2.0 standard drinks    Types: 1 - 2 Glasses of wine per week    Comment: Wine HS occasional  . Drug use: No  . Sexual activity: Never  Lifestyle  . Physical activity    Days per week: Not on file    Minutes per session: Not on file  . Stress: Not on file  Relationships  . Social Musicianconnections    Talks on phone: Not on file    Gets together: Not on file    Attends religious service: Not on file    Active member of club or organization: Not on file    Attends meetings of clubs or organizations: Not on file    Relationship status: Not on file  Other Topics Concern  . Not on file  Social History Narrative   [place] in [place]. Works at [employer].    Allergies:  Allergies  Allergen Reactions  . Prozac [Fluoxetine Hcl] Other (See Comments)    GI ISSUES  . Trazodone And Nefazodone     nightmares  . Wellbutrin [Bupropion] Other (See Comments)    Dizziness and ? Hearing loss  . Prednisone Anxiety    Metabolic Disorder Labs: Lab Results  Component Value Date   HGBA1C 5.8 (H) 03/02/2018   MPG 119.76 03/02/2018   No results found for: PROLACTIN Lab Results  Component Value Date   CHOL 225 (H) 03/02/2018   TRIG 85 03/02/2018   HDL 56 03/02/2018   CHOLHDL 4.0 03/02/2018   VLDL 17 03/02/2018   LDLCALC 152 (H) 03/02/2018   LDLCALC 146 (H) 06/28/2015   Lab Results  Component Value Date   TSH 1.159 03/02/2018   TSH 1.12 06/28/2015    Therapeutic Level Labs: No results found for: LITHIUM No results found for:  VALPROATE No components found for:  CBMZ  Current Medications: Current Outpatient Medications  Medication Sig Dispense Refill  . amphetamine-dextroamphetamine (ADDERALL) 20 MG tablet Take 1 tablet (20 mg total) by mouth 2 (two) times daily. 60 tablet 0  . [START ON 04/16/2019] amphetamine-dextroamphetamine (ADDERALL) 20 MG tablet Take 1 tablet (20 mg total) by mouth 2 (two) times daily. 60 tablet 0  . busPIRone (BUSPAR) 10 MG tablet Take 2 tablets (20 mg total) by mouth 2 (two) times daily. 360 tablet  0  . cetirizine (ZYRTEC) 10 MG tablet Take 10 mg by mouth daily.    . citalopram (CELEXA) 40 MG tablet Take 1 tablet (40 mg total) by mouth daily. 90 tablet 2  . clonazePAM (KLONOPIN) 0.5 MG tablet Take 0.5-1 tablets (0.25-0.5 mg total) by mouth at bedtime as needed for anxiety. 26 tablet 1  . cyanocobalamin (,VITAMIN B-12,) 1000 MCG/ML injection Inject 1 mL (1,000 mcg total) into the muscle every 30 (thirty) days. 1 mL 11  . cyanocobalamin (,VITAMIN B-12,) 1000 MCG/ML injection Inject 1 mL (1,000 mcg total) into the muscle once. 1 mL 0  . fluticasone (FLONASE) 50 MCG/ACT nasal spray Place 2 sprays into both nostrils daily. 16 g 11  . Linaclotide (LINZESS) 290 MCG CAPS capsule Take 1 capsule (290 mcg total) by mouth daily. 30 capsule 6  . ondansetron (ZOFRAN) 8 MG tablet Take 1 tablet (8 mg total) by mouth every 8 (eight) hours as needed for nausea or vomiting. 30 tablet 3  . pantoprazole (PROTONIX) 40 MG tablet   5  . traZODone (DESYREL) 100 MG tablet Take 1 tablet (100 mg total) by mouth at bedtime. 90 tablet 2  . traZODone (DESYREL) 50 MG tablet Take 1 tablet (50 mg total) by mouth at bedtime as needed for sleep. To be combined with 100 mg 90 tablet 2   Current Facility-Administered Medications  Medication Dose Route Frequency Provider Last Rate Last Dose  . cyanocobalamin ((VITAMIN B-12)) injection 1,000 mcg  1,000 mcg Intramuscular Once Faythe GheeFisher, Susan W, PA-C          Musculoskeletal: Strength & Muscle Tone: UTA Gait & Station: UTA Patient leans: N/A  Psychiatric Specialty Exam: Review of Systems  Psychiatric/Behavioral: The patient is not nervous/anxious.   All other systems reviewed and are negative.   There were no vitals taken for this visit.There is no height or weight on file to calculate BMI.  General Appearance: UTA  Eye Contact:  UTA  Speech:  Clear and Coherent  Volume:  Normal  Mood:  Euthymic  Affect:  UTA  Thought Process:  Goal Directed and Descriptions of Associations: Intact  Orientation:  Full (Time, Place, and Person)  Thought Content: Logical   Suicidal Thoughts:  No  Homicidal Thoughts:  No  Memory:  Immediate;   Fair Recent;   Fair Remote;   Fair  Judgement:  Fair  Insight:  Fair  Psychomotor Activity:  UTA  Concentration:  Concentration: Fair and Attention Span: Fair  Recall:  [employer]Fair  Fund of Knowledge: Fair  Language: Fair  Akathisia:  No  Handed:  Right  AIMS (if indicated): Denies tremors, rigidity, stiffness  Assets:  Communication Skills Desire for Improvement Housing Talents/Skills Transportation  ADL's:  Intact  Cognition: WNL  Sleep:  Fair   Screenings: PHQ2-9     Office Visit from 03/13/2016 in [facility] [place] Office Visit from 08/20/2012 in [facility]   PHQ-2 Total Score  0  0       Assessment and Plan: Dana Benson is a 58 year old Caucasian female who has a history of depression, ADHD was evaluated by phone today.  Patient was offered video called however declined.  Patient continues to make progress on the current medication regimen.  She does have psychosocial stressors of the current COVID-19 outbreak however is coping okay.  Plan as noted below.  Plan For depression-stable Celexa 40 mg p.o. daily BuSpar 20 mg p.o. twice daily  For insomnia-improving Trazodone 150 mg p.o. nightly as needed  For anxiety disorder-improving Celexa and BuSpar as  prescribed Klonopin 0.25 mg p.o. daily as needed  For ADHD-improving Adderall 20 mg p.o. twice daily.  I have reviewed [place] controlled substance database. I have provided her with 1 prescription with date specified  Patient was referred for psychotherapy sessions again-declined.  Follow-up in clinic in 2 to 3 months or sooner if needed.  August 27 at 8:45 AM  I have spent atleast 15 minutes non face to face with patient today. More than 50 % of the time was spent for psychoeducation and supportive psychotherapy and care coordination.  This note was generated in part or whole with voice recognition software. Voice recognition is usually quite accurate but there are transcription errors that can and very often do occur. I apologize for any typographical errors that were not detected and corrected.          Ursula Alert, MD 03/25/2019, 1:44 PM

## 2019-05-27 ENCOUNTER — Encounter: Payer: Self-pay | Admitting: Psychiatry

## 2019-05-27 ENCOUNTER — Ambulatory Visit (INDEPENDENT_AMBULATORY_CARE_PROVIDER_SITE_OTHER): Payer: No Typology Code available for payment source | Admitting: Psychiatry

## 2019-05-27 ENCOUNTER — Other Ambulatory Visit: Payer: Self-pay

## 2019-05-27 ENCOUNTER — Telehealth: Payer: Self-pay

## 2019-05-27 DIAGNOSIS — F900 Attention-deficit hyperactivity disorder, predominantly inattentive type: Secondary | ICD-10-CM | POA: Diagnosis not present

## 2019-05-27 DIAGNOSIS — F410 Panic disorder [episodic paroxysmal anxiety] without agoraphobia: Secondary | ICD-10-CM | POA: Diagnosis not present

## 2019-05-27 DIAGNOSIS — F331 Major depressive disorder, recurrent, moderate: Secondary | ICD-10-CM

## 2019-05-27 MED ORDER — VORTIOXETINE HBR 5 MG PO TABS: 10.0000 mg | ORAL_TABLET | Freq: Every day | ORAL | 1 refills | Status: DC

## 2019-05-27 MED ORDER — BUSPIRONE HCL 10 MG PO TABS: 20.0000 mg | ORAL_TABLET | Freq: Two times a day (BID) | ORAL | 0 refills | Status: DC

## 2019-05-27 MED ORDER — CITALOPRAM HYDROBROMIDE 40 MG PO TABS: 20.0000 mg | ORAL_TABLET | Freq: Every day | ORAL | 2 refills | Status: DC

## 2019-05-27 MED ORDER — VORTIOXETINE HBR 10 MG PO TABS: 10.0000 mg | ORAL_TABLET | Freq: Every day | ORAL | 1 refills | Status: DC

## 2019-05-27 MED ORDER — AMPHETAMINE-DEXTROAMPHETAMINE 20 MG PO TABS: 20.0000 mg | ORAL_TABLET | Freq: Two times a day (BID) | ORAL | 0 refills | Status: DC

## 2019-05-27 MED ORDER — CLONAZEPAM 0.5 MG PO TABS: 0.2500 mg | ORAL_TABLET | Freq: Every evening | ORAL | 2 refills | Status: DC | PRN

## 2019-05-27 NOTE — Progress Notes (Signed)
Virtual Visit via Telephone Note  I connected with Dana Benson on 05/27/19 at  8:45 AM EDT by telephone and verified that I am speaking with the correct person using two identifiers.   I discussed the limitations, risks, security and privacy concerns of performing an evaluation and management service by telephone and the availability of in person appointments. I also discussed with the patient that there may be a patient responsible charge related to this service. The patient expressed understanding and agreed to proceed.   I discussed the assessment and treatment plan with the patient. The patient was provided an opportunity to ask questions and all were answered. The patient agreed with the plan and demonstrated an understanding of the instructions.   The patient was advised to call back or seek an in-person evaluation if the symptoms worsen or if the condition fails to improve as anticipated.  BH MD OP Progress Note  05/27/2019 12:01 PM Dana Benson  MRN:  [id number]  Chief Complaint:  Chief Complaint    Follow-up     HPI: Dana Benson is a 58 year old Caucasian female who is divorced, employed, lives in [place] was evaluated by phone today.  Patient was offered a video call however declined.  Patient today reports she continues to have ups and downs and has been struggling with some depressive symptoms recently.  She reports she struggles with lack of motivation ,hypersomnia during the day, anhedonia and sad mood.  She reports she is currently going through a lot of psychosocial stressors.  She reports that she is currently on a pay cut at her work.  She reports this is due to the current pandemic and being on reduced hours.  This is frustrating for her.  She reports she has been taking out her savings to manage herself.  She has not started psychotherapy sessions in spite of referring her out several times in the past.  She however agrees to be referred for therapy sessions again  today.  Patient reports she wants to change her Celexa to a new medication if possible.  She has been on the Celexa for a long time.  She is interested in Trintellix.  Patient reports she wants to continue taking the Adderall as prescribed.  She denies any side effects.  She continues to take the Klonopin as needed for anxiety symptoms.  She is aware that she has to limit use.  Patient denies any suicidality, homicidality or perceptual disturbances.  Patient denies any other concerns today. Visit Diagnosis:    ICD-10-CM   1. MDD (major depressive disorder), recurrent episode, moderate (HCC)  F33.1 busPIRone (BUSPAR) 10 MG tablet    DISCONTINUED: vortioxetine HBr (TRINTELLIX) 5 MG TABS tablet  2. Attention deficit hyperactivity disorder (ADHD), predominantly inattentive type  F90.0 citalopram (CELEXA) 40 MG tablet    amphetamine-dextroamphetamine (ADDERALL) 20 MG tablet  3. Panic attack  F41.0 busPIRone (BUSPAR) 10 MG tablet    clonazePAM (KLONOPIN) 0.5 MG tablet    Past Psychiatric History: I have reviewed past psychiatric history from my progress note on 01/08/2018.  Past trials of Effexor, Paxil, Wellbutrin, Prozac, Lexapro, Cymbalta, Celexa  Past Medical History:  Past Medical History:  Diagnosis Date  . ADD (attention deficit disorder with hyperactivity)   . Allergic rhinitis   . Anxiety   . Depression   . GERD (gastroesophageal reflux disease)   . History of chicken pox     Past Surgical History:  Procedure Laterality Date  . APPENDECTOMY  1979  .  BLADDER SUSPENSION  1995   during hysterectomy  . CHOLECYSTECTOMY  1985  . TONSILLECTOMY  1970  . VAGINAL HYSTERECTOMY  1995    Family Psychiatric History: I have reviewed family psychiatric history from my progress note on 01/08/2018. Family History:  Family History  Problem Relation Age of Onset  . Depression Mother   . Hypertension Mother   . Diabetes Mother   . Depression Sister   . Anxiety disorder Sister   . ADD  / ADHD Sister   . Depression Son   . Depression Maternal Aunt   . Breast cancer Maternal Aunt 44  . Breast cancer Maternal Grandmother 60  . Breast cancer Cousin 44  . Breast cancer Maternal Aunt [id number]    Social History: I have reviewed social history from my progress note on 01/08/2018. Social History   Socioeconomic History  . Marital status: Single    Spouse name: Not on file  . Number of children: 3  . Years of education: Not on file  . Highest education level: Not on file  Occupational History    Employer: [employer]  Social Needs  . Financial resource strain: Not on file  . Food insecurity    Worry: Not on file    Inability: Not on file  . Transportation needs    Medical: Not on file    Non-medical: Not on file  Tobacco Use  . Smoking status: Current Every Day Smoker    Packs/day: 0.50    Years: 20.00    Pack years: 10.00    Types: Cigarettes  . Smokeless tobacco: Never Used  . Tobacco comment: 2 packs a week  Substance and Sexual Activity  . Alcohol use: Yes    Alcohol/week: 1.0 - 2.0 standard drinks    Types: 1 - 2 Glasses of wine per week    Comment: Wine HS occasional  . Drug use: No  . Sexual activity: Never  Lifestyle  . Physical activity    Days per week: Not on file    Minutes per session: Not on file  . Stress: Not on file  Relationships  . Social Musicianconnections    Talks on phone: Not on file    Gets together: Not on file    Attends religious service: Not on file    Active member of club or organization: Not on file    Attends meetings of clubs or organizations: Not on file    Relationship status: Not on file  Other Topics Concern  . Not on file  Social History Narrative   [place] in [place]. Works at [employer].    Allergies:  Allergies  Allergen Reactions  . Prozac [Fluoxetine Hcl] Other (See Comments)    GI ISSUES  . Trazodone And Nefazodone     nightmares  . Wellbutrin [Bupropion] Other (See Comments)    Dizziness and ? Hearing loss  .  Prednisone Anxiety    Metabolic Disorder Labs: Lab Results  Component Value Date   HGBA1C 5.8 (H) 03/02/2018   MPG 119.76 03/02/2018   No results found for: PROLACTIN Lab Results  Component Value Date   CHOL 225 (H) 03/02/2018   TRIG 85 03/02/2018   HDL 56 03/02/2018   CHOLHDL 4.0 03/02/2018   VLDL 17 03/02/2018   LDLCALC 152 (H) 03/02/2018   LDLCALC 146 (H) 06/28/2015   Lab Results  Component Value Date   TSH 1.159 03/02/2018   TSH 1.12 06/28/2015    Therapeutic Level Labs: No results found for:  LITHIUM No results found for: VALPROATE No components found for:  CBMZ  Current Medications: Current Outpatient Medications  Medication Sig Dispense Refill  . amphetamine-dextroamphetamine (ADDERALL) 20 MG tablet Take 1 tablet (20 mg total) by mouth 2 (two) times daily. 60 tablet 0  . amphetamine-dextroamphetamine (ADDERALL) 20 MG tablet Take 1 tablet (20 mg total) by mouth 2 (two) times daily. 60 tablet 0  . busPIRone (BUSPAR) 10 MG tablet Take 2 tablets (20 mg total) by mouth 2 (two) times daily. 360 tablet 0  . cetirizine (ZYRTEC) 10 MG tablet Take 10 mg by mouth daily.    . citalopram (CELEXA) 40 MG tablet Take 0.5 tablets (20 mg total) by mouth daily. Patient being weaned off 90 tablet 2  . clonazePAM (KLONOPIN) 0.5 MG tablet Take 0.5-1 tablets (0.25-0.5 mg total) by mouth at bedtime as needed for anxiety. 26 tablet 2  . cyanocobalamin (,VITAMIN B-12,) 1000 MCG/ML injection Inject 1 mL (1,000 mcg total) into the muscle every 30 (thirty) days. 1 mL 11  . cyanocobalamin (,VITAMIN B-12,) 1000 MCG/ML injection Inject 1 mL (1,000 mcg total) into the muscle once. 1 mL 0  . fluticasone (FLONASE) 50 MCG/ACT nasal spray Place 2 sprays into both nostrils daily. 16 g 11  . Linaclotide (LINZESS) 290 MCG CAPS capsule Take 1 capsule (290 mcg total) by mouth daily. 30 capsule 6  . ondansetron (ZOFRAN) 8 MG tablet Take 1 tablet (8 mg total) by mouth every 8 (eight) hours as needed for nausea  or vomiting. 30 tablet 3  . pantoprazole (PROTONIX) 40 MG tablet   5  . traZODone (DESYREL) 100 MG tablet Take 1 tablet (100 mg total) by mouth at bedtime. 90 tablet 2  . traZODone (DESYREL) 50 MG tablet Take 1 tablet (50 mg total) by mouth at bedtime as needed for sleep. To be combined with 100 mg 90 tablet 2  . vortioxetine HBr (TRINTELLIX) 10 MG TABS tablet Take 1 tablet (10 mg total) by mouth daily. Take half tablet for 1 week and increase to 1 tablet 30 tablet 1   Current Facility-Administered Medications  Medication Dose Route Frequency Provider Last Rate Last Dose  . cyanocobalamin ((VITAMIN B-12)) injection 1,000 mcg  1,000 mcg Intramuscular Once Sherrie MustacheFisher, Roselyn BeringSusan W, PA-C         Musculoskeletal: Strength & Muscle Tone: UTA Gait & Station: Reports as WNL Patient leans: N/A  Psychiatric Specialty Exam: Review of Systems  Psychiatric/Behavioral: Positive for depression.  All other systems reviewed and are negative.   There were no vitals taken for this visit.There is no height or weight on file to calculate BMI.  General Appearance: UTA  Eye Contact:  UTA  Speech:  Clear and Coherent  Volume:  Normal  Mood:  Depressed  Affect:  UTA  Thought Process:  Goal Directed and Descriptions of Associations: Intact  Orientation:  Full (Time, Place, and Person)  Thought Content: Logical   Suicidal Thoughts:  No  Homicidal Thoughts:  No  Memory:  Immediate;   Fair Recent;   Fair Remote;   Fair  Judgement:  Fair  Insight:  Fair  Psychomotor Activity:  UTA  Concentration:  Concentration: Fair and Attention Span: Fair  Recall:  Fis[employer]Fair  Fund of Knowledge: Fair  Language: Fair  Akathisia:  No  Handed:  Right  AIMS (if indicated): Denies tremors, rigidity  Assets:  Communication Skills Desire for Improvement Housing Social Support  ADL's:  Intact  Cognition: WNL  Sleep:  Fair   Screenings:  PHQ2-9     Office Visit from 03/13/2016 in [facility] Office Visit  from 08/20/2012 in [place]  PHQ-2 Total Score  0  0       Assessment and Plan: Dana Benson is a 58 year old Caucasian female who has a history of depression, ADHD was evaluated by phone today.  Patient continues to struggle with depressive symptoms which are currently worsening.  She will benefit from medications as well as psychotherapy sessions.  Patient with history of noncompliance to therapy recommendations in the past.  Will refer her again.  Plan Depression-unstable Taper of Celexa. Start Trintellix 5 mg p.o. daily for 1 week and increase to 10 mg p.o. daily BuSpar 20 mg p.o. twice daily  Insomnia- improving Trazodone 150 mg p.o. nightly as needed  Anxiety disorder- some progress BuSpar as prescribed Klonopin 0.25 mg as needed daily-patient advised to limit use.  For ADHD- stable Adderall 20 mg p.o. twice daily I have reviewed [place] controlled substance database.  Tobacco use disorder- unstable Provided counseling.  Patient was referred for psychotherapy sessions again today.  I have sent a referral to our coordinator Ms. Lyndal Rainbow.  Follow-up in clinic in 3 weeks or sooner if needed.  September 17 at 10 AM  I have spent atleast 15 minutes non face to face with patient today. More than 50 % of the time was spent for psychoeducation and supportive psychotherapy and care coordination. This note was generated in part or whole with voice recognition software. Voice recognition is usually quite accurate but there are transcription errors that can and very often do occur. I apologize for any typographical errors that were not detected and corrected.      Ursula Alert, MD 05/27/2019, 12:01 PM

## 2019-05-27 NOTE — Telephone Encounter (Signed)
I have sent a 10 mg - 30 days - 1 tablet per day

## 2019-05-27 NOTE — Telephone Encounter (Signed)
  Pt insurance will only pay for 1 tablet per day.    vortioxetine HBr (TRINTELLIX) 5 MG TABS tablet Medication Date: 05/27/2019 Department: [facility] Ordering/Authorizing: Ursula Alert, MD  Order Providers  Prescribing Provider Encounter Provider  Ursula Alert, MD Ursula Alert, MD  Outpatient Medication Detail   Disp Refills Start End   vortioxetine HBr (TRINTELLIX) 5 MG TABS tablet 60 tablet 1 05/27/2019    Sig - Route: Take 2 tablets (10 mg total) by mouth daily. Start taking 1 tablet for 1 week and increase to 2 tablets after a week - Oral   Sent to pharmacy as: vortioxetine HBr (TRINTELLIX) 5 MG Tab tablet   E-Prescribing Status: Receipt confirmed by pharmacy (05/27/2019 9:01 AM EDT)   Associated Diagnoses  MDD (major depressive disorder), recurrent episode, moderate ([place]) - Primary

## 2019-06-02 ENCOUNTER — Telehealth: Payer: Self-pay

## 2019-06-02 NOTE — Telephone Encounter (Signed)
Returned call to [place] . Discussed to take 5 mg Trintellix for a week and increase to 10 mg. Celexa she is aware how to taper off - has written down instructions.

## 2019-06-02 NOTE — Telephone Encounter (Signed)
pt is confussed about how to take the celexa and trintellix

## 2019-06-17 ENCOUNTER — Encounter: Payer: Self-pay | Admitting: Psychiatry

## 2019-06-17 ENCOUNTER — Ambulatory Visit (INDEPENDENT_AMBULATORY_CARE_PROVIDER_SITE_OTHER): Payer: No Typology Code available for payment source | Admitting: Psychiatry

## 2019-06-17 ENCOUNTER — Other Ambulatory Visit: Payer: Self-pay

## 2019-06-17 DIAGNOSIS — F331 Major depressive disorder, recurrent, moderate: Secondary | ICD-10-CM

## 2019-06-17 DIAGNOSIS — F900 Attention-deficit hyperactivity disorder, predominantly inattentive type: Secondary | ICD-10-CM

## 2019-06-17 DIAGNOSIS — F410 Panic disorder [episodic paroxysmal anxiety] without agoraphobia: Secondary | ICD-10-CM

## 2019-06-17 MED ORDER — ARIPIPRAZOLE 2 MG PO TABS: 1.0000 mg | ORAL_TABLET | Freq: Every day | ORAL | 1 refills | Status: DC

## 2019-06-17 MED ORDER — AMPHETAMINE-DEXTROAMPHETAMINE 20 MG PO TABS: 20.0000 mg | ORAL_TABLET | Freq: Two times a day (BID) | ORAL | 0 refills | Status: DC

## 2019-06-17 MED ORDER — VORTIOXETINE HBR 20 MG PO TABS: 20.0000 mg | ORAL_TABLET | Freq: Every day | ORAL | 1 refills | Status: DC

## 2019-06-17 NOTE — Patient Instructions (Signed)
Aripiprazole tablets What is this medicine? ARIPIPRAZOLE (ay ri PIP ray zole) is an atypical antipsychotic. It is used to treat schizophrenia and bipolar disorder, also known as manic-depression. It is also used to treat Tourette's disorder and some symptoms of autism. This medicine may also be used in combination with antidepressants to treat major depressive disorder. This medicine may be used for other purposes; ask your health care provider or pharmacist if you have questions. COMMON BRAND NAME(S): Abilify What should I tell my health care provider before I take this medicine? They need to know if you have any of these conditions:  dehydration  dementia  diabetes  heart disease  history of stroke  low blood counts, like low white cell, platelet, or red cell counts  Parkinson's disease  seizures  suicidal thoughts, plans, or attempt; a previous suicide attempt by you or a family member  an unusual or allergic reaction to aripiprazole, other medicines, foods, dyes, or preservatives  pregnant or trying to get pregnant  breast-feeding How should I use this medicine? Take this medicine by mouth with a glass of water. Follow the directions on the prescription label. You can take this medicine with or without food. Take your doses at regular intervals. Do not take your medicine more often than directed. Do not stop taking except on the advice of your doctor or health care professional. A special MedGuide will be given to you by the pharmacist with each prescription and refill. Be sure to read this information carefully each time. Talk to your pediatrician regarding the use of this medicine in children. While this drug may be prescribed for children as young as 6 years of age for selected conditions, precautions do apply. Overdosage: If you think you have taken too much of this medicine contact a poison control center or emergency room at once. NOTE: This medicine is only for you. Do  not share this medicine with others. What if I miss a dose? If you miss a dose, take it as soon as you can. If it is almost time for your next dose, take only that dose. Do not take double or extra doses. What may interact with this medicine? Do not take this medicine with any of the following medications:  brexpiprazole  cisapride  dronedarone  metoclopramide  pimozide  thioridazine This medicine may also interact with the following medications:  alcohol  carbamazepine  certain medicines for anxiety or sleep  certain medicines for blood pressure  certain medicines for fungal infections like ketoconazole, fluconazole, posaconazole, and itraconazole  clarithromycin  dofetilide  fluoxetine  other medicines that prolong the QT interval (cause an abnormal heart rhythm)  paroxetine  quinidine  rifampin  ziprasidone This list may not describe all possible interactions. Give your health care provider a list of all the medicines, herbs, non-prescription drugs, or dietary supplements you use. Also tell them if you smoke, drink alcohol, or use illegal drugs. Some items may interact with your medicine. What should I watch for while using this medicine? Visit your doctor or health care professional for regular checks on your progress. It may be several weeks before you see the full effects of this medicine. Do not suddenly stop taking this medicine. You may need to gradually reduce the dose. Patients and their families should watch out for worsening depression or thoughts of suicide. Also watch out for sudden changes in feelings such as feeling anxious, agitated, panicky, irritable, hostile, aggressive, impulsive, severely restless, overly excited and hyperactive, or not   being able to sleep. If this happens, especially at the beginning of antidepressant treatment or after a change in dose, call your health care professional. You may get dizzy or drowsy. Do not drive, use  machinery, or do anything that needs mental alertness until you know how this medicine affects you. Do not stand or sit up quickly, especially if you are an older patient. This reduces the risk of dizzy or fainting spells. Alcohol can increase dizziness and drowsiness. Avoid alcoholic drinks. This medicine can reduce the response of your body to heat or cold. Dress warmly in cold weather and stay hydrated in hot weather. If possible, avoid extreme temperatures like saunas, hot tubs, very hot or cold showers, or activities that can cause dehydration such as vigorous exercise. This medicine may cause dry eyes and blurred vision. If you wear contact lenses, you may feel some discomfort. Lubricating drops may help. See your eye doctor if the problem does not go away or is severe. This medicine may increase blood sugar. Ask your healthcare provider if changes in diet or medicines are needed if you have diabetes. There have been reports of uncontrollable and strong urges to gamble, binge eat, shop, and have sex while taking this medicine. If you experience any of these or other uncontrollable and strong urges while taking this medicine, you should report it to your health care provider as soon as possible. What side effects may I notice from receiving this medicine? Side effects that you should report to your doctor or health care professional as soon as possible:  allergic reactions like skin rash, itching or hives, swelling of the face, lips, or tongue  breathing problems  confusion  feeling faint or lightheaded, falls  fever or chills, sore throat  joint pain  muscles pain, spasms  problems with balance, talking, walking  restlessness or need to keep moving  seizures  signs and symptoms of high blood sugar such as being more thirsty or hungry or having to urinate more than normal. You may also feel very tired or have blurry vision.  suicidal thoughts or other mood changes  trouble  swallowing  uncontrollable and excessive urges (examples: gambling, binge eating, shopping, having sex)  uncontrollable head, mouth, neck, arm, or leg movements  unusually weak or tired Side effects that usually do not require medical attention (report to your doctor or health care professional if they continue or are bothersome):  constipation  headache  nausea, vomiting  trouble sleeping  weight gain This list may not describe all possible side effects. Call your doctor for medical advice about side effects. You may report side effects to FDA at 1-800-FDA-1088. Where should I keep my medicine? Keep out of the reach of children. Store at room temperature between 15 and 30 degrees C (59 and 86 degrees F). Throw away any unused medicine after the expiration date. NOTE: This sheet is a summary. It may not cover all possible information. If you have questions about this medicine, talk to your doctor, pharmacist, or health care provider.  2020 Elsevier/Gold Standard (2018-11-11 11:09:04)  

## 2019-06-17 NOTE — Progress Notes (Signed)
Virtual Visit via Telephone Note  I connected with Dana Benson on 06/17/19 at 10:00 AM EDT by telephone and verified that I am speaking with the correct person using two identifiers.   I discussed the limitations, risks, security and privacy concerns of performing an evaluation and management service by telephone and the availability of in person appointments. I also discussed with the patient that there may be a patient responsible charge related to this service. The patient expressed understanding and agreed to proceed.   I discussed the assessment and treatment plan with the patient. The patient was provided an opportunity to ask questions and all were answered. The patient agreed with the plan and demonstrated an understanding of the instructions.   The patient was advised to call back or seek an in-person evaluation if the symptoms worsen or if the condition fails to improve as anticipated.   BH MD OP Progress Note  06/17/2019 12:20 PM Dana Benson  MRN:  [id number]  Chief Complaint:  Chief Complaint    Follow-up     HPI: Dana Benson is a 58 year old Caucasian female who is divorced, employed, lives in [place] was evaluated by phone today.  Patient preferred to do a phone call.  Patient today reports she has had some recent mood lability, crying spell, lack of energy.  She reports she is aware that it could be because she has stopped taking the Celexa and is currently on a new medication which needs time to build up in her system.  She reports she is tolerating the Trintellix well.  She does not think she has any side effects.  Patient denies any suicidality, homicidality or perceptual disturbances.  She reports sleep is good.  She continues to report stressors due to job related problems.  She is currently working on reduced hours and that is stressful for her.  She however has been coping okay.  Patient was referred for psychotherapy sessions multiple times previously however  she has been noncompliant and never follows through with recommendations.  Patient denies any other concerns today. Visit Diagnosis:    ICD-10-CM   1. MDD (major depressive disorder), recurrent episode, moderate (HCC)  F33.1 vortioxetine HBr (TRINTELLIX) 20 MG TABS tablet    ARIPiprazole (ABILIFY) 2 MG tablet  2. Attention deficit hyperactivity disorder (ADHD), predominantly inattentive type  F90.0 amphetamine-dextroamphetamine (ADDERALL) 20 MG tablet  3. Panic attack  F41.0 vortioxetine HBr (TRINTELLIX) 20 MG TABS tablet    Past Psychiatric History: I have reviewed past psychiatric history from my progress note on 01/08/2018.  Past trials of Effexor, Paxil, Wellbutrin, Prozac, Lexapro, Cymbalta, Celexa  Past Medical History:  Past Medical History:  Diagnosis Date  . ADD (attention deficit disorder with hyperactivity)   . Allergic rhinitis   . Anxiety   . Depression   . GERD (gastroesophageal reflux disease)   . History of chicken pox     Past Surgical History:  Procedure Laterality Date  . APPENDECTOMY  1979  . BLADDER SUSPENSION  1995   during hysterectomy  . CHOLECYSTECTOMY  1985  . TONSILLECTOMY  1970  . VAGINAL HYSTERECTOMY  1995    Family Psychiatric History: I have reviewed family psychiatric history from my progress note on 01/08/2018  Family History:  Family History  Problem Relation Age of Onset  . Depression Mother   . Hypertension Mother   . Diabetes Mother   . Depression Sister   . Anxiety disorder Sister   . ADD / ADHD Sister   .  Depression Son   . Depression Maternal Aunt   . Breast cancer Maternal Aunt 34  . Breast cancer Maternal Grandmother 60  . Breast cancer Cousin 58  . Breast cancer Maternal Aunt 56    Social History: I have reviewed social history from my progress note on 01/08/2018 Social History   Socioeconomic History  . Marital status: Single    Spouse name: Not on file  . Number of children: 3  . Years of education: Not on file  .  Highest education level: Not on file  Occupational History    Employer: [employer]  . Financial resource strain: Not on file  . Food insecurity    Worry: Not on file    Inability: Not on file  . Transportation needs    Medical: Not on file    Non-medical: Not on file  Tobacco Use  . Smoking status: Current Every Day Smoker    Packs/day: 0.50    Years: 20.00    Pack years: 10.00    Types: Cigarettes  . Smokeless tobacco: Never Used  . Tobacco comment: 2 packs a week  Substance and Sexual Activity  . Alcohol use: Yes    Alcohol/week: 1.0 - 2.0 standard drinks    Types: 1 - 2 Glasses of wine per week    Comment: Wine HS occasional  . Drug use: No  . Sexual activity: Never  Lifestyle  . Physical activity    Days per week: Not on file    Minutes per session: Not on file  . Stress: Not on file  Relationships  . Social Herbalist on phone: Not on file    Gets together: Not on file    Attends religious service: Not on file    Active member of club or organization: Not on file    Attends meetings of clubs or organizations: Not on file    Relationship status: Not on file  Other Topics Concern  . Not on file  Social History Narrative   Lives in [place]. Works at [employer].    Allergies:  Allergies  Allergen Reactions  . Prozac [Fluoxetine Hcl] Other (See Comments)    GI ISSUES  . Trazodone And Nefazodone     nightmares  . Wellbutrin [Bupropion] Other (See Comments)    Dizziness and ? Hearing loss  . Prednisone Anxiety    Metabolic Disorder Labs: Lab Results  Component Value Date   HGBA1C 5.8 (H) 03/02/2018   MPG 119.76 03/02/2018   No results found for: PROLACTIN Lab Results  Component Value Date   CHOL 225 (H) 03/02/2018   TRIG 85 03/02/2018   HDL 56 03/02/2018   CHOLHDL 4.0 03/02/2018   VLDL 17 03/02/2018   LDLCALC 152 (H) 03/02/2018   LDLCALC 146 (H) 06/28/2015   Lab Results  Component Value Date   TSH 1.159 03/02/2018   TSH 1.12  06/28/2015    Therapeutic Level Labs: No results found for: LITHIUM No results found for: VALPROATE No components found for:  CBMZ  Current Medications: Current Outpatient Medications  Medication Sig Dispense Refill  . amphetamine-dextroamphetamine (ADDERALL) 20 MG tablet Take 1 tablet (20 mg total) by mouth 2 (two) times daily. 60 tablet 0  . [START ON 06/25/2019] amphetamine-dextroamphetamine (ADDERALL) 20 MG tablet Take 1 tablet (20 mg total) by mouth 2 (two) times daily. 60 tablet 0  . ARIPiprazole (ABILIFY) 2 MG tablet Take 0.5 tablets (1 mg total) by mouth daily. 15 tablet  1  . busPIRone (BUSPAR) 10 MG tablet Take 2 tablets (20 mg total) by mouth 2 (two) times daily. 360 tablet 0  . cetirizine (ZYRTEC) 10 MG tablet Take 10 mg by mouth daily.    . clonazePAM (KLONOPIN) 0.5 MG tablet Take 0.5-1 tablets (0.25-0.5 mg total) by mouth at bedtime as needed for anxiety. 26 tablet 2  . cyanocobalamin (,VITAMIN B-12,) 1000 MCG/ML injection Inject 1 mL (1,000 mcg total) into the muscle every 30 (thirty) days. 1 mL 11  . cyanocobalamin (,VITAMIN B-12,) 1000 MCG/ML injection Inject 1 mL (1,000 mcg total) into the muscle once. 1 mL 0  . fluticasone (FLONASE) 50 MCG/ACT nasal spray Place 2 sprays into both nostrils daily. 16 g 11  . Linaclotide (LINZESS) 290 MCG CAPS capsule Take 1 capsule (290 mcg total) by mouth daily. 30 capsule 6  . ondansetron (ZOFRAN) 8 MG tablet Take 1 tablet (8 mg total) by mouth every 8 (eight) hours as needed for nausea or vomiting. 30 tablet 3  . pantoprazole (PROTONIX) 40 MG tablet   5  . traZODone (DESYREL) 100 MG tablet Take 1 tablet (100 mg total) by mouth at bedtime. 90 tablet 2  . traZODone (DESYREL) 50 MG tablet Take 1 tablet (50 mg total) by mouth at bedtime as needed for sleep. To be combined with 100 mg 90 tablet 2  . vortioxetine HBr (TRINTELLIX) 20 MG TABS tablet Take 1 tablet (20 mg total) by mouth daily. 30 tablet 1   Current Facility-Administered  Medications  Medication Dose Route Frequency Provider Last Rate Last Dose  . cyanocobalamin ((VITAMIN B-12)) injection 1,000 mcg  1,000 mcg Intramuscular Once Sherrie Mustache Roselyn Bering, PA-C         Musculoskeletal: Strength & Muscle Tone: UTA Gait & Station: Reports as WNL Patient leans: N/A  Psychiatric Specialty Exam: Review of Systems  Psychiatric/Behavioral: Positive for depression.  All other systems reviewed and are negative.   There were no vitals taken for this visit.There is no height or weight on file to calculate BMI.  General Appearance: UTA  Eye Contact:  UTA  Speech:  Clear and Coherent  Volume:  Normal  Mood:  Depressed  Affect:  UTA  Thought Process:  Goal Directed and Descriptions of Associations: Intact  Orientation:  NA  Thought Content: Logical   Suicidal Thoughts:  No  Homicidal Thoughts:  No  Memory:  Immediate;   Fair Recent;   Fair Remote;   Fair  Judgement:  Fair  Insight:  Fair  Psychomotor Activity:  UTA  Concentration:  Concentration: Fair and Attention Span: Fair  Recall:  [employer] of Knowledge: Fair  Language: Fair  Akathisia:  No  Handed:  Right  AIMS (if indicated):Denies tremors, rigidity  Assets:  Communication Skills Desire for Improvement Social Support  ADL's:  Intact  Cognition: WNL  Sleep:  Fair   Screenings: PHQ2-9     Office Visit from 03/13/2016 in [facility] Office Visit from 08/20/2012 in [facility]  PHQ-2 Total Score  0  0       Assessment and Plan: Palak is a 58 year old Caucasian female who has a history of depression, ADHD, was evaluated by phone today.  Patient continues to struggle with mood symptoms and will benefit from medication readjustment.  Plan as noted below.  Plan Depression- unstable Increase Trintellix to 20 mg p.o. daily BuSpar 20 mg p.o. twice daily Start Abilify 1 mg p.o. daily  Insomnia-improving Trazodone 150 mg p.o.  nightly as needed  Panic  attacks-stable BuSpar as prescribed Klonopin 0.25 mg as needed-patient advised to limit use  ADHD-stable Adderall 20 mg p.o. twice daily I have reviewed [place] controlled substance database. I have given her 1 prescription with date specified-06/25/2019  Tobacco use disorder-unstable Patient is currently working on cutting back, provided smoking cessation counseling  Patient was referred for psychotherapy sessions several times in the past however she has been noncompliant  Follow-up in clinic in 3 to 4 weeks or sooner if needed.  October 16 at 11 AM  I have spent atleast 15 minutes non  face to face with patient today. More than 50 % of the time was spent for psychoeducation and supportive psychotherapy and care coordination. This note was generated in part or whole with voice recognition software. Voice recognition is usually quite accurate but there are transcription errors that can and very often do occur. I apologize for any typographical errors that were not detected and corrected.       Jomarie LongsSaramma Zorana Brockwell, MD 06/17/2019, 12:20 PM

## 2019-07-12 ENCOUNTER — Ambulatory Visit: Payer: No Typology Code available for payment source | Admitting: Licensed Clinical Social Worker

## 2019-07-16 ENCOUNTER — Ambulatory Visit (INDEPENDENT_AMBULATORY_CARE_PROVIDER_SITE_OTHER): Payer: No Typology Code available for payment source | Admitting: Psychiatry

## 2019-07-16 ENCOUNTER — Other Ambulatory Visit: Payer: Self-pay

## 2019-07-16 ENCOUNTER — Encounter: Payer: Self-pay | Admitting: Psychiatry

## 2019-07-16 DIAGNOSIS — F410 Panic disorder [episodic paroxysmal anxiety] without agoraphobia: Secondary | ICD-10-CM

## 2019-07-16 DIAGNOSIS — F331 Major depressive disorder, recurrent, moderate: Secondary | ICD-10-CM

## 2019-07-16 DIAGNOSIS — F900 Attention-deficit hyperactivity disorder, predominantly inattentive type: Secondary | ICD-10-CM | POA: Diagnosis not present

## 2019-07-16 MED ORDER — ARIPIPRAZOLE 2 MG PO TABS: 2.0000 mg | ORAL_TABLET | Freq: Every day | ORAL | 1 refills | Status: DC

## 2019-07-16 MED ORDER — AMPHETAMINE-DEXTROAMPHETAMINE 20 MG PO TABS: 20.0000 mg | ORAL_TABLET | Freq: Two times a day (BID) | ORAL | 0 refills | Status: DC

## 2019-07-16 NOTE — Progress Notes (Signed)
Virtual Visit via Telephone Note  I connected with Dana Benson on 07/16/19 at 11:00 AM EDT by telephone and verified that I am speaking with the correct person using two identifiers.   I discussed the limitations, risks, security and privacy concerns of performing an evaluation and management service by telephone and the availability of in person appointments. I also discussed with the patient that there may be a patient responsible charge related to this service. The patient expressed understanding and agreed to proceed. I discussed the assessment and treatment plan with the patient. The patient was provided an opportunity to ask questions and all were answered. The patient agreed with the plan and demonstrated an understanding of the instructions.   The patient was advised to call back or seek an in-person evaluation if the symptoms worsen or if the condition fails to improve as anticipated.  BH MD OP Progress Note  07/16/2019 12:37 PM Dana Benson  MRN:  [id number]  Chief Complaint:  Chief Complaint    Follow-up     HPI: Dana QuinLinda is a 58 year old Caucasian female who is divorced, employed, lives in [place] was evaluated by phone today.  Patient has a history of major depressive disorder, ADHD and panic attacks.  Patient today preferred to do a phone call.  Patient today reports she continues to have good days and bad days.  She reports there are days when she feels sad and irritable.  She also feels overwhelmed when she is around certain situations.  She reports she also has some good days.  She has to make sure she stays away from certain situations to manage her mood.  October is a stressful month for her.  October is the month of her sons death anniversary as well as birthday.  She reports she is currently trying to spend a lot of time with her grandchild who is 3 months old-Levi.  She reports that definitely has helped her.  She reports she does not think Trintellix is helpful  yet or not.  She is on a very low dosage of Abilify and she has not noticed much benefit from either of those medications yet.  She however denies side effects.  She reports sleep is good.  Patient reports she continues to smoke cigarettes and her smoking may have worsened the past few weeks.  She is not ready to quit yet.  She reports she had to reschedule her psychotherapy appointment however is going to call her therapist back to schedule one as soon as possible.  She denies any suicidality, homicidality or perceptual disturbances.  Patient denies any other concerns today.   Visit Diagnosis:    ICD-10-CM   1. MDD (major depressive disorder), recurrent episode, moderate (HCC)  F33.1 ARIPiprazole (ABILIFY) 2 MG tablet  2. Attention deficit hyperactivity disorder (ADHD), predominantly inattentive type  F90.0 amphetamine-dextroamphetamine (ADDERALL) 20 MG tablet  3. Panic attack  F41.0     Past Psychiatric History: I have reviewed past psychiatric history from my progress note on 01/08/2018.  Past trials of Effexor, Paxil, Wellbutrin, Prozac, Lexapro, Cymbalta, Celexa.  Past Medical History:  Past Medical History:  Diagnosis Date  . ADD (attention deficit disorder with hyperactivity)   . Allergic rhinitis   . Anxiety   . Depression   . GERD (gastroesophageal reflux disease)   . History of chicken pox     Past Surgical History:  Procedure Laterality Date  . APPENDECTOMY  1979  . BLADDER SUSPENSION  1995   during hysterectomy  .  CHOLECYSTECTOMY  1985  . TONSILLECTOMY  1970  . VAGINAL HYSTERECTOMY  1995    Family Psychiatric History: I have reviewed family psychiatric history from my progress note on 01/08/2018.  Family History:  Family History  Problem Relation Age of Onset  . Depression Mother   . Hypertension Mother   . Diabetes Mother   . Depression Sister   . Anxiety disorder Sister   . ADD / ADHD Sister   . Depression Son   . Depression Maternal Aunt   . Breast  cancer Maternal Aunt 41  . Breast cancer Maternal Grandmother 60  . Breast cancer Cousin 51  . Breast cancer Maternal Aunt 56    Social History: I have reviewed social history from my progress note on 01/08/2018. Social History   Socioeconomic History  . Marital status: Single    Spouse name: Not on file  . Number of children: 3  . Years of education: Not on file  . Highest education level: Not on file  Occupational History    Employer: [employer]  . Financial resource strain: Not on file  . Food insecurity    Worry: Not on file    Inability: Not on file  . Transportation needs    Medical: Not on file    Non-medical: Not on file  Tobacco Use  . Smoking status: Current Every Day Smoker    Packs/day: 0.50    Years: 20.00    Pack years: 10.00    Types: Cigarettes  . Smokeless tobacco: Never Used  . Tobacco comment: 2 packs a week  Substance and Sexual Activity  . Alcohol use: Yes    Alcohol/week: 1.0 - 2.0 standard drinks    Types: 1 - 2 Glasses of wine per week    Comment: Wine HS occasional  . Drug use: No  . Sexual activity: Never  Lifestyle  . Physical activity    Days per week: Not on file    Minutes per session: Not on file  . Stress: Not on file  Relationships  . Social Herbalist on phone: Not on file    Gets together: Not on file    Attends religious service: Not on file    Active member of club or organization: Not on file    Attends meetings of clubs or organizations: Not on file    Relationship status: Not on file  Other Topics Concern  . Not on file  Social History Narrative   Lives in [place]. Works at [employer].    Allergies:  Allergies  Allergen Reactions  . Prozac [Fluoxetine Hcl] Other (See Comments)    GI ISSUES  . Trazodone And Nefazodone     nightmares  . Wellbutrin [Bupropion] Other (See Comments)    Dizziness and ? Hearing loss  . Prednisone Anxiety    Metabolic Disorder Labs: Lab Results  Component Value Date    HGBA1C 5.8 (H) 03/02/2018   MPG 119.76 03/02/2018   No results found for: PROLACTIN Lab Results  Component Value Date   CHOL 225 (H) 03/02/2018   TRIG 85 03/02/2018   HDL 56 03/02/2018   CHOLHDL 4.0 03/02/2018   VLDL 17 03/02/2018   LDLCALC 152 (H) 03/02/2018   LDLCALC 146 (H) 06/28/2015   Lab Results  Component Value Date   TSH 1.159 03/02/2018   TSH 1.12 06/28/2015    Therapeutic Level Labs: No results found for: LITHIUM No results found for: VALPROATE No components found  for:  CBMZ  Current Medications: Current Outpatient Medications  Medication Sig Dispense Refill  . amphetamine-dextroamphetamine (ADDERALL) 20 MG tablet Take 1 tablet (20 mg total) by mouth 2 (two) times daily. 60 tablet 0  . [START ON 07/28/2019] amphetamine-dextroamphetamine (ADDERALL) 20 MG tablet Take 1 tablet (20 mg total) by mouth 2 (two) times daily. 60 tablet 0  . ARIPiprazole (ABILIFY) 2 MG tablet Take 1 tablet (2 mg total) by mouth daily. 30 tablet 1  . busPIRone (BUSPAR) 10 MG tablet Take 2 tablets (20 mg total) by mouth 2 (two) times daily. 360 tablet 0  . cetirizine (ZYRTEC) 10 MG tablet Take 10 mg by mouth daily.    . clonazePAM (KLONOPIN) 0.5 MG tablet Take 0.5-1 tablets (0.25-0.5 mg total) by mouth at bedtime as needed for anxiety. 26 tablet 2  . cyanocobalamin (,VITAMIN B-12,) 1000 MCG/ML injection Inject 1 mL (1,000 mcg total) into the muscle every 30 (thirty) days. 1 mL 11  . cyanocobalamin (,VITAMIN B-12,) 1000 MCG/ML injection Inject 1 mL (1,000 mcg total) into the muscle once. 1 mL 0  . fluticasone (FLONASE) 50 MCG/ACT nasal spray Place 2 sprays into both nostrils daily. 16 g 11  . Linaclotide (LINZESS) 290 MCG CAPS capsule Take 1 capsule (290 mcg total) by mouth daily. 30 capsule 6  . ondansetron (ZOFRAN) 8 MG tablet Take 1 tablet (8 mg total) by mouth every 8 (eight) hours as needed for nausea or vomiting. 30 tablet 3  . pantoprazole (PROTONIX) 40 MG tablet   5  . traZODone  (DESYREL) 100 MG tablet Take 1 tablet (100 mg total) by mouth at bedtime. 90 tablet 2  . traZODone (DESYREL) 50 MG tablet Take 1 tablet (50 mg total) by mouth at bedtime as needed for sleep. To be combined with 100 mg 90 tablet 2  . vortioxetine HBr (TRINTELLIX) 20 MG TABS tablet Take 1 tablet (20 mg total) by mouth daily. 30 tablet 1   Current Facility-Administered Medications  Medication Dose Route Frequency Provider Last Rate Last Dose  . cyanocobalamin ((VITAMIN B-12)) injection 1,000 mcg  1,000 mcg Intramuscular Once Sherrie Mustache Roselyn Bering, PA-C         Musculoskeletal: Strength & Muscle Tone: UTA Gait & Station: Reports as WNL Patient leans: N/A  Psychiatric Specialty Exam: Review of Systems  Psychiatric/Behavioral: Positive for depression. The patient is nervous/anxious.   All other systems reviewed and are negative.   There were no vitals taken for this visit.There is no height or weight on file to calculate BMI.  General Appearance: UTA  Eye Contact:  UTA  Speech:  Clear and Coherent  Volume:  Normal  Mood:  Depressed and Irritable  Affect:  UTA  Thought Process:  Goal Directed and Descriptions of Associations: Intact  Orientation:  Full (Time, Place, and Person)  Thought Content: Logical   Suicidal Thoughts:  No  Homicidal Thoughts:  No  Memory:  Immediate;   Fair Recent;   Fair Remote;   Fair  Judgement:  Fair  Insight:  Fair  Psychomotor Activity:  UTA  Concentration:  Concentration: Fair and Attention Span: Fair  Recall:  [employer] of Knowledge: Fair  Language: Fair  Akathisia:  No  Handed:  Right  AIMS (if indicated): Denies tremors, rigidity  Assets:  Communication Skills Desire for Improvement Housing Talents/Skills  ADL's:  Intact  Cognition: WNL  Sleep:  Fair   Screenings: PHQ2-9     [facility] Visit from 03/13/2016 in [facility]  Visit from 08/20/2012 in [facility]  PHQ-2 Total Score  0  0        Assessment and Plan: Cassadie is a 58 year old Caucasian female who has a history of depression, ADHD was evaluated by phone today.  Patient continues to struggle with mood symptoms and will benefit from medication readjustment.  She does struggle with psychosocial stressors of work-related stressors, 06-Aug-2023 being the death anniversary of her son who passed away.  Patient will continue to benefit from psychotherapy sessions-plan as noted below.  Plan Depression-unstable Trintellix 20 mg p.o. daily BuSpar 20 mg p.o. twice daily Increase Abilify to 2 mg p.o. daily  Insomnia-improving Trazodone 150 mg p.o. nightly as needed  Panic attacks-stable BuSpar as prescribed Klonopin 0.25 mg as needed-patient advised to limit use.  ADHD-stable Adderall 20 mg p.o. twice daily. I have reviewed [place] controlled substance database.  I have given her prescriptions with date specified-to be filled on or after 07/28/2019  Tobacco use disorder-unstable  Provided smoking cessation counseling.  Patient was referred for psychotherapy sessions several times in the past however has been noncompliant.  Patient today reports she is going to call back to coordinator to schedule an appointment with therapist today.  Follow-up in clinic in 3 to 4 weeks or sooner if needed.  November 12 at 2 PM  I have spent atleast 15 minutes non  face to face with patient today. More than 50 % of the time was spent for psychoeducation and supportive psychotherapy and care coordination. This note was generated in part or whole with voice recognition software. Voice recognition is usually quite accurate but there are transcription errors that can and very often do occur. I apologize for any typographical errors that were not detected and corrected.       Jomarie Longs, MD 07/16/2019, 12:37 PM

## 2019-08-12 ENCOUNTER — Ambulatory Visit (INDEPENDENT_AMBULATORY_CARE_PROVIDER_SITE_OTHER): Payer: No Typology Code available for payment source | Admitting: Psychiatry

## 2019-08-12 ENCOUNTER — Other Ambulatory Visit: Payer: Self-pay

## 2019-08-12 ENCOUNTER — Encounter: Payer: Self-pay | Admitting: Psychiatry

## 2019-08-12 DIAGNOSIS — F900 Attention-deficit hyperactivity disorder, predominantly inattentive type: Secondary | ICD-10-CM

## 2019-08-12 DIAGNOSIS — F331 Major depressive disorder, recurrent, moderate: Secondary | ICD-10-CM

## 2019-08-12 DIAGNOSIS — F410 Panic disorder [episodic paroxysmal anxiety] without agoraphobia: Secondary | ICD-10-CM | POA: Diagnosis not present

## 2019-08-12 MED ORDER — CLONAZEPAM 0.5 MG PO TABS: 0.2500 mg | ORAL_TABLET | Freq: Every evening | ORAL | 2 refills | Status: DC | PRN

## 2019-08-12 MED ORDER — TRAZODONE HCL 50 MG PO TABS: 50.0000 mg | ORAL_TABLET | Freq: Every evening | ORAL | 2 refills | Status: DC | PRN

## 2019-08-12 MED ORDER — AMPHETAMINE-DEXTROAMPHETAMINE 20 MG PO TABS: 20.0000 mg | ORAL_TABLET | Freq: Two times a day (BID) | ORAL | 0 refills | Status: DC

## 2019-08-12 MED ORDER — VORTIOXETINE HBR 20 MG PO TABS: 20.0000 mg | ORAL_TABLET | Freq: Every day | ORAL | 1 refills | Status: DC

## 2019-08-12 MED ORDER — ARIPIPRAZOLE 2 MG PO TABS: 2.0000 mg | ORAL_TABLET | Freq: Every day | ORAL | 1 refills | Status: DC

## 2019-08-12 MED ORDER — TRAZODONE HCL 100 MG PO TABS: 100.0000 mg | ORAL_TABLET | Freq: Every day | ORAL | 2 refills | Status: DC

## 2019-08-12 MED ORDER — BUSPIRONE HCL 10 MG PO TABS: 20.0000 mg | ORAL_TABLET | Freq: Two times a day (BID) | ORAL | 1 refills | Status: DC

## 2019-08-12 NOTE — Progress Notes (Signed)
Virtual Visit via Telephone Note  I connected with Dana Benson on 08/12/19 at  2:00 PM EST by telephone and verified that I am speaking with the correct person using two identifiers.   I discussed the limitations, risks, security and privacy concerns of performing an evaluation and management service by telephone and the availability of in person appointments. I also discussed with the patient that there may be a patient responsible charge related to this service. The patient expressed understanding and agreed to proceed.   I discussed the assessment and treatment plan with the patient. The patient was provided an opportunity to ask questions and all were answered. The patient agreed with the plan and demonstrated an understanding of the instructions.   The patient was advised to call back or seek an in-person evaluation if the symptoms worsen or if the condition fails to improve as anticipated.   BH MD OP Progress Note  08/12/2019 2:21 PM Dana Benson  MRN:  [id number]  Chief Complaint:  Chief Complaint    Follow-up     HPI: Dana Benson is a 58 year old Caucasian female who is divorced, employed, lives in [place] was evaluated by phone today.  Patient with history of MDD, ADHD and panic attacks.  Patient preferred to do a phone call.  Patient today reports she has definitely noticed an improvement in her mood.  She feels more motivated than before.  She reports she is compliant on the medications as prescribed.  She however reports she has noticed side effects of constipation more likely from the Abilify.  She used to take Linzess in the past.  She is currently not on it.  She reports sleep is good.  Patient reports work continues to be stressful and she is trying to cope with it.  She reports she has been spending time with her grandchild and that keeps her going.  She reports she has upcoming appointment with therapist and plans to keep it.  She denies any suicidality,  homicidality or perceptual disturbances.  She denies any other concerns today.   Visit Diagnosis:    ICD-10-CM   1. MDD (major depressive disorder), recurrent episode, moderate (HCC)  F33.1 traZODone (DESYREL) 50 MG tablet    traZODone (DESYREL) 100 MG tablet    busPIRone (BUSPAR) 10 MG tablet    vortioxetine HBr (TRINTELLIX) 20 MG TABS tablet    ARIPiprazole (ABILIFY) 2 MG tablet  2. Attention deficit hyperactivity disorder (ADHD), predominantly inattentive type  F90.0 amphetamine-dextroamphetamine (ADDERALL) 20 MG tablet    amphetamine-dextroamphetamine (ADDERALL) 20 MG tablet  3. Panic attack  F41.0 busPIRone (BUSPAR) 10 MG tablet    vortioxetine HBr (TRINTELLIX) 20 MG TABS tablet    clonazePAM (KLONOPIN) 0.5 MG tablet    Past Psychiatric History: Reviewed past psychiatric history from my progress note on 01/08/2018.  Past trials of Effexor, Paxil, Wellbutrin, Prozac, Lexapro, Cymbalta, Celexa  Past Medical History:  Past Medical History:  Diagnosis Date  . ADD (attention deficit disorder with hyperactivity)   . Allergic rhinitis   . Anxiety   . Depression   . GERD (gastroesophageal reflux disease)   . History of chicken pox     Past Surgical History:  Procedure Laterality Date  . APPENDECTOMY  1979  . BLADDER SUSPENSION  1995   during hysterectomy  . CHOLECYSTECTOMY  1985  . TONSILLECTOMY  1970  . VAGINAL HYSTERECTOMY  1995    Family Psychiatric History: Reviewed family psychiatric history from my progress note on 01/08/2018  Family  History:  Family History  Problem Relation Age of Onset  . Depression Mother   . Hypertension Mother   . Diabetes Mother   . Depression Sister   . Anxiety disorder Sister   . ADD / ADHD Sister   . Depression Son   . Depression Maternal Aunt   . Breast cancer Maternal Aunt 11  . Breast cancer Maternal Grandmother 60  . Breast cancer Cousin 54  . Breast cancer Maternal Aunt 56    Social History: Reviewed social history from my  progress note on 01/08/2018 Social History   Socioeconomic History  . Marital status: Single    Spouse name: Not on file  . Number of children: 3  . Years of education: Not on file  . Highest education level: Not on file  Occupational History    Employer: [employer]  . Financial resource strain: Not on file  . Food insecurity    Worry: Not on file    Inability: Not on file  . Transportation needs    Medical: Not on file    Non-medical: Not on file  Tobacco Use  . Smoking status: Current Every Day Smoker    Packs/day: 0.50    Years: 20.00    Pack years: 10.00    Types: Cigarettes  . Smokeless tobacco: Never Used  . Tobacco comment: 2 packs a week  Substance and Sexual Activity  . Alcohol use: Yes    Alcohol/week: 1.0 - 2.0 standard drinks    Types: 1 - 2 Glasses of wine per week    Comment: Wine HS occasional  . Drug use: No  . Sexual activity: Never  Lifestyle  . Physical activity    Days per week: Not on file    Minutes per session: Not on file  . Stress: Not on file  Relationships  . Social Herbalist on phone: Not on file    Gets together: Not on file    Attends religious service: Not on file    Active member of club or organization: Not on file    Attends meetings of clubs or organizations: Not on file    Relationship status: Not on file  Other Topics Concern  . Not on file  Social History Narrative   Lives in [place]. Works at [employer].    Allergies:  Allergies  Allergen Reactions  . Prozac [Fluoxetine Hcl] Other (See Comments)    GI ISSUES  . Trazodone And Nefazodone     nightmares  . Wellbutrin [Bupropion] Other (See Comments)    Dizziness and ? Hearing loss  . Prednisone Anxiety    Metabolic Disorder Labs: Lab Results  Component Value Date   HGBA1C 5.8 (H) 03/02/2018   MPG 119.76 03/02/2018   No results found for: PROLACTIN Lab Results  Component Value Date   CHOL 225 (H) 03/02/2018   TRIG 85 03/02/2018   HDL 56  03/02/2018   CHOLHDL 4.0 03/02/2018   VLDL 17 03/02/2018   LDLCALC 152 (H) 03/02/2018   LDLCALC 146 (H) 06/28/2015   Lab Results  Component Value Date   TSH 1.159 03/02/2018   TSH 1.12 06/28/2015    Therapeutic Level Labs: No results found for: LITHIUM No results found for: VALPROATE No components found for:  CBMZ  Current Medications: Current Outpatient Medications  Medication Sig Dispense Refill  . [START ON 08/27/2019] amphetamine-dextroamphetamine (ADDERALL) 20 MG tablet Take 1 tablet (20 mg total) by mouth 2 (two) times daily.  60 tablet 0  . [START ON 09/25/2019] amphetamine-dextroamphetamine (ADDERALL) 20 MG tablet Take 1 tablet (20 mg total) by mouth 2 (two) times daily. 60 tablet 0  . ARIPiprazole (ABILIFY) 2 MG tablet Take 1 tablet (2 mg total) by mouth daily. 90 tablet 1  . busPIRone (BUSPAR) 10 MG tablet Take 2 tablets (20 mg total) by mouth 2 (two) times daily. 360 tablet 1  . cetirizine (ZYRTEC) 10 MG tablet Take 10 mg by mouth daily.    . clonazePAM (KLONOPIN) 0.5 MG tablet Take 0.5-1 tablets (0.25-0.5 mg total) by mouth at bedtime as needed for anxiety. 26 tablet 2  . cyanocobalamin (,VITAMIN B-12,) 1000 MCG/ML injection Inject 1 mL (1,000 mcg total) into the muscle every 30 (thirty) days. 1 mL 11  . cyanocobalamin (,VITAMIN B-12,) 1000 MCG/ML injection Inject 1 mL (1,000 mcg total) into the muscle once. 1 mL 0  . fluticasone (FLONASE) 50 MCG/ACT nasal spray Place 2 sprays into both nostrils daily. 16 g 11  . Linaclotide (LINZESS) 290 MCG CAPS capsule Take 1 capsule (290 mcg total) by mouth daily. 30 capsule 6  . ondansetron (ZOFRAN) 8 MG tablet Take 1 tablet (8 mg total) by mouth every 8 (eight) hours as needed for nausea or vomiting. 30 tablet 3  . pantoprazole (PROTONIX) 40 MG tablet   5  . traZODone (DESYREL) 100 MG tablet Take 1 tablet (100 mg total) by mouth at bedtime. 90 tablet 2  . traZODone (DESYREL) 50 MG tablet Take 1 tablet (50 mg total) by mouth at  bedtime as needed for sleep. To be combined with 100 mg 90 tablet 2  . vortioxetine HBr (TRINTELLIX) 20 MG TABS tablet Take 1 tablet (20 mg total) by mouth daily. 90 tablet 1   Current Facility-Administered Medications  Medication Dose Route Frequency Provider Last Rate Last Dose  . cyanocobalamin ((VITAMIN B-12)) injection 1,000 mcg  1,000 mcg Intramuscular Once Sherrie Mustache Roselyn Bering, PA-C         Musculoskeletal: Strength & Muscle Tone: UTA Gait & Station: Reports as WNL Patient leans: N/A  Psychiatric Specialty Exam: Review of Systems  Psychiatric/Behavioral: Positive for depression.  All other systems reviewed and are negative.   There were no vitals taken for this visit.There is no height or weight on file to calculate BMI.  General Appearance: UTA  Eye Contact:  UTA  Speech:  Clear and Coherent  Volume:  Normal  Mood:  Dysphoric improving  Affect:  UTA  Thought Process:  Goal Directed and Descriptions of Associations: Intact  Orientation:  Full (Time, Place, and Person)  Thought Content: Logical   Suicidal Thoughts:  No  Homicidal Thoughts:  No  Memory:  Immediate;   Fair Recent;   Fair Remote;   Fair  Judgement:  Fair  Insight:  Fair  Psychomotor Activity:  UTA  Concentration:  Concentration: Fair and Attention Span: Fair  Recall:  [employer] of Knowledge: Fair  Language: Fair  Akathisia:  No  Handed:  Right  AIMS (if indicated): denies tremors, rigidity  Assets:  Communication Skills Desire for Improvement Housing Social Support  ADL's:  Intact  Cognition: WNL  Sleep:  Fair   Screenings: PHQ2-9     Office Visit from 03/13/2016 in [facility] Office Visit from 08/20/2012 in [facility]  PHQ-2 Total Score  0  0       Assessment and Plan: Geraldin is a 58 year old Caucasian female, has a history of depression, ADHD, was  evaluated by phone today.  Patient is currently making progress with regards to her depressive symptoms.   Patient with psychosocial stressors of work-related problems, death of her son, current pandemic.  She however has been compliant with medications and is motivated to start psychotherapy sessions.    Plan Depression-improving Trintellix 20 mg p.o. daily BuSpar 20 mg p.o. twice daily Abilify 2 mg p.o. daily.  Patient is interested in dosage increase of Abilify today however will not make any changes today since she has constipation. Patient to start psychotherapy sessions with Ms. Marcy Siren upcoming appointment.  Insomnia-improving Trazodone 150 mg p.o. nightly as needed  Panic attacks-stable BuSpar as prescribed Klonopin 0.25 mg as needed-patient advised to limit use. I have reviewed [place] controlled substance database.  ADHD-stable Adderall 20 mg p.o. twice daily Provided her 2 prescriptions with date specified last 1 to be filled on or after 09/25/2019 I have reviewed [place] controlled substance database.  Tobacco use disorder-unstable Provided smoking cessation counseling.  Patient was referred for psychotherapy sessions several times in the past-she is noncompliant.  Follow-up in clinic in 1 month or sooner if needed.  January 13 at 2 PM  I have spent atleast 15 minutes non face to face with patient today. More than 50 % of the time was spent for psychoeducation and supportive psychotherapy and care coordination. This note was generated in part or whole with voice recognition software. Voice recognition is usually quite accurate but there are transcription errors that can and very often do occur. I apologize for any typographical errors that were not detected and corrected.       Jomarie Longs, MD 08/12/2019, 2:21 PM

## 2019-08-17 ENCOUNTER — Encounter: Payer: Self-pay | Admitting: Licensed Clinical Social Worker

## 2019-08-17 ENCOUNTER — Other Ambulatory Visit: Payer: Self-pay

## 2019-08-17 ENCOUNTER — Ambulatory Visit (INDEPENDENT_AMBULATORY_CARE_PROVIDER_SITE_OTHER): Payer: No Typology Code available for payment source | Admitting: Licensed Clinical Social Worker

## 2019-08-17 DIAGNOSIS — F331 Major depressive disorder, recurrent, moderate: Secondary | ICD-10-CM | POA: Diagnosis not present

## 2019-08-17 DIAGNOSIS — F411 Generalized anxiety disorder: Secondary | ICD-10-CM

## 2019-08-17 DIAGNOSIS — F900 Attention-deficit hyperactivity disorder, predominantly inattentive type: Secondary | ICD-10-CM
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
## 2019-09-02 ENCOUNTER — Other Ambulatory Visit
Admission: RE | Admit: 2019-09-02 | Discharge: 2019-09-02 | Disposition: A | Discharge status: Home / Self Care | Payer: No Typology Code available for payment source | Source: Ambulatory Visit | Attending: Physician Assistant | Admitting: Physician Assistant

## 2019-09-02 DIAGNOSIS — Z0000 Encounter for general adult medical examination without abnormal findings: Secondary | ICD-10-CM | POA: Diagnosis not present

## 2019-09-02 DIAGNOSIS — R7303 Prediabetes: Secondary | ICD-10-CM | POA: Insufficient documentation

## 2019-09-02 DIAGNOSIS — E538 Deficiency of other specified B group vitamins: Secondary | ICD-10-CM | POA: Diagnosis not present

## 2019-09-02 DIAGNOSIS — R5383 Other fatigue: Secondary | ICD-10-CM | POA: Insufficient documentation

## 2019-09-02 LAB — COMPREHENSIVE METABOLIC PANEL
ALT: 31 U/L (ref 0–44)
AST: 23 U/L (ref 15–41)
Albumin: 4.4 g/dL (ref 3.5–5.0)
Alkaline Phosphatase: 88 U/L (ref 38–126)
Anion gap: 10 (ref 5–15)
BUN: 11 mg/dL (ref 6–20)
CO2: 24 mmol/L (ref 22–32)
Calcium: 9.2 mg/dL (ref 8.9–10.3)
Chloride: 102 mmol/L (ref 98–111)
Creatinine, Ser: 0.89 mg/dL (ref 0.44–1.00)
GFR calc Af Amer: >60 mL/min (ref >60)
GFR calc non Af Amer: >60 mL/min (ref >60)
Glucose, Bld: 107 mg/dL — ABNORMAL HIGH (ref 70–99)
Potassium: 4.2 mmol/L (ref 3.5–5.1)
Sodium: 136 mmol/L (ref 135–145)
Total Bilirubin: 0.5 mg/dL (ref 0.3–1.2)
Total Protein: 7.1 g/dL (ref 6.5–8.1)

## 2019-09-02 LAB — URINALYSIS, COMPLETE (UACMP) WITH MICROSCOPIC
Bacteria, UA: NONE SEEN
Bilirubin Urine: NEGATIVE
Glucose, UA: NEGATIVE mg/dL
Hgb urine dipstick: NEGATIVE
Ketones, ur: NEGATIVE mg/dL
Leukocytes,Ua: NEGATIVE
Nitrite: NEGATIVE
Protein, ur: NEGATIVE mg/dL
Specific Gravity, Urine: 1.011 (ref 1.005–1.030)
pH: 6 (ref 5.0–8.0)

## 2019-09-02 LAB — CBC WITH DIFFERENTIAL/PLATELET
Abs Immature Granulocytes: 0.03 10*3/uL (ref 0.00–0.07)
Basophils Absolute: 0.1 10*3/uL (ref 0.0–0.1)
Basophils Relative: 1 %
Eosinophils Absolute: 0.2 10*3/uL (ref 0.0–0.5)
Eosinophils Relative: 3 %
HCT: 40.5 % (ref 36.0–46.0)
Hemoglobin: 13.9 g/dL (ref 12.0–15.0)
Immature Granulocytes: 0 %
Lymphocytes Relative: 32 %
Lymphs Abs: 2.2 10*3/uL (ref 0.7–4.0)
MCH: 32 pg (ref 26.0–34.0)
MCHC: 34.3 g/dL (ref 30.0–36.0)
MCV: 93.3 fL (ref 80.0–100.0)
Monocytes Absolute: 0.5 10*3/uL (ref 0.1–1.0)
Monocytes Relative: 7 %
Neutro Abs: 4 10*3/uL (ref 1.7–7.7)
Neutrophils Relative %: 57 %
Platelets: 267 10*3/uL (ref 150–400)
RBC: 4.34 MIL/uL (ref 3.87–5.11)
RDW: 12 % (ref 11.5–15.5)
WBC: 7 10*3/uL (ref 4.0–10.5)
nRBC: 0 % (ref 0.0–0.2)

## 2019-09-02 LAB — LIPID PANEL
Cholesterol: 242 mg/dL — ABNORMAL HIGH (ref 0–200)
HDL: 67 mg/dL (ref >40)
LDL Cholesterol: 157 mg/dL — ABNORMAL HIGH (ref 0–99)
Total CHOL/HDL Ratio: 3.6 RATIO
Triglycerides: 92 mg/dL (ref <150)
VLDL: 18 mg/dL (ref 0–40)

## 2019-09-02 LAB — HEMOGLOBIN A1C
Hgb A1c MFr Bld: 6.2 % — ABNORMAL HIGH (ref 4.8–5.6)
Mean Plasma Glucose: 131.24 mg/dL

## 2019-09-02 LAB — TSH: TSH: 1.406 u[IU]/mL (ref 0.350–4.500)

## 2019-09-02 LAB — VITAMIN B12: Vitamin B-12: 195 pg/mL (ref 180–914)

## 2019-09-13 ENCOUNTER — Other Ambulatory Visit: Payer: Self-pay

## 2019-09-13 ENCOUNTER — Ambulatory Visit (INDEPENDENT_AMBULATORY_CARE_PROVIDER_SITE_OTHER): Payer: No Typology Code available for payment source | Admitting: Licensed Clinical Social Worker

## 2019-09-13 ENCOUNTER — Encounter: Payer: Self-pay | Admitting: Licensed Clinical Social Worker

## 2019-09-13 DIAGNOSIS — F331 Major depressive disorder, recurrent, moderate: Secondary | ICD-10-CM | POA: Diagnosis not present

## 2019-09-13 DIAGNOSIS — F411 Generalized anxiety disorder: Secondary | ICD-10-CM | POA: Diagnosis not present

## 2019-09-13 DIAGNOSIS — F900 Attention-deficit hyperactivity disorder, predominantly inattentive type: Secondary | ICD-10-CM
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
## 2019-09-28 ENCOUNTER — Encounter: Payer: Self-pay | Admitting: Licensed Clinical Social Worker

## 2019-09-28 ENCOUNTER — Other Ambulatory Visit: Payer: Self-pay

## 2019-09-28 ENCOUNTER — Ambulatory Visit (INDEPENDENT_AMBULATORY_CARE_PROVIDER_SITE_OTHER): Payer: No Typology Code available for payment source | Admitting: Licensed Clinical Social Worker

## 2019-09-28 DIAGNOSIS — F331 Major depressive disorder, recurrent, moderate: Secondary | ICD-10-CM

## 2019-09-28 NOTE — Progress Notes (Signed)
  Virtual Visit via Video Note  I connected with Dana Benson on 09/28/19 at 12:30 PM EST by a video enabled telemedicine application and verified that I am speaking with the correct person using two identifiers.   I discussed the limitations of evaluation and management by telemedicine and the availability of in person appointments. The patient expressed understanding and agreed to proceed.    I discussed the assessment and treatment plan with the patient. The patient was provided an opportunity to ask questions and all were answered. The patient agreed with the plan and demonstrated an understanding of the instructions.   The patient was advised to call back or seek an in-person evaluation if the symptoms worsen or if the condition fails to improve as anticipated.  I provided 35 minutes of non-face-to-face time during this encounter.   Alden Hipp, LCSW   THERAPIST PROGRESS NOTE  Session Time: 1230  Participation Level: Active  Behavioral Response: GuardedAlertAnxious  Type of Therapy: Individual Therapy  Treatment Goals addressed: Coping  Interventions: Supportive  Summary: Dana Benson is a 58 y.o. female who presents with continued symptoms related to her diagnosis. Dana Benson reports doing well since our last session, but noted she has "not really done anything." Dana Benson was distracted during today's session, but was still able to engage appropriately. She reported feeling she was in a "fog," from the holidays. She reports feeling overloaded, and just tired after being around so many people. LCSW validated Dana Benson's feelings and encouraged her to give herself time to rest without feeling guilty about it. Dana Benson expressed understanding and agreement with this information as well. Dana Benson went on to report feeling she has disappointed herself daily as she makes to do lists in the evenings but then "doesn't want to get out of bed the next day." LCSW asked Dana Benson if she was able to set  small goals like we'd discussed in our previous session. Dana Benson reported she had not done that, and was unsure why. LCSW reassured Dana Benson it was okay, but reviewed why setting small goals would be important in building motivation and momentum moving forward. Dana Benson expressed understanding and agreement. LCSW held space for Dana Benson to discuss her goals and what that would look like moving forward. Additionally, LCSW introduced the idea of CBT and how CBT could be utilized to change feelings/behaviors. LCSW shared several resources via email with Dana Benson and reviewed the information on today's call as well.    Suicidal/Homicidal: No  Therapist Response: Dana Benson continues to work towards her tx goals but has not yet reached them. We will continue to work on improving emotional regulation skills and CBT skills moving forward.   Plan: Return again in 2 weeks.  Diagnosis: Axis I: MDD    Axis II: No diagnosis    Alden Hipp, LCSW 09/28/2019

## 2019-10-12 ENCOUNTER — Encounter: Payer: Self-pay | Admitting: Licensed Clinical Social Worker

## 2019-10-12 ENCOUNTER — Other Ambulatory Visit: Payer: Self-pay

## 2019-10-12 ENCOUNTER — Ambulatory Visit (INDEPENDENT_AMBULATORY_CARE_PROVIDER_SITE_OTHER): Payer: No Typology Code available for payment source | Admitting: Licensed Clinical Social Worker

## 2019-10-12 DIAGNOSIS — F331 Major depressive disorder, recurrent, moderate: Secondary | ICD-10-CM

## 2019-10-12 NOTE — Progress Notes (Signed)
  Virtual Visit via Telephone Note  I connected with Ellin Mayhew on 10/12/19 at 12:30 PM EST by telephone and verified that I am speaking with the correct person using two identifiers.   I discussed the limitations, risks, security and privacy concerns of performing an evaluation and management service by telephone and the availability of in person appointments. I also discussed with the patient that there may be a patient responsible charge related to this service. The patient expressed understanding and agreed to proceed    I discussed the assessment and treatment plan with the patient. The patient was provided an opportunity to ask questions and all were answered. The patient agreed with the plan and demonstrated an understanding of the instructions.   The patient was advised to call back or seek an in-person evaluation if the symptoms worsen or if the condition fails to improve as anticipated.  I provided 45 minutes of non-face-to-face time during this encounter.   Heidi Dach, LCSW   THERAPIST PROGRESS NOTE  Session Time: 1230  Participation Level: Active  Behavioral Response: NeatAlertDepressed  Type of Therapy: Individual Therapy  Treatment Goals addressed: Coping  Interventions: Supportive  Summary: ROSLIND MICHAUX is a 59 y.o. female who presents with continued symptoms related to her diagnosis. Jaritza reports she was not able to speak for our whole session, as her work schedule changed and she is currently at work. LCSW validated this information and agreed it can be difficult to feel comfortable discussing personal issues while worrying others can hear you. Verdelle expressed doing "about the same," since our last session. Kamber reports she was, however, able to start setting small goals for herself each day, and assigning herself one task to complete. "I was actually able to do that--not as much as I should, but I still did it." LCSW validated Karleigh's efforts and  encouraged her to recognize setting and completing small goals even twice a week was more than she was doing--so that is progress. Emmely was able to recognize this as well. Saphia moved on to discussing the COVID-19 vaccine, and how she received her second dose today. Oluchi reported feeling anxious about the negative side effects and how that would impact her. LCSW validated these feelings and encouraged Oluwakemi to consider the alternative to having not gotten the vaccine. Adreana expressed agreement with this and noted it is difficult, at times, to stay positive. LCSW encouraged Shellee to revisit the worksheets she sent via email, and start challenging negative thoughts in that way. LCSW reviewed one of the handouts with [place] on the phone in order to allow full understanding. Keaisha expressed understanding of the material presented.   Suicidal/Homicidal: No   Therapist Response: Donnette continues to work towards her tx goals but has not yet reached them. We will continue to work on improving emotional regulation skills and distress tolerance moving forward.   Plan: Return again in 4 weeks.  Diagnosis: Axis I: MDD    Axis II: No diagnosis    Heidi Dach, LCSW 10/12/2019

## 2019-10-13 ENCOUNTER — Ambulatory Visit (INDEPENDENT_AMBULATORY_CARE_PROVIDER_SITE_OTHER): Payer: No Typology Code available for payment source | Admitting: Psychiatry

## 2019-10-13 ENCOUNTER — Encounter: Payer: Self-pay | Admitting: Psychiatry

## 2019-10-13 DIAGNOSIS — F410 Panic disorder [episodic paroxysmal anxiety] without agoraphobia: Secondary | ICD-10-CM

## 2019-10-13 DIAGNOSIS — F900 Attention-deficit hyperactivity disorder, predominantly inattentive type: Secondary | ICD-10-CM

## 2019-10-13 DIAGNOSIS — F411 Generalized anxiety disorder: Secondary | ICD-10-CM | POA: Insufficient documentation

## 2019-10-13 DIAGNOSIS — F341 Dysthymic disorder: Secondary | ICD-10-CM | POA: Diagnosis not present

## 2019-10-13 MED ORDER — ARIPIPRAZOLE 5 MG PO TABS: 5.0000 mg | ORAL_TABLET | Freq: Every day | ORAL | 0 refills | Status: DC

## 2019-10-13 MED ORDER — AMPHETAMINE-DEXTROAMPHETAMINE 20 MG PO TABS: 20.0000 mg | ORAL_TABLET | Freq: Two times a day (BID) | ORAL | 0 refills | Status: DC

## 2019-10-13 MED ORDER — CLONAZEPAM 0.5 MG PO TABS: 0.2500 mg | ORAL_TABLET | Freq: Every evening | ORAL | 2 refills | Status: DC | PRN

## 2019-10-13 NOTE — Progress Notes (Signed)
Virtual Visit via Telephone Note  I connected with Dana Benson on 10/13/19 at  2:00 PM EST by telephone and verified that I am speaking with the correct person using two identifiers.   I discussed the limitations, risks, security and privacy concerns of performing an evaluation and management service by telephone and the availability of in person appointments. I also discussed with the patient that there may be a patient responsible charge related to this service. The patient expressed understanding and agreed to proceed.    I discussed the assessment and treatment plan with the patient. The patient was provided an opportunity to ask questions and all were answered. The patient agreed with the plan and demonstrated an understanding of the instructions.   The patient was advised to call back or seek an in-person evaluation if the symptoms worsen or if the condition fails to improve as anticipated.   BH MD OP Progress Note  10/13/2019 4:33 PM [name]  MRN:  [id number]  Chief Complaint:  Chief Complaint    Follow-up     HPI: Dana Benson is a 59 year old Caucasian female who is divorced, employed, lives in [place], has a history of MDD, ADHD, panic attacks was evaluated by phone today.  Patient preferred to do a phone call.  Patient today reports she is currently struggling with possible depressive symptoms.  She reports she feels as though she is just going with the motion and feels stuck at one place.  She reports she is compliant on her medications as prescribed.  She reports she started seeing her therapist Ms. Heidi Dach and has been talking to her every 2 weeks or so.  Patient however reports she is worried about the co-pay and the financial part of it.  Patient reports sleep is good.  She denies any suicidality, homicidality or perceptual disturbances.  Patient is currently back at work 40 hours which is a change.  She reports she is happy about the financial part of  it.  Patient denies any other concerns today. Visit Diagnosis:    ICD-10-CM   1. Persistent depressive disorder with anxious distress, currently moderate  F34.1 ARIPiprazole (ABILIFY) 5 MG tablet   with intermittent major depressive episodes  2. Attention deficit hyperactivity disorder (ADHD), predominantly inattentive type  F90.0 amphetamine-dextroamphetamine (ADDERALL) 20 MG tablet  3. Panic attack  F41.0 clonazePAM (KLONOPIN) 0.5 MG tablet    Past Psychiatric History: I have reviewed past psychiatric history from my progress note on 01/08/2018.  Past trials of Effexor, Paxil, Wellbutrin, Prozac, Lexapro, Cymbalta, Celexa.  Past Medical History:  Past Medical History:  Diagnosis Date  . ADD (attention deficit disorder with hyperactivity)   . Allergic rhinitis   . Anxiety   . Depression   . GERD (gastroesophageal reflux disease)   . History of chicken pox     Past Surgical History:  Procedure Laterality Date  . APPENDECTOMY  1979  . BLADDER SUSPENSION  1995   during hysterectomy  . CHOLECYSTECTOMY  1985  . TONSILLECTOMY  1970  . VAGINAL HYSTERECTOMY  1995    Family Psychiatric History: Reviewed family psychiatric history from my progress note on 01/08/2018.  Family History:  Family History  Problem Relation Age of Onset  . Depression Mother   . Hypertension Mother   . Diabetes Mother   . Depression Sister   . Anxiety disorder Sister   . ADD / ADHD Sister   . Depression Son   . Depression Maternal Aunt   . Breast  cancer Maternal Aunt 44  . Breast cancer Maternal Grandmother 60  . Breast cancer Cousin 39  . Breast cancer Maternal Aunt 56    Social History: Reviewed social history from my progress note on 01/08/2018. Social History   Socioeconomic History  . Marital status: Single    Spouse name: Not on file  . Number of children: 3  . Years of education: Not on file  . Highest education level: Not on file  Occupational History    Employer: [employer]  Tobacco Use   . Smoking status: Current Every Day Smoker    Packs/day: 0.50    Years: 20.00    Pack years: 10.00    Types: Cigarettes  . Smokeless tobacco: Never Used  . Tobacco comment: 2 packs a week  Substance and Sexual Activity  . Alcohol use: Yes    Alcohol/week: 1.0 - 2.0 standard drinks    Types: 1 - 2 Glasses of wine per week    Comment: Wine HS occasional  . Drug use: No  . Sexual activity: Never  Other Topics Concern  . Not on file  Social History Narrative   Lives in [place]. Works at [employer].   Social Determinants of Health   Financial Resource Strain:   . Difficulty of Paying Living Expenses: Not on file  Food Insecurity:   . Worried About Charity fundraiser in the Last Year: Not on file  . Ran Out of Food in the Last Year: Not on file  Transportation Needs:   . Lack of Transportation (Medical): Not on file  . Lack of Transportation (Non-Medical): Not on file  Physical Activity:   . Days of Exercise per Week: Not on file  . Minutes of Exercise per Session: Not on file  Stress:   . Feeling of Stress : Not on file  Social Connections:   . Frequency of Communication with Friends and Family: Not on file  . Frequency of Social Gatherings with Friends and Family: Not on file  . Attends Religious Services: Not on file  . Active Member of Clubs or Organizations: Not on file  . Attends Archivist Meetings: Not on file  . Marital Status: Not on file    Allergies:  Allergies  Allergen Reactions  . Prozac [Fluoxetine Hcl] Other (See Comments)    GI ISSUES  . Trazodone And Nefazodone     nightmares  . Wellbutrin [Bupropion] Other (See Comments)    Dizziness and ? Hearing loss  . Prednisone Anxiety    Metabolic Disorder Labs: Lab Results  Component Value Date   HGBA1C 6.2 (H) 09/02/2019   MPG 131.24 09/02/2019   MPG 119.76 03/02/2018   No results found for: PROLACTIN Lab Results  Component Value Date   CHOL 242 (H) 09/02/2019   TRIG 92 09/02/2019    HDL 67 09/02/2019   CHOLHDL 3.6 09/02/2019   VLDL 18 09/02/2019   LDLCALC 157 (H) 09/02/2019   LDLCALC 152 (H) 03/02/2018   Lab Results  Component Value Date   TSH 1.406 09/02/2019   TSH 1.159 03/02/2018    Therapeutic Level Labs: No results found for: LITHIUM No results found for: VALPROATE No components found for:  CBMZ  Current Medications: Current Outpatient Medications  Medication Sig Dispense Refill  . amphetamine-dextroamphetamine (ADDERALL) 20 MG tablet Take 1 tablet (20 mg total) by mouth 2 (two) times daily. 60 tablet 0  . [START ON 11/06/2019] amphetamine-dextroamphetamine (ADDERALL) 20 MG tablet Take 1 tablet (20 mg total)  by mouth 2 (two) times daily. 60 tablet 0  . ARIPiprazole (ABILIFY) 5 MG tablet Take 1 tablet (5 mg total) by mouth daily. 90 tablet 0  . busPIRone (BUSPAR) 10 MG tablet Take 2 tablets (20 mg total) by mouth 2 (two) times daily. 360 tablet 1  . cetirizine (ZYRTEC) 10 MG tablet Take 10 mg by mouth daily.    . clonazePAM (KLONOPIN) 0.5 MG tablet Take 0.5-1 tablets (0.25-0.5 mg total) by mouth at bedtime as needed for anxiety. 26 tablet 2  . cyanocobalamin (,VITAMIN B-12,) 1000 MCG/ML injection Inject 1 mL (1,000 mcg total) into the muscle every 30 (thirty) days. 1 mL 11  . cyanocobalamin (,VITAMIN B-12,) 1000 MCG/ML injection Inject 1 mL (1,000 mcg total) into the muscle once. 1 mL 0  . fluticasone (FLONASE) 50 MCG/ACT nasal spray Place 2 sprays into both nostrils daily. 16 g 11  . Linaclotide (LINZESS) 290 MCG CAPS capsule Take 1 capsule (290 mcg total) by mouth daily. 30 capsule 6  . ondansetron (ZOFRAN) 8 MG tablet Take 1 tablet (8 mg total) by mouth every 8 (eight) hours as needed for nausea or vomiting. 30 tablet 3  . pantoprazole (PROTONIX) 40 MG tablet   5  . traZODone (DESYREL) 100 MG tablet Take 1 tablet (100 mg total) by mouth at bedtime. 90 tablet 2  . traZODone (DESYREL) 50 MG tablet Take 1 tablet (50 mg total) by mouth at bedtime as needed for  sleep. To be combined with 100 mg 90 tablet 2  . vortioxetine HBr (TRINTELLIX) 20 MG TABS tablet Take 1 tablet (20 mg total) by mouth daily. 90 tablet 1   Current Facility-Administered Medications  Medication Dose Route Frequency Provider Last Rate Last Admin  . cyanocobalamin ((VITAMIN B-12)) injection 1,000 mcg  1,000 mcg Intramuscular Once Sherrie Mustache Roselyn Bering, PA-C         Musculoskeletal: Strength & Muscle Tone: UTA Gait & Station: Reports as WNL Patient leans: N/A  Psychiatric Specialty Exam: Review of Systems  Psychiatric/Behavioral: Positive for dysphoric mood.  All other systems reviewed and are negative.   There were no vitals taken for this visit.There is no height or weight on file to calculate BMI.  General Appearance: UTA  Eye Contact:  UTA  Speech:  Clear and Coherent  Volume:  Normal  Mood:  Depressed  Affect:  UTA  Thought Process:  Goal Directed and Descriptions of Associations: Intact  Orientation:  Full (Time, Place, and Person)  Thought Content: Logical   Suicidal Thoughts:  No  Homicidal Thoughts:  No  Memory:  Immediate;   Fair Recent;   Fair Remote;   Fair  Judgement:  Fair  Insight:  Fair  Psychomotor Activity:  UTA  Concentration:  Concentration: Fair and Attention Span: Fair  Recall:  [employer] of Knowledge: Fair  Language: Fair  Akathisia:  No  Handed:  Right  AIMS (if indicated):Denies tremors, rigidity  Assets:  Communication Skills Desire for Improvement Social Support  ADL's:  Intact  Cognition: WNL  Sleep:  Fair   Screenings: PHQ2-9     Office Visit from 03/13/2016 in [facility] Office Visit from 08/20/2012 in [facility]  PHQ-2 Total Score  0  0       Assessment and Plan: Taina is a 59 year old Caucasian female, has a history of depression, ADHD, was evaluated by phone today.  Patient is currently struggling with depressive symptoms and will benefit from medication readjustment.  She has  started psychotherapy sessions which are beneficial.  Plan as noted below.  Plan Depression-improving Trintellix 20 mg p.o. daily BuSpar 20 mg p.o. twice daily Increase Abilify to 5 mg p.o. daily Patient to continue psychotherapy sessions with Ms. Heidi Dach. Also discussed with patient possible referral to [facility]-provided her [phone].  Insomnia-improving Trazodone 150 mg p.o. nightly as needed  Panic attacks-stable BuSpar as prescribed Klonopin 0.25 mg p.o. daily as needed-patient advised to limit use I have reviewed [place] controlled substance database.  ADHD-stable Adderall 20 mg p.o. twice daily Provided her 1 prescription with date specified to be filled on or after 11/06/2019. I have reviewed [place] controlled substance database.  Tobacco use disorder-unstable We will monitor closely  Follow-up in clinic in 1 to 1-1/2 months or sooner if needed.  Patient advised to make use of therapy sessions and to contact writer if she has concerns about medications.  I have spent atleast 20 minutes non face to face with patient today. More than 50 % of the time was spent for ordering medications and test ,psychoeducation and supportive psychotherapy and care coordination,as well as documenting clinical information in electronic health record. This note was generated in part or whole with voice recognition software. Voice recognition is usually quite accurate but there are transcription errors that can and very often do occur. I apologize for any typographical errors that were not detected and corrected.        Jomarie Longs, MD 10/13/2019, 4:33 PM

## 2019-11-04 ENCOUNTER — Other Ambulatory Visit: Payer: Self-pay

## 2019-11-04 ENCOUNTER — Ambulatory Visit (INDEPENDENT_AMBULATORY_CARE_PROVIDER_SITE_OTHER): Payer: No Typology Code available for payment source | Admitting: Licensed Clinical Social Worker

## 2019-11-04 DIAGNOSIS — F411 Generalized anxiety disorder: Secondary | ICD-10-CM

## 2019-11-04 DIAGNOSIS — F341 Dysthymic disorder: Secondary | ICD-10-CM | POA: Diagnosis not present

## 2019-11-05 ENCOUNTER — Encounter: Payer: Self-pay | Admitting: Licensed Clinical Social Worker
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
## 2019-11-25 ENCOUNTER — Other Ambulatory Visit: Payer: Self-pay

## 2019-11-25 ENCOUNTER — Ambulatory Visit (INDEPENDENT_AMBULATORY_CARE_PROVIDER_SITE_OTHER): Payer: No Typology Code available for payment source | Admitting: Licensed Clinical Social Worker

## 2019-11-25 DIAGNOSIS — F341 Dysthymic disorder: Secondary | ICD-10-CM | POA: Diagnosis not present

## 2019-11-25 DIAGNOSIS — F411 Generalized anxiety disorder: Secondary | ICD-10-CM | POA: Diagnosis not present

## 2019-11-26 ENCOUNTER — Encounter: Payer: Self-pay | Admitting: Licensed Clinical Social Worker

## 2019-11-26 NOTE — Progress Notes (Signed)
Virtual Visit via Telephone Note  I connected with Dana Benson on 11/26/19 at  3:30 PM EST by telephone and verified that I am speaking with the correct person using two identifiers.   I discussed the limitations, risks, security and privacy concerns of performing an evaluation and management service by telephone and the availability of in person appointments. I also discussed with the patient that there may be a patient responsible charge related to this service. The patient expressed understanding and agreed to proceed.    I discussed the assessment and treatment plan with the patient. The patient was provided an opportunity to ask questions and all were answered. The patient agreed with the plan and demonstrated an understanding of the instructions.   The patient was advised to call back or seek an in-person evaluation if the symptoms worsen or if the condition fails to improve as anticipated.  I provided 30 minutes of non-face-to-face time during this encounter.   Heidi Dach, LCSW    THERAPIST PROGRESS NOTE  Session Time: 1530  Participation Level: Active  Behavioral Response: NeatAlertAnxious  Type of Therapy: Individual Therapy  Treatment Goals addressed: Coping  Interventions: Supportive  Summary: Dana Benson is a 59 y.o. female who presents with continued symptoms related to her diagnosis. Jabree reports doing "about the same," since our last session. She reports continued lack of motivation to do things, "it's like I just can't make myself do it." LCSW validated Dinesha's feelings, and held space for her to discuss what issues this has presented in her life. We discussed how depression can decrease motivation to complete tasks, but it could also be an indication for the need of a medication adjustment. LCSW encouraged Desa to talk to her MD regarding this symptom, and discuss what options there are. LCSW suggested several ways Creasie can set small goals in order to  increase motivation moving forward. Euline expressed understanding and agreement with this information. Mallerie reported the primary change she's made recently is going on a no-carb diet. LCSW validated Lamees's feelings around her new plan, and encouraged her to pay attention to what her body is telling her and what it needs. LCSW suggested utilizing a nutritionist in order to ensure she is dieting in a healthy way. Oaklynn expressed understanding and agreement with this information.   Suicidal/Homicidal: No   Therapist Response: Pati continues to work towards her tx goals but has not yet reached them. We will continue to work on improving distress tolerance and building confidence.   Plan: Return again in 2 weeks.  Diagnosis: Axis I: Generalized Anxiety Disorder    Axis II: No diagnosis    Heidi Dach, LCSW 11/26/2019

## 2019-11-29 ENCOUNTER — Encounter: Payer: Self-pay | Admitting: Psychiatry

## 2019-11-29 ENCOUNTER — Other Ambulatory Visit: Payer: Self-pay

## 2019-11-29 ENCOUNTER — Ambulatory Visit (INDEPENDENT_AMBULATORY_CARE_PROVIDER_SITE_OTHER): Payer: No Typology Code available for payment source | Admitting: Psychiatry

## 2019-11-29 DIAGNOSIS — F411 Generalized anxiety disorder: Secondary | ICD-10-CM

## 2019-11-29 DIAGNOSIS — F900 Attention-deficit hyperactivity disorder, predominantly inattentive type: Secondary | ICD-10-CM | POA: Diagnosis not present

## 2019-11-29 DIAGNOSIS — F341 Dysthymic disorder: Secondary | ICD-10-CM | POA: Diagnosis not present

## 2019-11-29 MED ORDER — AMPHETAMINE-DEXTROAMPHETAMINE 20 MG PO TABS: 20.0000 mg | ORAL_TABLET | Freq: Two times a day (BID) | ORAL | 0 refills | Status: DC

## 2019-11-29 MED ORDER — ARIPIPRAZOLE 10 MG PO TABS: 10.0000 mg | ORAL_TABLET | Freq: Every day | ORAL | 1 refills | Status: DC

## 2019-11-29 NOTE — Progress Notes (Addendum)
Provider Location : [facility] Patient Location : Work  Virtual Visit via Telephone Note  I connected with Dana Benson on 11/29/19 at  2:00 PM EST by telephone and verified that I am speaking with the correct person using two identifiers.   I discussed the limitations, risks, security and privacy concerns of performing an evaluation and management service by telephone and the availability of in person appointments. I also discussed with the patient that there may be a patient responsible charge related to this service. The patient expressed understanding and agreed to proceed.    I discussed the assessment and treatment plan with the patient. The patient was provided an opportunity to ask questions and all were answered. The patient agreed with the plan and demonstrated an understanding of the instructions.   The patient was advised to call back or seek an in-person evaluation if the symptoms worsen or if the condition fails to improve as anticipated.  BH MD OP Progress Note  11/29/2019 2:18 PM Dana Benson  MRN:  [id number]  Chief Complaint:  Chief Complaint    Follow-up     HPI: Dana Benson is a 59 year old Caucasian female who is divorced, employed, lives in [place], has a history of MDD, ADHD, panic attacks was evaluated by phone today.  Patient preferred to do a phone call.  Patient today reports she is currently continuing to struggle with depressive symptoms.  She struggles with lack of motivation, feels like she wants to sleep all day and struggles with anhedonia.  She reports she looks at the mirror, and feels as though she cannot feel any emotions inside of her,when she looks at herself.  She reports sleep as good.  She reports she is currently back working full-time.  She stays busy throughout the day.  She is tired of sitting and looking at her computer all day and has not been able to take breaks which she would like to do.  She is currently following up with her therapist and  they are working on the same.  She denies any suicidality, homicidality or perceptual disturbances.  Patient denies any other concerns today.   Visit Diagnosis:    ICD-10-CM   1. Persistent depressive disorder with anxious distress, currently moderate  F34.1 ARIPiprazole (ABILIFY) 10 MG tablet  2. GAD (generalized anxiety disorder)  F41.1   3. Attention deficit hyperactivity disorder (ADHD), predominantly inattentive type  F90.0 amphetamine-dextroamphetamine (ADDERALL) 20 MG tablet    Past Psychiatric History: I have reviewed past psychiatric history from my progress note on 01/08/2018.  Past trials of Effexor, Paxil, Wellbutrin, Prozac, Lexapro, Cymbalta, Celexa  Past Medical History:  Past Medical History:  Diagnosis Date  . ADD (attention deficit disorder with hyperactivity)   . Allergic rhinitis   . Anxiety   . Depression   . GERD (gastroesophageal reflux disease)   . History of chicken pox     Past Surgical History:  Procedure Laterality Date  . APPENDECTOMY  1979  . BLADDER SUSPENSION  1995   during hysterectomy  . CHOLECYSTECTOMY  1985  . TONSILLECTOMY  1970  . VAGINAL HYSTERECTOMY  1995    Family Psychiatric History: I have reviewed family psychiatric history from my progress note on 01/08/2018.  Family History:  Family History  Problem Relation Age of Onset  . Depression Mother   . Hypertension Mother   . Diabetes Mother   . Depression Sister   . Anxiety disorder Sister   . ADD / ADHD Sister   .  Depression Son   . Depression Maternal Aunt   . Breast cancer Maternal Aunt 35  . Breast cancer Maternal Grandmother 60  . Breast cancer Cousin 62  . Breast cancer Maternal Aunt 56    Social History: I have reviewed social history from my progress note on 01/08/2018. Social History   Socioeconomic History  . Marital status: Single    Spouse name: Not on file  . Number of children: 3  . Years of education: Not on file  . Highest education level: Not on file   Occupational History    Employer: [employer]  Tobacco Use  . Smoking status: Current Every Day Smoker    Packs/day: 0.50    Years: 20.00    Pack years: 10.00    Types: Cigarettes  . Smokeless tobacco: Never Used  . Tobacco comment: 2 packs a week  Substance and Sexual Activity  . Alcohol use: Yes    Alcohol/week: 1.0 - 2.0 standard drinks    Types: 1 - 2 Glasses of wine per week    Comment: Wine HS occasional  . Drug use: No  . Sexual activity: Never  Other Topics Concern  . Not on file  Social History Narrative   Lives in [place]. Works at [employer].   Social Determinants of Health   Financial Resource Strain:   . Difficulty of Paying Living Expenses: Not on file  Food Insecurity:   . Worried About Charity fundraiser in the Last Year: Not on file  . Ran Out of Food in the Last Year: Not on file  Transportation Needs:   . Lack of Transportation (Medical): Not on file  . Lack of Transportation (Non-Medical): Not on file  Physical Activity:   . Days of Exercise per Week: Not on file  . Minutes of Exercise per Session: Not on file  Stress:   . Feeling of Stress : Not on file  Social Connections:   . Frequency of Communication with Friends and Family: Not on file  . Frequency of Social Gatherings with Friends and Family: Not on file  . Attends Religious Services: Not on file  . Active Member of Clubs or Organizations: Not on file  . Attends Archivist Meetings: Not on file  . Marital Status: Not on file    Allergies:  Allergies  Allergen Reactions  . Prozac [Fluoxetine Hcl] Other (See Comments)    GI ISSUES  . Trazodone And Nefazodone     nightmares  . Wellbutrin [Bupropion] Other (See Comments)    Dizziness and ? Hearing loss  . Prednisone Anxiety    Metabolic Disorder Labs: Lab Results  Component Value Date   HGBA1C 6.2 (H) 09/02/2019   MPG 131.24 09/02/2019   MPG 119.76 03/02/2018   No results found for: PROLACTIN Lab Results  Component Value  Date   CHOL 242 (H) 09/02/2019   TRIG 92 09/02/2019   HDL 67 09/02/2019   CHOLHDL 3.6 09/02/2019   VLDL 18 09/02/2019   LDLCALC 157 (H) 09/02/2019   LDLCALC 152 (H) 03/02/2018   Lab Results  Component Value Date   TSH 1.406 09/02/2019   TSH 1.159 03/02/2018    Therapeutic Level Labs: No results found for: LITHIUM No results found for: VALPROATE No components found for:  CBMZ  Current Medications: Current Outpatient Medications  Medication Sig Dispense Refill  . amphetamine-dextroamphetamine (ADDERALL) 20 MG tablet Take 1 tablet (20 mg total) by mouth 2 (two) times daily. 60 tablet 0  . [  START ON 12/10/2019] amphetamine-dextroamphetamine (ADDERALL) 20 MG tablet Take 1 tablet (20 mg total) by mouth 2 (two) times daily. 60 tablet 0  . ARIPiprazole (ABILIFY) 10 MG tablet Take 1 tablet (10 mg total) by mouth daily. 30 tablet 1  . busPIRone (BUSPAR) 10 MG tablet Take 2 tablets (20 mg total) by mouth 2 (two) times daily. 360 tablet 1  . cetirizine (ZYRTEC) 10 MG tablet Take 10 mg by mouth daily.    . clonazePAM (KLONOPIN) 0.5 MG tablet Take 0.5-1 tablets (0.25-0.5 mg total) by mouth at bedtime as needed for anxiety. 26 tablet 2  . cyanocobalamin (,VITAMIN B-12,) 1000 MCG/ML injection Inject 1 mL (1,000 mcg total) into the muscle every 30 (thirty) days. 1 mL 11  . cyanocobalamin (,VITAMIN B-12,) 1000 MCG/ML injection Inject 1 mL (1,000 mcg total) into the muscle once. 1 mL 0  . fluticasone (FLONASE) 50 MCG/ACT nasal spray Place 2 sprays into both nostrils daily. 16 g 11  . Linaclotide (LINZESS) 290 MCG CAPS capsule Take 1 capsule (290 mcg total) by mouth daily. 30 capsule 6  . ondansetron (ZOFRAN) 8 MG tablet Take 1 tablet (8 mg total) by mouth every 8 (eight) hours as needed for nausea or vomiting. 30 tablet 3  . pantoprazole (PROTONIX) 40 MG tablet   5  . traZODone (DESYREL) 100 MG tablet Take 1 tablet (100 mg total) by mouth at bedtime. 90 tablet 2  . traZODone (DESYREL) 50 MG tablet  Take 1 tablet (50 mg total) by mouth at bedtime as needed for sleep. To be combined with 100 mg 90 tablet 2  . vortioxetine HBr (TRINTELLIX) 20 MG TABS tablet Take 1 tablet (20 mg total) by mouth daily. 90 tablet 1   Current Facility-Administered Medications  Medication Dose Route Frequency Provider Last Rate Last Admin  . cyanocobalamin ((VITAMIN B-12)) injection 1,000 mcg  1,000 mcg Intramuscular Once Sherrie Mustache Roselyn Bering, PA-C         Musculoskeletal: Strength & Muscle Tone: UTA Gait & Station: Reports as WNL Patient leans: N/A  Psychiatric Specialty Exam: Review of Systems  Psychiatric/Behavioral: Positive for dysphoric mood.  All other systems reviewed and are negative.   There were no vitals taken for this visit.There is no height or weight on file to calculate BMI.  General Appearance: UTA  Eye Contact:  UTA  Speech:  Clear and Coherent  Volume:  Normal  Mood:  Depressed  Affect:  UTA  Thought Process:  Goal Directed and Descriptions of Associations: Intact  Orientation:  Full (Time, Place, and Person)  Thought Content: Logical   Suicidal Thoughts:  No  Homicidal Thoughts:  No  Memory:  Immediate;   Fair Recent;   Fair Remote;   Fair  Judgement:  Fair  Insight:  Fair  Psychomotor Activity:  Normal  Concentration:  Concentration: Fair and Attention Span: Fair  Recall:  [employer] of Knowledge: Fair  Language: Fair  Akathisia:  No  Handed:  Right  AIMS (if indicated): UTA  Assets:  Communication Skills Desire for Improvement Housing Social Support  ADL's:  Intact  Cognition: WNL  Sleep:  Fair   Screenings: PHQ2-9     Office Visit from 03/13/2016 in [facility] Office Visit from 08/20/2012 in [facility]  PHQ-2 Total Score  0  0       Assessment and Plan: Dana Benson is a 59 year old Caucasian female who has a history of depression, ADHD was evaluated by phone today.  Patient  continues to struggle with depressive symptoms.   She will benefit from medication readjustment and continued psychotherapy sessions.  Also discussed alternative treatment with patient like ECT referral as well as TMS referral.  Patient however declines.  Plan Depression-unstable Trintellix 20 mg p.o. daily BuSpar 20 mg p.o. twice daily Increase Abilify to 10 mg p.o. daily Continue psychotherapy sessions with Ms. Tasia Catchings  Insomnia-stable Trazodone 150 mg p.o. nightly as needed   Panic attacks-stable BuSpar as prescribed Klonopin 0.25 mg as needed-patient advised to limit use. I have reviewed [place] controlled substance database.  ADHD-stable Adderall 20 mg p.o. twice daily Provided 1 prescription with date specified to be picked up on 12/10/2019. I have reviewed [place] controlled substance database.  Tobacco use disorder-unstable We will monitor closely  Follow-up in clinic in 3 weeks or sooner if needed.    I have spent atleast 20 minutes non face to face with patient today. More than 50 % of the time was spent for ordering medications and test ,psychoeducation and supportive psychotherapy and care coordination,as well as documenting clinical information in electronic health record. This note was generated in part or whole with voice recognition software. Voice recognition is usually quite accurate but there are transcription errors that can and very often do occur. I apologize for any typographical errors that were not detected and corrected.      Jomarie Longs, MD 11/29/2019, 2:18 PM

## 2019-12-14 ENCOUNTER — Other Ambulatory Visit: Payer: Self-pay

## 2019-12-14 ENCOUNTER — Ambulatory Visit (INDEPENDENT_AMBULATORY_CARE_PROVIDER_SITE_OTHER): Payer: No Typology Code available for payment source | Admitting: Licensed Clinical Social Worker

## 2019-12-14 DIAGNOSIS — F341 Dysthymic disorder: Secondary | ICD-10-CM | POA: Diagnosis not present

## 2019-12-14 DIAGNOSIS — F411 Generalized anxiety disorder: Secondary | ICD-10-CM | POA: Diagnosis not present

## 2019-12-15 ENCOUNTER — Encounter: Payer: Self-pay | Admitting: Licensed Clinical Social Worker

## 2019-12-15 NOTE — Progress Notes (Signed)
Virtual Visit via Video Note  I connected with Ellin Mayhew on 12/15/19 at  3:30 PM EDT by a video enabled telemedicine application and verified that I am speaking with the correct person using two identifiers.   I discussed the limitations of evaluation and management by telemedicine and the availability of in person appointments. The patient expressed understanding and agreed to proceed.    I discussed the assessment and treatment plan with the patient. The patient was provided an opportunity to ask questions and all were answered. The patient agreed with the plan and demonstrated an understanding of the instructions.   The patient was advised to call back or seek an in-person evaluation if the symptoms worsen or if the condition fails to improve as anticipated.  I provided 30 minutes of non-face-to-face time during this encounter.   Heidi Dach, LCSW    THERAPIST PROGRESS NOTE  Session Time:1530  Participation Level: Active  Behavioral Response: CasualAlertAnxious  Type of Therapy: Individual Therapy  Treatment Goals addressed: Coping  Interventions: Supportive  Summary: EILEE SCHADER is a 59 y.o. female who presents with continued symptoms related to her diagnosis. Shelanda reports doing well since our last session, but noted some things feel they have not changed. Lyndsey reports she still does not want to go out or see anyone, but has been forcing herself to go to work and run errands when she needs to. LCSW validated these feelings and asked her if she was able to speak with MD about this. Haleemah reported she was able to speak with Dr. Elna Breslow about this issue, and MD was able to increase her medication and they will check in next week to determine if she is on the right medications or if there needs to be a change. LCSW validated these ideas and encouraged Shermaine to continue being open with her provider as much as possible. We also dicussed more grounding techniques Maaliyah could  do in order to prepare herself to leave the home when she is feeling anxious. Stela expressed because she is at work, she does not have the opportunity to talk about the things she would like to talk about with LCSW, so asked if we could change her time slot to avoid this. LCSW was in agreement.  Suicidal/Homicidal: No  Therapist Response: Skylin continues to work towards her tx goals but has not yet reached them. We will continue to work on improving emotional regulation skills and distress tolerance moving forward.  Plan: Return again in 2 weeks.  Diagnosis: Axis I: Generalized Anxiety Disorder    Axis II: No diagnosis    Heidi Dach, LCSW 12/15/2019

## 2019-12-20 ENCOUNTER — Encounter: Payer: Self-pay | Admitting: Psychiatry

## 2019-12-20 ENCOUNTER — Ambulatory Visit (INDEPENDENT_AMBULATORY_CARE_PROVIDER_SITE_OTHER): Payer: No Typology Code available for payment source | Admitting: Psychiatry

## 2019-12-20 ENCOUNTER — Other Ambulatory Visit: Payer: Self-pay

## 2019-12-20 DIAGNOSIS — F900 Attention-deficit hyperactivity disorder, predominantly inattentive type: Secondary | ICD-10-CM | POA: Diagnosis not present

## 2019-12-20 DIAGNOSIS — F341 Dysthymic disorder: Secondary | ICD-10-CM

## 2019-12-20 DIAGNOSIS — F411 Generalized anxiety disorder: Secondary | ICD-10-CM

## 2019-12-20 MED ORDER — AMPHETAMINE-DEXTROAMPHETAMINE 20 MG PO TABS: 20.0000 mg | ORAL_TABLET | Freq: Two times a day (BID) | ORAL | 0 refills | Status: DC

## 2019-12-20 MED ORDER — REXULTI 0.5 MG PO TABS: 0.5000 mg | ORAL_TABLET | Freq: Every day | ORAL | 1 refills | Status: DC

## 2019-12-20 NOTE — Progress Notes (Signed)
Provider Location : [facility] Patient Location : Home  Virtual Visit via Telephone Note  I connected with Dana Benson on 12/20/19 at  4:00 PM EDT by telephone and verified that I am speaking with the correct person using two identifiers.   I discussed the limitations, risks, security and privacy concerns of performing an evaluation and management service by telephone and the availability of in person appointments. I also discussed with the patient that there may be a patient responsible charge related to this service. The patient expressed understanding and agreed to proceed.     I discussed the assessment and treatment plan with the patient. The patient was provided an opportunity to ask questions and all were answered. The patient agreed with the plan and demonstrated an understanding of the instructions.   The patient was advised to call back or seek an in-person evaluation if the symptoms worsen or if the condition fails to improve as anticipated.   BH MD OP Progress Note  12/20/2019 6:18 PM Dana Benson  MRN:  [id number]  Chief Complaint:  Chief Complaint    Follow-up     HPI: Dana Benson is a 58 year old Caucasian female who is divorced, employed, lives in [place], has a history of MDD, ADHD, panic attacks was evaluated by phone today.  Patient preferred to do a phone call.  Patient today reports she is currently struggling with depressive symptoms.  She struggles with lack of motivation, excessive sleep during the day and anhedonia.  She reports she has not noticed much benefit from the recent medication changes.  She is currently taking Abilify 10 mg and that has not helped.  She denies side effects.  Patient reports she continues to work full-time.  She reports she is able to focus at work.  She is compliant on her Adderall and denies side effects.  Patient denies any suicidality, homicidality or perceptual disturbances.  Patient denies any other concerns today. Visit  Diagnosis:    ICD-10-CM   1. Persistent depressive disorder with anxious distress, currently moderate  F34.1 Brexpiprazole (REXULTI) 0.5 MG TABS  2. GAD (generalized anxiety disorder)  F41.1   3. Attention deficit hyperactivity disorder (ADHD), predominantly inattentive type  F90.0 amphetamine-dextroamphetamine (ADDERALL) 20 MG tablet    Past Psychiatric History: Reviewed past psychiatric history from my progress note on 01/08/2018.  Past trials of Effexor, Paxil, Wellbutrin, Prozac, Lexapro, Cymbalta, Celexa.  Past Medical History:  Past Medical History:  Diagnosis Date  . ADD (attention deficit disorder with hyperactivity)   . Allergic rhinitis   . Anxiety   . Depression   . GERD (gastroesophageal reflux disease)   . History of chicken pox     Past Surgical History:  Procedure Laterality Date  . APPENDECTOMY  1979  . BLADDER SUSPENSION  1995   during hysterectomy  . CHOLECYSTECTOMY  1985  . TONSILLECTOMY  1970  . VAGINAL HYSTERECTOMY  1995    Family Psychiatric History: I have reviewed family psychiatric history from my progress note on 01/08/2018.  Family History:  Family History  Problem Relation Age of Onset  . Depression Mother   . Hypertension Mother   . Diabetes Mother   . Depression Sister   . Anxiety disorder Sister   . ADD / ADHD Sister   . Depression Son   . Depression Maternal Aunt   . Breast cancer Maternal Aunt 44  . Breast cancer Maternal Grandmother 60  . Breast cancer Cousin 44  . Breast cancer Maternal Aunt 38  Social History: Reviewed social history from my progress note on 01/08/2018. Social History   Socioeconomic History  . Marital status: Single    Spouse name: Not on file  . Number of children: 3  . Years of education: Not on file  . Highest education level: Not on file  Occupational History    Employer: [employer]  Tobacco Use  . Smoking status: Current Every Day Smoker    Packs/day: 0.50    Years: 20.00    Pack years: 10.00    Types:  Cigarettes  . Smokeless tobacco: Never Used  . Tobacco comment: 2 packs a week  Substance and Sexual Activity  . Alcohol use: Yes    Alcohol/week: 1.0 - 2.0 standard drinks    Types: 1 - 2 Glasses of wine per week    Comment: Wine HS occasional  . Drug use: No  . Sexual activity: Never  Other Topics Concern  . Not on file  Social History Narrative   Lives in [place]. Works at [employer].   Social Determinants of Health   Financial Resource Strain:   . Difficulty of Paying Living Expenses:   Food Insecurity:   . Worried About Charity fundraiser in the Last Year:   . Arboriculturist in the Last Year:   Transportation Needs:   . Film/video editor (Medical):   Marland Kitchen Lack of Transportation (Non-Medical):   Physical Activity:   . Days of Exercise per Week:   . Minutes of Exercise per Session:   Stress:   . Feeling of Stress :   Social Connections:   . Frequency of Communication with Friends and Family:   . Frequency of Social Gatherings with Friends and Family:   . Attends Religious Services:   . Active Member of Clubs or Organizations:   . Attends Archivist Meetings:   Marland Kitchen Marital Status:     Allergies:  Allergies  Allergen Reactions  . Prozac [Fluoxetine Hcl] Other (See Comments)    GI ISSUES  . Trazodone And Nefazodone     nightmares  . Wellbutrin [Bupropion] Other (See Comments)    Dizziness and ? Hearing loss  . Prednisone Anxiety    Metabolic Disorder Labs: Lab Results  Component Value Date   HGBA1C 6.2 (H) 09/02/2019   MPG 131.24 09/02/2019   MPG 119.76 03/02/2018   No results found for: PROLACTIN Lab Results  Component Value Date   CHOL 242 (H) 09/02/2019   TRIG 92 09/02/2019   HDL 67 09/02/2019   CHOLHDL 3.6 09/02/2019   VLDL 18 09/02/2019   LDLCALC 157 (H) 09/02/2019   LDLCALC 152 (H) 03/02/2018   Lab Results  Component Value Date   TSH 1.406 09/02/2019   TSH 1.159 03/02/2018    Therapeutic Level Labs: No results found for:  LITHIUM No results found for: VALPROATE No components found for:  CBMZ  Current Medications: Current Outpatient Medications  Medication Sig Dispense Refill  . amphetamine-dextroamphetamine (ADDERALL) 20 MG tablet Take 1 tablet (20 mg total) by mouth 2 (two) times daily. 60 tablet 0  . [START ON 01/07/2020] amphetamine-dextroamphetamine (ADDERALL) 20 MG tablet Take 1 tablet (20 mg total) by mouth 2 (two) times daily. 60 tablet 0  . Brexpiprazole (REXULTI) 0.5 MG TABS Take 1 tablet (0.5 mg total) by mouth at bedtime. 30 tablet 1  . busPIRone (BUSPAR) 10 MG tablet Take 2 tablets (20 mg total) by mouth 2 (two) times daily. 360 tablet 1  . cetirizine (ZYRTEC)  10 MG tablet Take 10 mg by mouth daily.    . clonazePAM (KLONOPIN) 0.5 MG tablet Take 0.5-1 tablets (0.25-0.5 mg total) by mouth at bedtime as needed for anxiety. 26 tablet 2  . cyanocobalamin (,VITAMIN B-12,) 1000 MCG/ML injection Inject 1 mL (1,000 mcg total) into the muscle every 30 (thirty) days. 1 mL 11  . cyanocobalamin (,VITAMIN B-12,) 1000 MCG/ML injection Inject 1 mL (1,000 mcg total) into the muscle once. 1 mL 0  . fluticasone (FLONASE) 50 MCG/ACT nasal spray Place 2 sprays into both nostrils daily. 16 g 11  . Linaclotide (LINZESS) 290 MCG CAPS capsule Take 1 capsule (290 mcg total) by mouth daily. 30 capsule 6  . ondansetron (ZOFRAN) 8 MG tablet Take 1 tablet (8 mg total) by mouth every 8 (eight) hours as needed for nausea or vomiting. 30 tablet 3  . pantoprazole (PROTONIX) 40 MG tablet   5  . traZODone (DESYREL) 100 MG tablet Take 1 tablet (100 mg total) by mouth at bedtime. 90 tablet 2  . traZODone (DESYREL) 50 MG tablet Take 1 tablet (50 mg total) by mouth at bedtime as needed for sleep. To be combined with 100 mg 90 tablet 2  . vortioxetine HBr (TRINTELLIX) 20 MG TABS tablet Take 1 tablet (20 mg total) by mouth daily. 90 tablet 1   Current Facility-Administered Medications  Medication Dose Route Frequency Provider Last Rate Last  Admin  . cyanocobalamin ((VITAMIN B-12)) injection 1,000 mcg  1,000 mcg Intramuscular Once Faythe Ghee, PA-C         Musculoskeletal: Strength & Muscle Tone: UTA Gait & Station: UTA Patient leans: N/A  Psychiatric Specialty Exam: Review of Systems  Psychiatric/Behavioral: Positive for dysphoric mood.  All other systems reviewed and are negative.   There were no vitals taken for this visit.There is no height or weight on file to calculate BMI.  General Appearance: UTA  Eye Contact:  UTA  Speech:  Clear and Coherent  Volume:  Normal  Mood:  Depressed  Affect:  UTA  Thought Process:  Goal Directed and Descriptions of Associations: Intact  Orientation:  Full (Time, Place, and Person)  Thought Content: Logical   Suicidal Thoughts:  No  Homicidal Thoughts:  No  Memory:  Immediate;   Fair Recent;   Fair Remote;   Fair  Judgement:  Fair  Insight:  Fair  Psychomotor Activity:  UTA  Concentration:  Concentration: Fair and Attention Span: Fair  Recall:  [employer] of Knowledge: Fair  Language: Fair  Akathisia:  No  Handed:  Right  AIMS (if indicated): UTA  Assets:  Communication Skills Desire for Improvement Housing Social Support  ADL's:  Intact  Cognition: WNL  Sleep:  Fair   Screenings: PHQ2-9     Office Visit from 03/13/2016 in [facility] Office Visit from 08/20/2012 in [facility]  PHQ-2 Total Score  0  0       Assessment and Plan: Dana Benson is a 59 year old Caucasian female who has a history of depression, ADHD was evaluated by phone today.  Patient continues to struggle with depressive symptoms and has not noticed much benefit from the Abilify.  Plan as noted below.  Plan Depression-unstable Continue Trintellix 20 mg p.o. daily BuSpar 20 mg p.o. twice daily Discontinue Abilify  for lack of benefit. Start Rexulti 0.5 mg p.o. nightly. Continue psychotherapy sessions with Ms. Tasia Catchings.  Insomnia-stable Trazodone 150 mg  p.o. nightly as needed  Panic attacks-stable BuSpar as prescribed  Klonopin 0.25 mg as needed for severe anxiety attacks.  Patient advised to limit use I have reviewed [place] controlled substance database  ADHD-stable Adderall 20 mg p.o. twice daily I have provided 1 prescription with date specified to be picked up in April. I have reviewed [place] controlled substance database  Tobacco use disorder-unstable Will monitor closely.  Provided counseling.  Patient was referred for TMS in the past however has been noncompliant.  Will encourage referral for alternate of treatment like TMS if she continues to struggle.  Follow-up in clinic in 3 weeks or sooner if needed.  I have spent atleast 20 minutes non face to face with patient today. More than 50 % of the time was spent for ordering medications and test ,psychoeducation and supportive psychotherapy and care coordination,as well as documenting clinical information in electronic health record. This note was generated in part or whole with voice recognition software. Voice recognition is usually quite accurate but there are transcription errors that can and very often do occur. I apologize for any typographical errors that were not detected and corrected.        Jomarie Longs, MD 12/20/2019, 6:18 PM

## 2020-01-05 ENCOUNTER — Ambulatory Visit: Payer: No Typology Code available for payment source | Admitting: Licensed Clinical Social Worker

## 2020-01-05 ENCOUNTER — Other Ambulatory Visit: Payer: Self-pay

## 2020-01-18 ENCOUNTER — Encounter: Payer: Self-pay | Admitting: Psychiatry

## 2020-01-18 ENCOUNTER — Telehealth (INDEPENDENT_AMBULATORY_CARE_PROVIDER_SITE_OTHER): Payer: No Typology Code available for payment source | Admitting: Psychiatry

## 2020-01-18 ENCOUNTER — Other Ambulatory Visit: Payer: Self-pay

## 2020-01-18 DIAGNOSIS — F411 Generalized anxiety disorder: Secondary | ICD-10-CM | POA: Diagnosis not present

## 2020-01-18 DIAGNOSIS — F341 Dysthymic disorder: Secondary | ICD-10-CM | POA: Diagnosis not present

## 2020-01-18 DIAGNOSIS — F410 Panic disorder [episodic paroxysmal anxiety] without agoraphobia: Secondary | ICD-10-CM | POA: Diagnosis not present

## 2020-01-18 DIAGNOSIS — F900 Attention-deficit hyperactivity disorder, predominantly inattentive type: Secondary | ICD-10-CM

## 2020-01-18 MED ORDER — BUSPIRONE HCL 10 MG PO TABS: 20.0000 mg | ORAL_TABLET | Freq: Two times a day (BID) | ORAL | 0 refills | Status: DC

## 2020-01-18 MED ORDER — VORTIOXETINE HBR 20 MG PO TABS: 20.0000 mg | ORAL_TABLET | Freq: Every day | ORAL | 0 refills | Status: DC

## 2020-01-18 MED ORDER — BREXPIPRAZOLE 1 MG PO TABS: 1.0000 mg | ORAL_TABLET | Freq: Every day | ORAL | 0 refills | Status: DC

## 2020-01-18 NOTE — Progress Notes (Signed)
Provider Location : [facility] Patient Location : Home  Virtual Visit via Video Note  I connected with Dana Benson on 01/18/20 at  4:30 PM EDT by a video enabled telemedicine application and verified that I am speaking with the correct person using two identifiers.   I discussed the limitations of evaluation and management by telemedicine and the availability of in person appointments. The patient expressed understanding and agreed to proceed.     I discussed the assessment and treatment plan with the patient. The patient was provided an opportunity to ask questions and all were answered. The patient agreed with the plan and demonstrated an understanding of the instructions.   The patient was advised to call back or seek an in-person evaluation if the symptoms worsen or if the condition fails to improve as anticipated.  BH MD OP Progress Note  01/18/2020 5:04 PM Dana Benson  MRN:  [id number]  Chief Complaint:  Chief Complaint    Follow-up     HPI: Dana Benson is a 59 year old Caucasian female who is divorced, employed, lives in [place], has a history of MDD, ADHD, panic attacks was evaluated by telemedicine today.  Patient today reported that she does not like doing video calls and hence part of the session was done by video and it was changed to just audio during the session.  Patient today reports she continues to struggle with depressive symptoms.  She struggles with lack of motivation, excessive sleep and anhedonia.  She denies any suicidality.  She reports she has to push herself to do anything on a daily basis.  She reports sleep is excessive.  She reports she sleeps around 10 to 12 hours on and off.  She continues to take trazodone 150 mg.  Discussed with patient to reduce the dosage of trazodone if she is sleeping excessively.  She is currently taking Rexulti as prescribed.  She denies side effects.  She however reports she has not noticed much benefit from the current dosage  of Rexulti.  She is compliant on Adderall.  She is also compliant on all the other medications and denies side effects.  Patient denies any other concerns today.     Visit Diagnosis:    ICD-10-CM   1. Persistent depressive disorder with anxious distress, currently moderate  F34.1 brexpiprazole (REXULTI) 1 MG TABS tablet  2. GAD (generalized anxiety disorder)  F41.1 vortioxetine HBr (TRINTELLIX) 20 MG TABS tablet    busPIRone (BUSPAR) 10 MG tablet  3. Attention deficit hyperactivity disorder (ADHD), predominantly inattentive type  F90.0   4. Panic attack  F41.0     Past Psychiatric History: I have reviewed past psychiatric history from my progress note on 01/08/2018.  Past trials of Effexor, Paxil, Wellbutrin, Prozac, Lexapro, Cymbalta, Celexa.  Past Medical History:  Past Medical History:  Diagnosis Date  . ADD (attention deficit disorder with hyperactivity)   . Allergic rhinitis   . Anxiety   . Depression   . GERD (gastroesophageal reflux disease)   . History of chicken pox     Past Surgical History:  Procedure Laterality Date  . APPENDECTOMY  1979  . BLADDER SUSPENSION  1995   during hysterectomy  . CHOLECYSTECTOMY  1985  . TONSILLECTOMY  1970  . VAGINAL HYSTERECTOMY  1995    Family Psychiatric History: I have reviewed family psychiatric history from my progress note on 01/08/2018  Family History:  Family History  Problem Relation Age of Onset  . Depression Mother   . Hypertension Mother   .  Diabetes Mother   . Depression Sister   . Anxiety disorder Sister   . ADD / ADHD Sister   . Depression Son   . Depression Maternal Aunt   . Breast cancer Maternal Aunt 44  . Breast cancer Maternal Grandmother 60  . Breast cancer Cousin 44  . Breast cancer Maternal Aunt 83    Social History: Reviewed social history from my progress note on 01/08/2018 Social History   Socioeconomic History  . Marital status: Single    Spouse name: Not on file  . Number of children: 3   . Years of education: Not on file  . Highest education level: Not on file  Occupational History    Employer: [employer]  Tobacco Use  . Smoking status: Current Every Day Smoker    Packs/day: 0.50    Years: 20.00    Pack years: 10.00    Types: Cigarettes  . Smokeless tobacco: Never Used  . Tobacco comment: 2 packs a week  Substance and Sexual Activity  . Alcohol use: Yes    Alcohol/week: 1.0 - 2.0 standard drinks    Types: 1 - 2 Glasses of wine per week    Comment: Wine HS occasional  . Drug use: No  . Sexual activity: Never  Other Topics Concern  . Not on file  Social History Narrative   Lives in [place]. Works at [employer].   Social Determinants of Health   Financial Resource Strain:   . Difficulty of Paying Living Expenses:   Food Insecurity:   . Worried About Programme researcher, broadcasting/film/video in the Last Year:   . Barista in the Last Year:   Transportation Needs:   . Freight forwarder (Medical):   Marland Kitchen Lack of Transportation (Non-Medical):   Physical Activity:   . Days of Exercise per Week:   . Minutes of Exercise per Session:   Stress:   . Feeling of Stress :   Social Connections:   . Frequency of Communication with Friends and Family:   . Frequency of Social Gatherings with Friends and Family:   . Attends Religious Services:   . Active Member of Clubs or Organizations:   . Attends Banker Meetings:   Marland Kitchen Marital Status:     Allergies:  Allergies  Allergen Reactions  . Prozac [Fluoxetine Hcl] Other (See Comments)    GI ISSUES  . Trazodone And Nefazodone     nightmares  . Wellbutrin [Bupropion] Other (See Comments)    Dizziness and ? Hearing loss  . Prednisone Anxiety    Metabolic Disorder Labs: Lab Results  Component Value Date   HGBA1C 6.2 (H) 09/02/2019   MPG 131.24 09/02/2019   MPG 119.76 03/02/2018   No results found for: PROLACTIN Lab Results  Component Value Date   CHOL 242 (H) 09/02/2019   TRIG 92 09/02/2019   HDL 67 09/02/2019    CHOLHDL 3.6 09/02/2019   VLDL 18 09/02/2019   LDLCALC 157 (H) 09/02/2019   LDLCALC 152 (H) 03/02/2018   Lab Results  Component Value Date   TSH 1.406 09/02/2019   TSH 1.159 03/02/2018    Therapeutic Level Labs: No results found for: LITHIUM No results found for: VALPROATE No components found for:  CBMZ  Current Medications: Current Outpatient Medications  Medication Sig Dispense Refill  . amphetamine-dextroamphetamine (ADDERALL) 20 MG tablet Take 1 tablet (20 mg total) by mouth 2 (two) times daily. 60 tablet 0  . amphetamine-dextroamphetamine (ADDERALL) 20 MG tablet Take 1  tablet (20 mg total) by mouth 2 (two) times daily. 60 tablet 0  . brexpiprazole (REXULTI) 1 MG TABS tablet Take 1-2 tablets (1-2 mg total) by mouth at bedtime. To start taking 1 mg daily for 15 days and increase to 2 mg daily after that. 45 tablet 0  . busPIRone (BUSPAR) 10 MG tablet Take 2 tablets (20 mg total) by mouth 2 (two) times daily. 360 tablet 0  . cetirizine (ZYRTEC) 10 MG tablet Take 10 mg by mouth daily.    . clonazePAM (KLONOPIN) 0.5 MG tablet Take 0.5-1 tablets (0.25-0.5 mg total) by mouth at bedtime as needed for anxiety. 26 tablet 2  . cyanocobalamin (,VITAMIN B-12,) 1000 MCG/ML injection Inject 1 mL (1,000 mcg total) into the muscle every 30 (thirty) days. 1 mL 11  . cyanocobalamin (,VITAMIN B-12,) 1000 MCG/ML injection Inject 1 mL (1,000 mcg total) into the muscle once. 1 mL 0  . fluticasone (FLONASE) 50 MCG/ACT nasal spray Place 2 sprays into both nostrils daily. 16 g 11  . Linaclotide (LINZESS) 290 MCG CAPS capsule Take 1 capsule (290 mcg total) by mouth daily. 30 capsule 6  . ondansetron (ZOFRAN) 8 MG tablet Take 1 tablet (8 mg total) by mouth every 8 (eight) hours as needed for nausea or vomiting. 30 tablet 3  . pantoprazole (PROTONIX) 40 MG tablet   5  . traZODone (DESYREL) 100 MG tablet Take 1 tablet (100 mg total) by mouth at bedtime. 90 tablet 2  . traZODone (DESYREL) 50 MG tablet Take 1  tablet (50 mg total) by mouth at bedtime as needed for sleep. To be combined with 100 mg 90 tablet 2  . vortioxetine HBr (TRINTELLIX) 20 MG TABS tablet Take 1 tablet (20 mg total) by mouth daily. 90 tablet 0   Current Facility-Administered Medications  Medication Dose Route Frequency Provider Last Rate Last Admin  . cyanocobalamin ((VITAMIN B-12)) injection 1,000 mcg  1,000 mcg Intramuscular Once Caryn Section Linden Dolin, PA-C         Musculoskeletal: Strength & Muscle Tone: [place]: normal Patient leans: N/A  Psychiatric Specialty Exam: Review of Systems  Psychiatric/Behavioral: Positive for dysphoric mood.  All other systems reviewed and are negative.   There were no vitals taken for this visit.There is no height or weight on file to calculate BMI.  General Appearance: Casual  Eye Contact:  Fair  Speech:  Clear and Coherent  Volume:  Normal  Mood:  Depressed  Affect:  Congruent  Thought Process:  Goal Directed and Descriptions of Associations: Intact  Orientation:  Full (Time, Place, and Person)  Thought Content: Logical   Suicidal Thoughts:  No  Homicidal Thoughts:  No  Memory:  Immediate;   Fair Recent;   Fair Remote;   Fair  Judgement:  Fair  Insight:  Fair  Psychomotor Activity:  Normal  Concentration:  Concentration: Fair and Attention Span: Fair  Recall:  [employer] of Knowledge: Fair  Language: Fair  Akathisia:  No  Handed:  Right  AIMS (if indicated): UTA  Assets:  Communication Skills Desire for [place] Talents/Skills Transportation Vocational/Educational  ADL's:  Intact  Cognition: WNL  Sleep:  Excessive   Screenings: PHQ2-9     Office Visit from 03/13/2016 in [place] Office Visit from 08/20/2012 in [place]  PHQ-2 Total Score  0  0       Assessment and Plan: Dana Benson is a 59 year old Caucasian female who has a history  of depression, ADHD was evaluated by telemedicine  today.  Patient continues to struggle with depressive symptoms.  She does have psychosocial stressors of the pandemic, being divorced, death of her son and job-related stressors.  Patient will continue to benefit from medication readjustment.  Plan as noted below.  Plan Depression-unstable Continue Trintellix 20 mg p.o. daily BuSpar 20 mg p.o. twice daily Increase Rexulti to 1 mg p.o. nightly for 15 days and then to 2 mg p.o. nightly. Patient is aware that her therapist has left the practice and will establish care with a new therapist.   Sleep problems-unstable She currently reports excessive sleep.  Discussed with patient to reduce trazodone to 125 mg p.o. nightly as needed. If she continues to have excessive sleep she could reduce it to 100 mg p.o. nightly.  Panic attacks-stable BuSpar as prescribed Klonopin 0.25 mg as needed for severe anxiety attacks. I have reviewed [place] controlled substance database.  ADHD-stable Adderall 20 mg p.o. twice daily I have reviewed [place] controlled substance database.  Tobacco use disorder-unstable Provided counseling.  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  She will benefit from more regular psychotherapy sessions as well as alternative treatment if possible like TMS.  This was discussed with patient however she has been noncompliant.  Provided supportive psychotherapy.  Discussed with patient to start exercising, taking walks, reaching out to her support system.  Follow-up in clinic in 3 weeks or sooner if needed.  I have spent atleast 20 minutes non face to face with patient today. More than 50 % of the time was spent for preparing to see the patient ( e.g., review of test, records ),  ordering medications and test ,psychoeducation and supportive psychotherapy and care coordination,as well as documenting clinical information in electronic health record. This note was generated in part or whole with voice recognition software. Voice  recognition is usually quite accurate but there are transcription errors that can and very often do occur. I apologize for any typographical errors that were not detected and corrected.     Jomarie Longs, MD 01/18/2020, 5:04 PM

## 2020-01-24 ENCOUNTER — Telehealth: Payer: Self-pay

## 2020-01-24 NOTE — Telephone Encounter (Signed)
received notice that prior auth was needed for rexulti. pa was submitted- pending 

## 2020-01-25 NOTE — Telephone Encounter (Signed)
This letter is to notify you that your prior authorization request for REXULTI 1 MG has been  approved based upon the information we received from your healthcare practitioner.  Benefits for all services are subject to terms, conditions and eligibility as outlined in the  benefit documentation in effect at the time services are provided. The authorization is effective for a maximum of 1 fill(s) from 01/25/2020 to 02/23/2020, as  long as you are enrolled as a member of your current health plan. The request was reviewed and approved by a licensed clinical pharmacist. This request is approved for 45 tablets for 22 days. Another prior authorization [id number] entered for Rexulti 2 mg tablets, this request is effective  from 01/02/2020 through 12/31/2020 for 12 fills and is limited to 1 tablet per day.

## 2020-02-08 NOTE — Telephone Encounter (Signed)
receved a approval notice for the rexulti 1mg  has been approved. the authorization is effective for a maximum of 1 fills from  01-25-20 to 02-23-20

## 2020-02-11 ENCOUNTER — Encounter: Payer: Self-pay | Admitting: Psychiatry

## 2020-02-11 ENCOUNTER — Other Ambulatory Visit: Payer: Self-pay

## 2020-02-11 ENCOUNTER — Telehealth (INDEPENDENT_AMBULATORY_CARE_PROVIDER_SITE_OTHER): Payer: No Typology Code available for payment source | Admitting: Psychiatry

## 2020-02-11 DIAGNOSIS — F411 Generalized anxiety disorder: Secondary | ICD-10-CM | POA: Diagnosis not present

## 2020-02-11 DIAGNOSIS — F410 Panic disorder [episodic paroxysmal anxiety] without agoraphobia: Secondary | ICD-10-CM

## 2020-02-11 DIAGNOSIS — F341 Dysthymic disorder: Secondary | ICD-10-CM | POA: Diagnosis not present

## 2020-02-11 DIAGNOSIS — F900 Attention-deficit hyperactivity disorder, predominantly inattentive type: Secondary | ICD-10-CM

## 2020-02-11 MED ORDER — AMPHETAMINE-DEXTROAMPHETAMINE 20 MG PO TABS: 20.0000 mg | ORAL_TABLET | Freq: Two times a day (BID) | ORAL | 0 refills | Status: DC

## 2020-02-11 MED ORDER — BREXPIPRAZOLE 2 MG PO TABS: 2.0000 mg | ORAL_TABLET | Freq: Every day | ORAL | 1 refills | Status: DC

## 2020-02-11 MED ORDER — CLONAZEPAM 0.5 MG PO TABS: 0.2500 mg | ORAL_TABLET | Freq: Every evening | ORAL | 2 refills | Status: DC | PRN

## 2020-02-11 NOTE — Progress Notes (Signed)
Provider Location : [facility] Patient Location : Work  Virtual Visit via Video Note  I connected with Dana Benson on 02/11/20 at  9:20 AM EDT by a video enabled telemedicine application and verified that I am speaking with the correct person using two identifiers.   I discussed the limitations of evaluation and management by telemedicine and the availability of in person appointments. The patient expressed understanding and agreed to proceed.     I discussed the assessment and treatment plan with the patient. The patient was provided an opportunity to ask questions and all were answered. The patient agreed with the plan and demonstrated an understanding of the instructions.   The patient was advised to call back or seek an in-person evaluation if the symptoms worsen or if the condition fails to improve as anticipated.  BH MD OP Progress Note  02/11/2020 9:59 AM Dana Benson  MRN:  [id number]  Chief Complaint:  Chief Complaint    Follow-up     HPI: Dana Benson is a 59 year old Caucasian female who is divorced, employed, lives in [place], has a history of MDD, ADHD, panic attack was evaluated by telemedicine today.  Patient opted to do a video call today however did not want to show her face in the camera except for a brief time.  She reported that she does not like to show her face, she has been this way all her life .  Writer hence allowed patient to do the video call without showing her face on the camera after our initial face-to-face time for a minute or 2 since patient needed to be comfortable in session.  Patient today reports her depressive symptoms as improving on the current medication.  She however reports she did not increase the Rexulti to 2 mg as discussed.  She stayed only on the 1 mg.  She however is interested in increasing the dosage today.  She reports that she has felt more energy as well as has been staying more active during the day.  She has been doing  part-time Real estate work and she reports she has been busy doing that since she felt like she could do more.  She reports since she stays busy she does not get a lot of time to ruminate about things in general.  That also helps.  Patient continues to struggle with sleep issues more so because of her hot flashes/menopausal syndrome.  Patient reports there are days she wake up groggy.  She tried going down on the trazodone to 125 mg however that does not help all the time with her grogginess in the morning.  Patient reports work is going well.  She continues to be compliant on the Adderall and reports it helps with her focus.  She denies side effects to medications except for her grogginess from the trazodone.  Patient denies any suicidality, homicidality or perceptual disturbances.  Patient denies any other concerns today.  Visit Diagnosis:    ICD-10-CM   1. Persistent depressive disorder with anxious distress, currently moderate  F34.1 brexpiprazole (REXULTI) 2 MG TABS tablet  2. GAD (generalized anxiety disorder)  F41.1   3. Attention deficit hyperactivity disorder (ADHD), predominantly inattentive type  F90.0 amphetamine-dextroamphetamine (ADDERALL) 20 MG tablet  4. Panic attack  F41.0 clonazePAM (KLONOPIN) 0.5 MG tablet    Past Psychiatric History: I have reviewed past psychiatric history from my progress note on 01/08/2018.  Past trials of Effexor, Paxil, Wellbutrin, Prozac, Lexapro, Cymbalta, Celexa.  Past Medical History:  Past Medical History:  Diagnosis Date  . ADD (attention deficit disorder with hyperactivity)   . Allergic rhinitis   . Anxiety   . Depression   . GERD (gastroesophageal reflux disease)   . History of chicken pox     Past Surgical History:  Procedure Laterality Date  . APPENDECTOMY  1979  . [place]   during hysterectomy  . CHOLECYSTECTOMY  1985  . TONSILLECTOMY  1970  . VAGINAL HYSTERECTOMY  1995    Family Psychiatric History: I  have reviewed family psychiatric history from my progress note on 01/08/2018.  Family History:  Family History  Problem Relation Age of Onset  . Depression Mother   . Hypertension Mother   . Diabetes Mother   . Depression Sister   . Anxiety disorder Sister   . ADD / ADHD Sister   . Depression Son   . Depression Maternal Aunt   . Breast cancer Maternal Aunt 41  . Breast cancer Maternal Grandmother 60  . Breast cancer Cousin 52  . Breast cancer Maternal Aunt 56    Social History: I have reviewed social history from my progress note on 01/08/2018. Social History   Socioeconomic History  . Marital status: Single    Spouse name: Not on file  . Number of children: 3  . Years of education: Not on file  . Highest education level: Not on file  Occupational History    Employer: [employer]  Tobacco Use  . Smoking status: Current Every Day Smoker    Packs/day: 0.50    Years: 20.00    Pack years: 10.00    Types: Cigarettes  . Smokeless tobacco: Never Used  . Tobacco comment: 2 packs a week  Substance and Sexual Activity  . Alcohol use: Yes    Alcohol/week: 1.0 - 2.0 standard drinks    Types: 1 - 2 Glasses of wine per week    Comment: Wine HS occasional  . Drug use: No  . Sexual activity: Never  Other Topics Concern  . Not on file  Social History Narrative   Lives in [place]. Works at [employer].   Social Determinants of Health   Financial Resource Strain:   . Difficulty of Paying Living Expenses:   Food Insecurity:   . Worried About Charity fundraiser in the Last Year:   . Arboriculturist in the Last Year:   Transportation Needs:   . Film/video editor (Medical):   Marland Kitchen Lack of Transportation (Non-Medical):   Physical Activity:   . Days of Exercise per Week:   . Minutes of Exercise per Session:   Stress:   . Feeling of Stress :   Social Connections:   . Frequency of Communication with Friends and Family:   . Frequency of Social Gatherings with Friends and Family:   .  Attends Religious Services:   . Active Member of Clubs or Organizations:   . Attends Archivist Meetings:   Marland Kitchen Marital Status:     Allergies:  Allergies  Allergen Reactions  . Prozac [Fluoxetine Hcl] Other (See Comments)    GI ISSUES  . Trazodone And Nefazodone     nightmares  . Wellbutrin [Bupropion] Other (See Comments)    Dizziness and ? Hearing loss  . Prednisone Anxiety    Metabolic Disorder Labs: Lab Results  Component Value Date   HGBA1C 6.2 (H) 09/02/2019   MPG 131.24 09/02/2019   MPG 119.76 03/02/2018   No results found for:  PROLACTIN Lab Results  Component Value Date   CHOL 242 (H) 09/02/2019   TRIG 92 09/02/2019   HDL 67 09/02/2019   CHOLHDL 3.6 09/02/2019   VLDL 18 09/02/2019   LDLCALC 157 (H) 09/02/2019   LDLCALC 152 (H) 03/02/2018   Lab Results  Component Value Date   TSH 1.406 09/02/2019   TSH 1.159 03/02/2018    Therapeutic Level Labs: No results found for: LITHIUM No results found for: VALPROATE No components found for:  CBMZ  Current Medications: Current Outpatient Medications  Medication Sig Dispense Refill  . amphetamine-dextroamphetamine (ADDERALL) 20 MG tablet Take 1 tablet (20 mg total) by mouth 2 (two) times daily. 60 tablet 0  . amphetamine-dextroamphetamine (ADDERALL) 20 MG tablet Take 1 tablet (20 mg total) by mouth 2 (two) times daily. 60 tablet 0  . brexpiprazole (REXULTI) 2 MG TABS tablet Take 1 tablet (2 mg total) by mouth daily. 30 tablet 1  . busPIRone (BUSPAR) 10 MG tablet Take 2 tablets (20 mg total) by mouth 2 (two) times daily. 360 tablet 0  . cetirizine (ZYRTEC) 10 MG tablet Take 10 mg by mouth daily.    . clonazePAM (KLONOPIN) 0.5 MG tablet Take 0.5-1 tablets (0.25-0.5 mg total) by mouth at bedtime as needed for anxiety. 26 tablet 2  . cyanocobalamin (,VITAMIN B-12,) 1000 MCG/ML injection Inject 1 mL (1,000 mcg total) into the muscle every 30 (thirty) days. 1 mL 11  . cyanocobalamin (,VITAMIN B-12,) 1000 MCG/ML  injection Inject 1 mL (1,000 mcg total) into the muscle once. 1 mL 0  . fluticasone (FLONASE) 50 MCG/ACT nasal spray Place 2 sprays into both nostrils daily. 16 g 11  . Linaclotide (LINZESS) 290 MCG CAPS capsule Take 1 capsule (290 mcg total) by mouth daily. 30 capsule 6  . ondansetron (ZOFRAN) 8 MG tablet Take 1 tablet (8 mg total) by mouth every 8 (eight) hours as needed for nausea or vomiting. 30 tablet 3  . pantoprazole (PROTONIX) 40 MG tablet   5  . traZODone (DESYREL) 100 MG tablet Take 1 tablet (100 mg total) by mouth at bedtime. 90 tablet 2  . traZODone (DESYREL) 50 MG tablet Take 1 tablet (50 mg total) by mouth at bedtime as needed for sleep. To be combined with 100 mg 90 tablet 2  . vortioxetine HBr (TRINTELLIX) 20 MG TABS tablet Take 1 tablet (20 mg total) by mouth daily. 90 tablet 0   Current Facility-Administered Medications  Medication Dose Route Frequency Provider Last Rate Last Admin  . cyanocobalamin ((VITAMIN B-12)) injection 1,000 mcg  1,000 mcg Intramuscular Once Sherrie Mustache Roselyn Bering, PA-C         Musculoskeletal: Strength & Muscle Tone: UTA Gait & Station: normal Patient leans: N/A  Psychiatric Specialty Exam: Review of Systems  Psychiatric/Behavioral: Positive for dysphoric mood and sleep disturbance (Restless at times due to hotflashes). Negative for agitation, behavioral problems, confusion, decreased concentration, hallucinations, self-injury and suicidal ideas. The patient is not nervous/anxious and is not hyperactive.   All other systems reviewed and are negative.   There were no vitals taken for this visit.There is no height or weight on file to calculate BMI.  General Appearance: Casual  Eye Contact:  Fair  Speech:  Clear and Coherent  Volume:  Normal  Mood:  Dysphoric improving  Affect:  Congruent  Thought Process:  Goal Directed and Descriptions of Associations: Intact  Orientation:  Full (Time, Place, and Person)  Thought Content: Logical   Suicidal  Thoughts:  No  Homicidal  Thoughts:  No  Memory:  Immediate;   Fair Recent;   Fair Remote;   Fair  Judgement:  Fair  Insight:  Fair  Psychomotor Activity:  Normal  Concentration:  Concentration: Fair and Attention Span: Fair  Recall:  [employer] of Knowledge: Fair  Language: Fair  Akathisia:  No  Handed:  Right  AIMS (if indicated):UTA  Assets:  Communication Skills Desire for Improvement Housing Social Support  ADL's:  Intact  Cognition: WNL  Sleep:  restless due to hotflashes   Screenings: PHQ2-9     Office Visit from 03/13/2016 in [facility] Office Visit from 08/20/2012 in [facility]  PHQ-2 Total Score  0  0       Assessment and Plan: ZERA MARKWARDT is a 59 year old Caucasian female who has a history of depression, ADHD was evaluated by telemedicine today.  Patient is currently making progress since being on the Rexulti.  She continues to struggle with sleep issues as noted above.  Patient with psychosocial stressors of the pandemic, being divorced, death of her son, job related stressors.  Patient will continue to benefit from medication readjustment.  Discussed with patient about CBT however she reports she does not think it benefits her and does not want to do it at this time.  Plan Depression-improving Trintellix 20 mg p.o. daily BuSpar 20 mg p.o. twice daily Increase Rexulti to 2 mg p.o. nightly  Sleep problems-improving Patient does report grogginess during the day, especially when she wakes up and hence reduced the trazodone to 125 mg p.o. nightly as needed last visit.  She however continues to have some residual grogginess in the morning.  She also has hot flashes at night which makes her sleep to be restless. Discussed with patient to talk to her OB/GYN/PMD about her hot flashes. Also discussed reducing the trazodone further to 100 mg at bedtime if she continues to have grogginess.  Panic attacks-stable BuSpar as  prescribed Klonopin 0.25 mg as needed for severe anxiety attacks I have reviewed [place] controlled substance database.  ADHD-stable Adderall 20 mg p.o. twice daily I have reviewed [place] controlled substance database.  Tobacco use disorder-unstable Provided counseling  Follow-up in clinic in 4 weeks or sooner if needed.  I have spent atleast 20 minutes non face to face with patient today. More than 50 % of the time was spent for preparing to see the patient ( e.g., review of test, records ), obtaining and to review and separately obtained history , ordering medications and test ,psychoeducation and supportive psychotherapy and care coordination,as well as documenting clinical information in electronic health record. This note was generated in part or whole with voice recognition software. Voice recognition is usually quite accurate but there are transcription errors that can and very often do occur. I apologize for any typographical errors that were not detected and corrected.        Jomarie Longs, MD 02/11/2020, 9:59 AM

## 2020-02-25 ENCOUNTER — Telehealth: Payer: Self-pay

## 2020-02-25 NOTE — Telephone Encounter (Signed)
received notice that rexulti 2mg  pa is not required already on file effective 02-01-20 to  01-30-21

## 2020-03-17 ENCOUNTER — Encounter: Payer: Self-pay | Admitting: Psychiatry

## 2020-03-17 ENCOUNTER — Other Ambulatory Visit: Payer: Self-pay

## 2020-03-17 ENCOUNTER — Telehealth (INDEPENDENT_AMBULATORY_CARE_PROVIDER_SITE_OTHER): Payer: No Typology Code available for payment source | Admitting: Psychiatry

## 2020-03-17 DIAGNOSIS — F410 Panic disorder [episodic paroxysmal anxiety] without agoraphobia: Secondary | ICD-10-CM

## 2020-03-17 DIAGNOSIS — F411 Generalized anxiety disorder: Secondary | ICD-10-CM | POA: Diagnosis not present

## 2020-03-17 DIAGNOSIS — F341 Dysthymic disorder: Secondary | ICD-10-CM

## 2020-03-17 DIAGNOSIS — F900 Attention-deficit hyperactivity disorder, predominantly inattentive type: Secondary | ICD-10-CM | POA: Diagnosis not present

## 2020-03-17 MED ORDER — AMPHETAMINE-DEXTROAMPHETAMINE 20 MG PO TABS: 20.0000 mg | ORAL_TABLET | Freq: Two times a day (BID) | ORAL | 0 refills | Status: DC

## 2020-03-17 MED ORDER — REXULTI 3 MG PO TABS: 3.0000 mg | ORAL_TABLET | Freq: Every day | ORAL | 1 refills | Status: DC

## 2020-03-17 NOTE — Progress Notes (Signed)
Provider Location : [facility] Patient Location : Work  Virtual Visit via Video Note  I connected with Dana Benson on 03/17/20 at  9:30 AM EDT by a video enabled telemedicine application and verified that I am speaking with the correct person using two identifiers.   I discussed the limitations of evaluation and management by telemedicine and the availability of in person appointments. The patient expressed understanding and agreed to proceed.    I discussed the assessment and treatment plan with the patient. The patient was provided an opportunity to ask questions and all were answered. The patient agreed with the plan and demonstrated an understanding of the instructions.   The patient was advised to call back or seek an in-person evaluation if the symptoms worsen or if the condition fails to improve as anticipated.   [place] MD OP Progress Note  03/17/2020 10:01 AM Dana Benson  MRN:  [id number]  Chief Complaint:  Chief Complaint    Follow-up     HPI: Dana Benson is a 59 year old Caucasian female who is divorced, employed, lives in [place], has a history of MDD, ADHD, panic attack was evaluated by telemedicine today.  Patient today reports she is currently making progress with regards to her mood symptoms.  She reports her sadness, lack of motivation has improved.  She reports that Rexulti definitely at this dosage is helpful.  She denies side effects.  She however would like to increase the dosage of Rexulti if possible.  She currently rates her depression at a 4 out of 10, 10 being the worst.  She reports she continues to struggle with her attention and focus at work.  She does not know if it is because she is bored or not.  She will continue to monitor her symptoms and let writer know.  She reports sleep is improving.  She tried backing off on the trazodone to a 100 mg and she started waking up at around 3 AM every day.  She hence has started taking 125 mg which has been  helpful.  She reports she currently keeps herself busy doing activities outside of her work and that has been very helpful.  She reports she is not interested in restarting psychotherapy sessions again and feels she is okay without it.  She denies any suicidality, homicidality or perceptual disturbances.  She denies any other concerns today.  Visit Diagnosis:    ICD-10-CM   1. Persistent depressive disorder with anxious distress, currently moderate  F34.1 Brexpiprazole (REXULTI) 3 MG TABS   in partial remission  2. GAD (generalized anxiety disorder)  F41.1   3. Attention deficit hyperactivity disorder (ADHD), predominantly inattentive type  F90.0 amphetamine-dextroamphetamine (ADDERALL) 20 MG tablet    amphetamine-dextroamphetamine (ADDERALL) 20 MG tablet  4. Panic attack  F41.0     Past Psychiatric History: I have reviewed past psychiatric history from my progress note on 01/08/2018.  Past trials of Effexor, Paxil, Wellbutrin, Prozac, Lexapro, Cymbalta, Celexa  Past Medical History:  Past Medical History:  Diagnosis Date  . ADD (attention deficit disorder with hyperactivity)   . Allergic rhinitis   . Anxiety   . Depression   . GERD (gastroesophageal reflux disease)   . History of chicken pox     Past Surgical History:  Procedure Laterality Date  . APPENDECTOMY  1979  . [place]   during hysterectomy  . CHOLECYSTECTOMY  1985  . TONSILLECTOMY  1970  . [place]    Family  Psychiatric History: I have reviewed family psychiatric history from my progress note on 01/08/2018.  Family History:  Family History  Problem Relation Age of Onset  . Depression Mother   . Hypertension Mother   . Diabetes Mother   . Depression Sister   . Anxiety disorder Sister   . ADD / ADHD Sister   . Depression Son   . Depression Maternal Aunt   . Breast cancer Maternal Aunt 44  . Breast cancer Maternal Grandmother 60  . Breast cancer Cousin 44  . Breast  cancer Maternal Aunt 68    Social History: Reviewed social history from my progress note on 01/08/2018. Social History   Socioeconomic History  . Marital status: Single    Spouse name: Not on file  . Number of children: 3  . Years of education: Not on file  . Highest education level: Not on file  Occupational History    Employer: [employer]  Tobacco Use  . Smoking status: Current Every Day Smoker    Packs/day: 0.50    Years: 20.00    Pack years: 10.00    Types: Cigarettes  . Smokeless tobacco: Never Used  . Tobacco comment: 2 packs a week  Substance and Sexual Activity  . Alcohol use: Yes    Alcohol/week: 1.0 - 2.0 standard drink    Types: 1 - 2 Glasses of wine per week    Comment: Wine HS occasional  . Drug use: No  . Sexual activity: Never  Other Topics Concern  . Not on file  Social History Narrative   Lives in [place]. Works at [employer].   Social Determinants of Health   Financial Resource Strain:   . Difficulty of Paying Living Expenses:   Food Insecurity:   . Worried About Programme researcher, broadcasting/film/video in the Last Year:   . Barista in the Last Year:   Transportation Needs:   . Freight forwarder (Medical):   Marland Kitchen Lack of Transportation (Non-Medical):   Physical Activity:   . Days of Exercise per Week:   . Minutes of Exercise per Session:   Stress:   . Feeling of Stress :   Social Connections:   . Frequency of Communication with Friends and Family:   . Frequency of Social Gatherings with Friends and Family:   . Attends Religious Services:   . Active Member of Clubs or Organizations:   . Attends Banker Meetings:   Marland Kitchen Marital Status:     Allergies:  Allergies  Allergen Reactions  . Prozac [Fluoxetine Hcl] Other (See Comments)    GI ISSUES  . Trazodone And Nefazodone     nightmares  . Wellbutrin [Bupropion] Other (See Comments)    Dizziness and ? Hearing loss  . Prednisone Anxiety    Metabolic Disorder Labs: Lab Results  Component Value  Date   HGBA1C 6.2 (H) 09/02/2019   MPG 131.24 09/02/2019   MPG 119.76 03/02/2018   No results found for: PROLACTIN Lab Results  Component Value Date   CHOL 242 (H) 09/02/2019   TRIG 92 09/02/2019   HDL 67 09/02/2019   CHOLHDL 3.6 09/02/2019   VLDL 18 09/02/2019   LDLCALC 157 (H) 09/02/2019   LDLCALC 152 (H) 03/02/2018   Lab Results  Component Value Date   TSH 1.406 09/02/2019   TSH 1.159 03/02/2018    Therapeutic Level Labs: No results found for: LITHIUM No results found for: VALPROATE No components found for:  CBMZ  Current Medications: Current  Outpatient Medications  Medication Sig Dispense Refill  . amphetamine-dextroamphetamine (ADDERALL) 20 MG tablet Take 1 tablet (20 mg total) by mouth 2 (two) times daily. 60 tablet 0  . [START ON 04/13/2020] amphetamine-dextroamphetamine (ADDERALL) 20 MG tablet Take 1 tablet (20 mg total) by mouth 2 (two) times daily. 60 tablet 0  . Brexpiprazole (REXULTI) 3 MG TABS Take 1 tablet (3 mg total) by mouth daily. 30 tablet 1  . busPIRone (BUSPAR) 10 MG tablet Take 2 tablets (20 mg total) by mouth 2 (two) times daily. 360 tablet 0  . cetirizine (ZYRTEC) 10 MG tablet Take 10 mg by mouth daily.    . clonazePAM (KLONOPIN) 0.5 MG tablet Take 0.5-1 tablets (0.25-0.5 mg total) by mouth at bedtime as needed for anxiety. 26 tablet 2  . cyanocobalamin (,VITAMIN B-12,) 1000 MCG/ML injection Inject 1 mL (1,000 mcg total) into the muscle every 30 (thirty) days. 1 mL 11  . cyanocobalamin (,VITAMIN B-12,) 1000 MCG/ML injection Inject 1 mL (1,000 mcg total) into the muscle once. 1 mL 0  . fluticasone (FLONASE) 50 MCG/ACT nasal spray Place 2 sprays into both nostrils daily. 16 g 11  . Linaclotide (LINZESS) 290 MCG CAPS capsule Take 1 capsule (290 mcg total) by mouth daily. 30 capsule 6  . losartan (COZAAR) 50 MG tablet Take 50 mg by mouth daily.    . ondansetron (ZOFRAN) 8 MG tablet Take 1 tablet (8 mg total) by mouth every 8 (eight) hours as needed for  nausea or vomiting. 30 tablet 3  . pantoprazole (PROTONIX) 40 MG tablet   5  . traZODone (DESYREL) 100 MG tablet Take 1 tablet (100 mg total) by mouth at bedtime. 90 tablet 2  . traZODone (DESYREL) 50 MG tablet Take 1 tablet (50 mg total) by mouth at bedtime as needed for sleep. To be combined with 100 mg 90 tablet 2  . vortioxetine HBr (TRINTELLIX) 20 MG TABS tablet Take 1 tablet (20 mg total) by mouth daily. 90 tablet 0   Current Facility-Administered Medications  Medication Dose Route Frequency Provider Last Rate Last Admin  . cyanocobalamin ((VITAMIN B-12)) injection 1,000 mcg  1,000 mcg Intramuscular Once Sherrie Mustache Roselyn Bering, PA-C         Musculoskeletal: Strength & Muscle Tone: UTA Gait & Station: normal Patient leans: N/A  Psychiatric Specialty Exam: Review of Systems  Psychiatric/Behavioral: Negative for agitation, behavioral problems, confusion, decreased concentration, dysphoric mood, hallucinations, self-injury, sleep disturbance and suicidal ideas. The patient is not nervous/anxious and is not hyperactive.   All other systems reviewed and are negative.   There were no vitals taken for this visit.There is no height or weight on file to calculate BMI.  General Appearance: Casual  Eye Contact:  Fair  Speech:  Normal Rate  Volume:  Normal  Mood:  Euthymic  Affect:  Congruent  Thought Process:  Goal Directed and Descriptions of Associations: Intact  Orientation:  Full (Time, Place, and Person)  Thought Content: Logical   Suicidal Thoughts:  No  Homicidal Thoughts:  No  Memory:  Immediate;   Fair Recent;   Fair Remote;   Fair  Judgement:  Fair  Insight:  Fair  Psychomotor Activity:  Normal  Concentration:  Concentration: Fair and Attention Span: Fair  Recall:  [employer] of Knowledge: Fair  Language: Fair  Akathisia:  No  Handed:  Right  AIMS (if indicated): UTA  Assets:  Communication Skills Desire for Improvement Housing Social Support  ADL's:  Intact   Cognition: WNL  Sleep:  Fair   Screenings: PHQ2-9     [facility] Visit from 03/13/[facility] in [facility] Office Visit from 08/20/2012 in [facility]  PHQ-2 Total Score 0 0       Assessment and Plan: Dana Benson is a 59 year old Caucasian female who has a history of depression, ADHD was evaluated by telemedicine today.  Patient is currently improving with regards to her depressive symptoms however will continue to benefit from medication readjustment for residual symptoms.  She will benefit from psychotherapy sessions however she declines.  Plan as noted below.  Plan Depression-in partial remission Increase Rexulti to 3 mg p.o. nightly. Trintellix 20 mg p.o. daily BuSpar 20 mg p.o. twice daily  Sleep problems-improving Patient is currently taking trazodone 125 mg p.o. nightly.  She also has hot flashes at night which does affect her sleep.  Will monitor closely.  Panic attacks-stable BuSpar 20 mg twice a day. Klonopin 0.25 mg as needed for severe anxiety attacks only. I have reviewed [place] controlled substance database.  ADHD-stable Adderall 20 mg p.o. twice daily-provided 2 prescriptions with date specified last 1 to be filled on or after April 13, 2020. I have reviewed [place] controlled substance database.  Tobacco use disorder-improving Provided counseling.  Follow-up in clinic in 6 weeks or sooner if needed.  I have spent atleast 20 minutes non- face to face with patient today. More than 50 % of the time was spent for preparing to see the patient ( e.g., review of test, records ), obtaining and to review and separately obtained history , ordering medications and test ,psychoeducation and supportive psychotherapy and care coordination,as well as documenting clinical information in electronic health record. This note was generated in part or whole with voice recognition software. Voice recognition is usually quite accurate but there are  transcription errors that can and very often do occur. I apologize for any typographical errors that were not detected and corrected.          Jomarie Longs, MD 03/17/2020, 10:01 AM

## 2020-04-17 ENCOUNTER — Other Ambulatory Visit: Payer: Self-pay | Admitting: Physician Assistant

## 2020-05-11 ENCOUNTER — Other Ambulatory Visit: Payer: Self-pay | Admitting: Psychiatry

## 2020-05-11 DIAGNOSIS — F900 Attention-deficit hyperactivity disorder, predominantly inattentive type: Secondary | ICD-10-CM

## 2020-05-11 DIAGNOSIS — F411 Generalized anxiety disorder: Secondary | ICD-10-CM

## 2020-05-11 DIAGNOSIS — F410 Panic disorder [episodic paroxysmal anxiety] without agoraphobia: Secondary | ICD-10-CM

## 2020-05-11 DIAGNOSIS — F341 Dysthymic disorder: Secondary | ICD-10-CM

## 2020-05-19 ENCOUNTER — Telehealth (INDEPENDENT_AMBULATORY_CARE_PROVIDER_SITE_OTHER): Payer: No Typology Code available for payment source | Admitting: Psychiatry

## 2020-05-19 ENCOUNTER — Other Ambulatory Visit: Payer: Self-pay

## 2020-05-19 DIAGNOSIS — F341 Dysthymic disorder: Secondary | ICD-10-CM

## 2020-05-19 NOTE — Progress Notes (Signed)
[place] of state , hence cannot complete telemedicine appt today. Patient states she was not aware of this. Will pass the message to staff to cancel and reschedule.

## 2020-05-30 ENCOUNTER — Other Ambulatory Visit: Payer: Self-pay | Admitting: Physician Assistant

## 2020-05-31 ENCOUNTER — Other Ambulatory Visit
Admission: RE | Admit: 2020-05-31 | Discharge: 2020-05-31 | Disposition: A | Discharge status: Home / Self Care | Payer: No Typology Code available for payment source | Attending: Physician Assistant | Admitting: Physician Assistant

## 2020-05-31 DIAGNOSIS — R7303 Prediabetes: Secondary | ICD-10-CM | POA: Diagnosis not present

## 2020-05-31 DIAGNOSIS — E782 Mixed hyperlipidemia: Secondary | ICD-10-CM | POA: Insufficient documentation

## 2020-05-31 DIAGNOSIS — I10 Essential (primary) hypertension: Secondary | ICD-10-CM | POA: Diagnosis not present

## 2020-05-31 LAB — COMPREHENSIVE METABOLIC PANEL
ALT: 50 U/L — ABNORMAL HIGH (ref 0–44)
AST: 38 U/L (ref 15–41)
Albumin: 4.8 g/dL (ref 3.5–5.0)
Alkaline Phosphatase: 86 U/L (ref 38–126)
Anion gap: 10 (ref 5–15)
BUN: 14 mg/dL (ref 6–20)
CO2: 25 mmol/L (ref 22–32)
Calcium: 9.6 mg/dL (ref 8.9–10.3)
Chloride: 104 mmol/L (ref 98–111)
Creatinine, Ser: 1.05 mg/dL — ABNORMAL HIGH (ref 0.44–1.00)
GFR calc Af Amer: >60 mL/min (ref >60)
GFR calc non Af Amer: 58 mL/min — ABNORMAL LOW (ref >60)
Glucose, Bld: 164 mg/dL — ABNORMAL HIGH (ref 70–99)
Potassium: 3.6 mmol/L (ref 3.5–5.1)
Sodium: 139 mmol/L (ref 135–145)
Total Bilirubin: 1.2 mg/dL (ref 0.3–1.2)
Total Protein: 7.7 g/dL (ref 6.5–8.1)

## 2020-05-31 LAB — LIPID PANEL
Cholesterol: 294 mg/dL — ABNORMAL HIGH (ref 0–200)
HDL: 66 mg/dL (ref >40)
LDL Cholesterol: 184 mg/dL — ABNORMAL HIGH (ref 0–99)
Total CHOL/HDL Ratio: 4.5 RATIO
Triglycerides: 222 mg/dL — ABNORMAL HIGH (ref <150)
VLDL: 44 mg/dL — ABNORMAL HIGH (ref 0–40)

## 2020-05-31 LAB — URINALYSIS, COMPLETE (UACMP) WITH MICROSCOPIC
Bacteria, UA: NONE SEEN
Bilirubin Urine: NEGATIVE
Glucose, UA: NEGATIVE mg/dL
Hgb urine dipstick: NEGATIVE
Ketones, ur: NEGATIVE mg/dL
Leukocytes,Ua: NEGATIVE
Nitrite: NEGATIVE
Protein, ur: NEGATIVE mg/dL
Specific Gravity, Urine: 1.012 (ref 1.005–1.030)
pH: 5 (ref 5.0–8.0)

## 2020-05-31 LAB — HEMOGLOBIN A1C
Hgb A1c MFr Bld: 6.2 % — ABNORMAL HIGH (ref 4.8–5.6)
Mean Plasma Glucose: 131.24 mg/dL

## 2020-06-20 ENCOUNTER — Other Ambulatory Visit
Admission: RE | Admit: 2020-06-20 | Discharge: 2020-06-20 | Disposition: A | Discharge status: Home / Self Care | Payer: No Typology Code available for payment source | Attending: Psychiatry | Admitting: Psychiatry

## 2020-06-20 DIAGNOSIS — Z79899 Other long term (current) drug therapy: Secondary | ICD-10-CM | POA: Insufficient documentation

## 2020-06-20 DIAGNOSIS — F411 Generalized anxiety disorder: Secondary | ICD-10-CM | POA: Insufficient documentation

## 2020-06-20 DIAGNOSIS — F331 Major depressive disorder, recurrent, moderate: Secondary | ICD-10-CM | POA: Diagnosis not present

## 2020-06-20 LAB — TSH: TSH: 1.595 u[IU]/mL (ref 0.350–4.500)

## 2020-06-20 LAB — LIPID PANEL
Cholesterol: 221 mg/dL — ABNORMAL HIGH (ref 0–200)
HDL: 44 mg/dL (ref >40)
LDL Cholesterol: 158 mg/dL — ABNORMAL HIGH (ref 0–99)
Total CHOL/HDL Ratio: 5 RATIO
Triglycerides: 93 mg/dL (ref <150)
VLDL: 19 mg/dL (ref 0–40)

## 2020-06-20 LAB — CBC WITH DIFFERENTIAL/PLATELET
Abs Immature Granulocytes: 0.03 10*3/uL (ref 0.00–0.07)
Basophils Absolute: 0.1 10*3/uL (ref 0.0–0.1)
Basophils Relative: 1 %
Eosinophils Absolute: 0.2 10*3/uL (ref 0.0–0.5)
Eosinophils Relative: 2 %
HCT: 30.8 % — ABNORMAL LOW (ref 36.0–46.0)
Hemoglobin: 11.2 g/dL — ABNORMAL LOW (ref 12.0–15.0)
Immature Granulocytes: 1 %
Lymphocytes Relative: 28 %
Lymphs Abs: 1.9 10*3/uL (ref 0.7–4.0)
MCH: 32 pg (ref 26.0–34.0)
MCHC: 36.4 g/dL — ABNORMAL HIGH (ref 30.0–36.0)
MCV: 88 fL (ref 80.0–100.0)
Monocytes Absolute: 0.4 10*3/uL (ref 0.1–1.0)
Monocytes Relative: 6 %
Neutro Abs: 4 10*3/uL (ref 1.7–7.7)
Neutrophils Relative %: 62 %
Platelets: 320 10*3/uL (ref 150–400)
RBC: 3.5 MIL/uL — ABNORMAL LOW (ref 3.87–5.11)
RDW: 11.6 % (ref 11.5–15.5)
WBC: 6.5 10*3/uL (ref 4.0–10.5)
nRBC: 0 % (ref 0.0–0.2)

## 2020-06-20 LAB — COMPREHENSIVE METABOLIC PANEL
ALT: 40 U/L (ref 0–44)
AST: 29 U/L (ref 15–41)
Albumin: 4.3 g/dL (ref 3.5–5.0)
Alkaline Phosphatase: 86 U/L (ref 38–126)
Anion gap: 12 (ref 5–15)
BUN: 12 mg/dL (ref 6–20)
CO2: 25 mmol/L (ref 22–32)
Calcium: 9.3 mg/dL (ref 8.9–10.3)
Chloride: 89 mmol/L — ABNORMAL LOW (ref 98–111)
Creatinine, Ser: 1.04 mg/dL — ABNORMAL HIGH (ref 0.44–1.00)
GFR calc Af Amer: >60 mL/min (ref >60)
GFR calc non Af Amer: 59 mL/min — ABNORMAL LOW (ref >60)
Glucose, Bld: 123 mg/dL — ABNORMAL HIGH (ref 70–99)
Potassium: 2.9 mmol/L — ABNORMAL LOW (ref 3.5–5.1)
Sodium: 126 mmol/L — ABNORMAL LOW (ref 135–145)
Total Bilirubin: 0.9 mg/dL (ref 0.3–1.2)
Total Protein: 7.1 g/dL (ref 6.5–8.1)

## 2020-06-20 LAB — HEMOGLOBIN A1C
Hgb A1c MFr Bld: 6.1 % — ABNORMAL HIGH (ref 4.8–5.6)
Mean Plasma Glucose: 128.37 mg/dL

## 2020-06-20 LAB — FOLATE: Folate: 7.9 ng/mL (ref >5.9)

## 2020-06-20 LAB — VITAMIN B12: Vitamin B-12: 372 pg/mL (ref 180–914)

## 2020-07-03 ENCOUNTER — Telehealth: Payer: No Typology Code available for payment source | Admitting: Psychiatry

## 2020-07-12 ENCOUNTER — Other Ambulatory Visit
Admission: RE | Admit: 2020-07-12 | Discharge: 2020-07-12 | Disposition: A | Discharge status: Home / Self Care | Payer: No Typology Code available for payment source | Source: Ambulatory Visit | Attending: Physician Assistant | Admitting: Physician Assistant

## 2020-07-12 ENCOUNTER — Other Ambulatory Visit: Payer: Self-pay

## 2020-07-12 DIAGNOSIS — I10 Essential (primary) hypertension: Secondary | ICD-10-CM | POA: Insufficient documentation

## 2020-07-12 LAB — CBC WITH DIFFERENTIAL/PLATELET
Abs Immature Granulocytes: 0.01 10*3/uL (ref 0.00–0.07)
Basophils Absolute: 0.1 10*3/uL (ref 0.0–0.1)
Basophils Relative: 1 %
Eosinophils Absolute: 0.2 10*3/uL (ref 0.0–0.5)
Eosinophils Relative: 3 %
HCT: 36.1 % (ref 36.0–46.0)
Hemoglobin: 13.1 g/dL (ref 12.0–15.0)
Immature Granulocytes: 0 %
Lymphocytes Relative: 32 %
Lymphs Abs: 2 10*3/uL (ref 0.7–4.0)
MCH: 32.3 pg (ref 26.0–34.0)
MCHC: 36.3 g/dL — ABNORMAL HIGH (ref 30.0–36.0)
MCV: 88.9 fL (ref 80.0–100.0)
Monocytes Absolute: 0.4 10*3/uL (ref 0.1–1.0)
Monocytes Relative: 7 %
Neutro Abs: 3.4 10*3/uL (ref 1.7–7.7)
Neutrophils Relative %: 57 %
Platelets: 364 10*3/uL (ref 150–400)
RBC: 4.06 MIL/uL (ref 3.87–5.11)
RDW: 11.8 % (ref 11.5–15.5)
WBC: 6 10*3/uL (ref 4.0–10.5)
nRBC: 0 % (ref 0.0–0.2)

## 2020-07-12 LAB — COMPREHENSIVE METABOLIC PANEL
ALT: 28 U/L (ref 0–44)
AST: 25 U/L (ref 15–41)
Albumin: 4.4 g/dL (ref 3.5–5.0)
Alkaline Phosphatase: 86 U/L (ref 38–126)
Anion gap: 10 (ref 5–15)
BUN: 13 mg/dL (ref 6–20)
CO2: 26 mmol/L (ref 22–32)
Calcium: 9.4 mg/dL (ref 8.9–10.3)
Chloride: 91 mmol/L — ABNORMAL LOW (ref 98–111)
Creatinine, Ser: 1.01 mg/dL — ABNORMAL HIGH (ref 0.44–1.00)
GFR, Estimated: >60 mL/min (ref >60)
Glucose, Bld: 109 mg/dL — ABNORMAL HIGH (ref 70–99)
Potassium: 3.1 mmol/L — ABNORMAL LOW (ref 3.5–5.1)
Sodium: 127 mmol/L — ABNORMAL LOW (ref 135–145)
Total Bilirubin: 0.8 mg/dL (ref 0.3–1.2)
Total Protein: 7.1 g/dL (ref 6.5–8.1)

## 2020-07-12 LAB — TSH: TSH: 1.374 u[IU]/mL (ref 0.350–4.500)

## 2020-07-12 LAB — MAGNESIUM: Magnesium: 2.1 mg/dL (ref 1.7–2.4)

## 2020-07-28 ENCOUNTER — Other Ambulatory Visit: Payer: Self-pay | Admitting: Psychiatry

## 2020-08-18 ENCOUNTER — Other Ambulatory Visit: Payer: Self-pay | Admitting: Psychiatry

## 2020-08-22 ENCOUNTER — Other Ambulatory Visit: Payer: Self-pay | Admitting: Psychiatry

## 2020-08-30 ENCOUNTER — Other Ambulatory Visit: Payer: Self-pay | Admitting: Physician Assistant

## 2020-09-08 ENCOUNTER — Other Ambulatory Visit: Payer: Self-pay | Admitting: Psychiatry

## 2020-09-29 ENCOUNTER — Other Ambulatory Visit: Payer: Self-pay | Admitting: Psychiatry

## 2020-11-28 ENCOUNTER — Other Ambulatory Visit: Payer: Self-pay | Admitting: Physician Assistant

## 2020-11-29 ENCOUNTER — Other Ambulatory Visit: Payer: Self-pay | Admitting: Psychiatry

## 2020-12-06 ENCOUNTER — Other Ambulatory Visit: Payer: Self-pay | Admitting: Psychiatry

## 2021-01-03 ENCOUNTER — Other Ambulatory Visit: Payer: Self-pay

## 2021-01-03 ENCOUNTER — Other Ambulatory Visit: Payer: Self-pay | Admitting: Psychiatry

## 2021-01-05 ENCOUNTER — Other Ambulatory Visit: Payer: Self-pay

## 2021-01-11 ENCOUNTER — Other Ambulatory Visit: Payer: Self-pay

## 2021-01-11 MED ORDER — HYDROXYZINE PAMOATE 50 MG PO CAPS
ORAL_CAPSULE | ORAL | 0 refills | Status: DC
  Filled 2021-01-11: qty 90, 90d supply, fill #0

## 2021-01-12 ENCOUNTER — Other Ambulatory Visit: Payer: Self-pay

## 2021-01-16 ENCOUNTER — Other Ambulatory Visit: Payer: Self-pay

## 2021-01-25 ENCOUNTER — Other Ambulatory Visit: Payer: Self-pay

## 2021-01-25 MED ORDER — BUPROPION HCL ER (SR) 200 MG PO TB12
ORAL_TABLET | ORAL | 0 refills | Status: DC
Start: 2021-01-25 — End: 2021-08-06
  Filled 2021-01-25: qty 90, 90d supply, fill #0

## 2021-01-25 MED ORDER — ESZOPICLONE 3 MG PO TABS
ORAL_TABLET | ORAL | 0 refills | Status: DC
Start: 2021-01-25 — End: 2021-08-06
  Filled 2021-01-25: qty 90, 90d supply, fill #0

## 2021-01-25 MED ORDER — AMPHETAMINE-DEXTROAMPHETAMINE 20 MG PO TABS
ORAL_TABLET | ORAL | 0 refills | Status: DC
Start: 2021-01-25 — End: 2021-08-06
  Filled 2021-01-25 – 2021-03-01 (×2): qty 180, 90d supply, fill #0

## 2021-01-25 MED ORDER — SERTRALINE HCL 100 MG PO TABS
ORAL_TABLET | ORAL | 0 refills | Status: DC
Start: 2021-01-25 — End: 2021-08-06
  Filled 2021-01-25: qty 180, 90d supply, fill #0

## 2021-02-27 ENCOUNTER — Other Ambulatory Visit: Payer: Self-pay

## 2021-02-27 MED ORDER — HYDROXYZINE PAMOATE 50 MG PO CAPS
ORAL_CAPSULE | ORAL | 0 refills | Status: DC
Start: 2021-02-27 — End: 2021-08-06
  Filled 2021-02-27: qty 90, 90d supply, fill #0

## 2021-02-27 MED ORDER — SERTRALINE HCL 100 MG PO TABS
ORAL_TABLET | ORAL | 0 refills | Status: DC
Start: 2021-02-27 — End: 2021-08-06
  Filled 2021-02-27 – 2021-03-01 (×2): qty 180, 90d supply, fill #0

## 2021-02-27 MED ORDER — BUPROPION HCL ER (SR) 200 MG PO TB12
ORAL_TABLET | ORAL | 0 refills | Status: DC
Start: 2021-02-27 — End: 2021-08-06

## 2021-02-27 MED ORDER — BUPROPION HCL ER (SR) 200 MG PO TB12
ORAL_TABLET | ORAL | 0 refills | Status: DC
Start: 2021-02-27 — End: 2021-08-06
  Filled 2021-02-27: qty 90, 90d supply, fill #0

## 2021-02-27 MED FILL — Hydrochlorothiazide Tab 25 MG: ORAL | 90 days supply | Qty: 90 | Fill #0 | Status: AC

## 2021-02-27 MED FILL — Losartan Potassium Tab 100 MG: ORAL | 90 days supply | Qty: 90 | Fill #0 | Status: AC

## 2021-02-28 ENCOUNTER — Other Ambulatory Visit: Payer: Self-pay

## 2021-03-01 ENCOUNTER — Other Ambulatory Visit: Payer: Self-pay

## 2021-03-05 ENCOUNTER — Other Ambulatory Visit: Payer: Self-pay

## 2021-03-05 MED ORDER — FLUTICASONE PROPIONATE 50 MCG/ACT NA SUSP
2.0000 | Freq: Every day | NASAL | 2 refills | Status: DC
  Filled 2021-03-05: qty 16, 30d supply, fill #0
  Filled 2021-06-05: qty 16, 30d supply, fill #1
  Filled 2021-07-11: qty 16, 30d supply, fill #2

## 2021-03-08 ENCOUNTER — Other Ambulatory Visit: Payer: Self-pay

## 2021-03-08 MED ORDER — ESZOPICLONE 3 MG PO TABS
ORAL_TABLET | ORAL | 0 refills | Status: DC
Start: 2021-03-08 — End: 2021-08-06
  Filled 2021-03-08: qty 90, 90d supply, fill #0

## 2021-03-12 ENCOUNTER — Other Ambulatory Visit: Payer: Self-pay

## 2021-03-12 MED ORDER — AMPHETAMINE-DEXTROAMPHETAMINE 20 MG PO TABS: ORAL_TABLET | ORAL | 0 refills | Status: DC

## 2021-03-12 MED ORDER — SERTRALINE HCL 100 MG PO TABS
ORAL_TABLET | ORAL | 0 refills | Status: DC
Start: 2021-03-12 — End: 2021-08-06
  Filled 2021-03-12 – 2021-06-05 (×2): qty 180, 90d supply, fill #0

## 2021-03-12 MED ORDER — ESZOPICLONE 3 MG PO TABS
ORAL_TABLET | ORAL | 0 refills | Status: DC
Start: 2021-03-12 — End: 2021-08-06

## 2021-03-12 MED ORDER — BUPROPION HCL ER (SR) 200 MG PO TB12
ORAL_TABLET | ORAL | 0 refills | Status: DC
Start: 2021-03-12 — End: 2021-08-06

## 2021-03-12 MED ORDER — HYDROXYZINE PAMOATE 50 MG PO CAPS
ORAL_CAPSULE | ORAL | 0 refills | Status: DC
  Filled 2021-03-12 – 2021-07-11 (×2): qty 90, 90d supply, fill #0

## 2021-04-11 ENCOUNTER — Other Ambulatory Visit: Payer: Self-pay

## 2021-04-11 MED ORDER — HYDROXYZINE PAMOATE 50 MG PO CAPS
ORAL_CAPSULE | ORAL | 0 refills | Status: DC
  Filled 2021-04-11: qty 90, 90d supply, fill #0

## 2021-05-11 ENCOUNTER — Other Ambulatory Visit: Payer: Self-pay

## 2021-05-11 MED ORDER — PANTOPRAZOLE SODIUM 40 MG PO TBEC
DELAYED_RELEASE_TABLET | ORAL | 0 refills | Status: AC
  Filled 2021-05-11: qty 180, 90d supply, fill #0

## 2021-05-16 ENCOUNTER — Other Ambulatory Visit: Payer: Self-pay

## 2021-05-16 MED ORDER — HYDROXYZINE PAMOATE 50 MG PO CAPS
ORAL_CAPSULE | ORAL | 0 refills | Status: DC
  Filled 2021-05-16: qty 90, 90d supply, fill #0

## 2021-05-16 MED ORDER — ESZOPICLONE 3 MG PO TABS
ORAL_TABLET | ORAL | 0 refills | Status: DC
  Filled 2021-05-16 – 2021-06-05 (×2): qty 90, 90d supply, fill #0

## 2021-05-16 MED ORDER — AMPHETAMINE-DEXTROAMPHETAMINE 20 MG PO TABS
ORAL_TABLET | ORAL | 0 refills | Status: DC
  Filled 2021-05-16 – 2021-06-05 (×2): qty 180, 90d supply, fill #0

## 2021-05-16 MED ORDER — SERTRALINE HCL 100 MG PO TABS
ORAL_TABLET | ORAL | 0 refills | Status: AC
  Filled 2021-05-16: qty 180, 90d supply, fill #0

## 2021-05-16 MED ORDER — BUPROPION HCL ER (SR) 200 MG PO TB12
ORAL_TABLET | ORAL | 0 refills | Status: DC
  Filled 2021-05-16: qty 90, 90d supply, fill #0

## 2021-05-22 ENCOUNTER — Other Ambulatory Visit: Payer: Self-pay

## 2021-06-05 ENCOUNTER — Other Ambulatory Visit: Payer: Self-pay

## 2021-06-06 ENCOUNTER — Other Ambulatory Visit: Payer: Self-pay

## 2021-06-07 ENCOUNTER — Other Ambulatory Visit: Payer: Self-pay

## 2021-06-08 ENCOUNTER — Other Ambulatory Visit: Payer: Self-pay

## 2021-06-08 MED ORDER — LOSARTAN POTASSIUM 100 MG PO TABS
100.0000 mg | ORAL_TABLET | Freq: Every day | ORAL | 1 refills | Status: AC
  Filled 2021-06-08: qty 90, 90d supply, fill #0
  Filled 2021-09-05: qty 90, 90d supply, fill #1

## 2021-06-08 MED ORDER — HYDROCHLOROTHIAZIDE 25 MG PO TABS
25.0000 mg | ORAL_TABLET | Freq: Every day | ORAL | 1 refills | Status: AC
  Filled 2021-06-08: qty 90, 90d supply, fill #0
  Filled 2021-09-05: qty 90, 90d supply, fill #1

## 2021-06-22 ENCOUNTER — Other Ambulatory Visit: Payer: Self-pay | Admitting: Family Medicine

## 2021-06-22 ENCOUNTER — Other Ambulatory Visit: Payer: Self-pay

## 2021-06-22 DIAGNOSIS — Z1231 Encounter for screening mammogram for malignant neoplasm of breast: Secondary | ICD-10-CM

## 2021-06-22 MED ORDER — DICLOFENAC SODIUM 1 % EX GEL
2.0000 g | Freq: Four times a day (QID) | CUTANEOUS | 11 refills | Status: DC
  Filled 2021-06-22: qty 100, 12d supply, fill #0
  Filled 2022-03-27: qty 100, 12d supply, fill #1

## 2021-06-25 ENCOUNTER — Other Ambulatory Visit: Payer: Self-pay

## 2021-06-25 MED ORDER — METFORMIN HCL 500 MG PO TABS
ORAL_TABLET | ORAL | 11 refills | Status: DC
  Filled 2021-06-25: qty 60, 30d supply, fill #0
  Filled 2022-03-27: qty 60, 30d supply, fill #1

## 2021-06-25 MED ORDER — ROSUVASTATIN CALCIUM 5 MG PO TABS
ORAL_TABLET | ORAL | 11 refills | Status: AC
  Filled 2021-06-25: qty 30, 30d supply, fill #0
  Filled 2021-07-26: qty 30, 30d supply, fill #1
  Filled 2021-08-27: qty 30, 30d supply, fill #2
  Filled 2021-10-03: qty 30, 30d supply, fill #3

## 2021-06-27 ENCOUNTER — Other Ambulatory Visit: Payer: Self-pay | Admitting: Ophthalmology

## 2021-06-27 DIAGNOSIS — G453 Amaurosis fugax: Secondary | ICD-10-CM

## 2021-07-03 ENCOUNTER — Other Ambulatory Visit: Payer: Self-pay

## 2021-07-03 ENCOUNTER — Ambulatory Visit
Admission: RE | Admit: 2021-07-03 | Discharge: 2021-07-03 | Disposition: A | Discharge status: Home / Self Care | Payer: No Typology Code available for payment source | Source: Ambulatory Visit | Attending: Ophthalmology | Admitting: Ophthalmology

## 2021-07-03 DIAGNOSIS — G453 Amaurosis fugax: Secondary | ICD-10-CM | POA: Insufficient documentation

## 2021-07-05 ENCOUNTER — Other Ambulatory Visit: Payer: Self-pay | Admitting: Family Medicine

## 2021-07-05 ENCOUNTER — Other Ambulatory Visit: Payer: Self-pay

## 2021-07-05 DIAGNOSIS — G453 Amaurosis fugax: Secondary | ICD-10-CM

## 2021-07-05 MED ORDER — TRIAMCINOLONE ACETONIDE 0.1 % EX CREA
TOPICAL_CREAM | Freq: Two times a day (BID) | CUTANEOUS | 0 refills | Status: DC
  Filled 2021-07-05: qty 30, 7d supply, fill #0

## 2021-07-06 ENCOUNTER — Other Ambulatory Visit: Payer: Self-pay | Admitting: Family Medicine

## 2021-07-06 DIAGNOSIS — R2232 Localized swelling, mass and lump, left upper limb: Secondary | ICD-10-CM

## 2021-07-09 ENCOUNTER — Ambulatory Visit: Payer: No Typology Code available for payment source

## 2021-07-11 ENCOUNTER — Other Ambulatory Visit: Payer: Self-pay

## 2021-07-12 ENCOUNTER — Ambulatory Visit
Admission: RE | Admit: 2021-07-12 | Discharge: 2021-07-12 | Disposition: A | Discharge status: Home / Self Care | Payer: No Typology Code available for payment source | Source: Ambulatory Visit | Attending: Family Medicine | Admitting: Family Medicine

## 2021-07-12 ENCOUNTER — Other Ambulatory Visit: Payer: Self-pay

## 2021-07-12 DIAGNOSIS — G453 Amaurosis fugax: Secondary | ICD-10-CM | POA: Insufficient documentation

## 2021-07-13 ENCOUNTER — Ambulatory Visit
Admission: RE | Admit: 2021-07-13 | Discharge: 2021-07-13 | Disposition: A | Discharge status: Home / Self Care | Payer: No Typology Code available for payment source | Source: Ambulatory Visit | Attending: Family Medicine | Admitting: Family Medicine

## 2021-07-13 DIAGNOSIS — R2232 Localized swelling, mass and lump, left upper limb: Secondary | ICD-10-CM

## 2021-07-19 ENCOUNTER — Other Ambulatory Visit: Payer: Self-pay

## 2021-07-19 MED ORDER — ESZOPICLONE 3 MG PO TABS
ORAL_TABLET | ORAL | 0 refills | Status: DC
  Filled 2021-07-19: qty 90, 90d supply, fill #0

## 2021-07-19 MED ORDER — AMPHETAMINE-DEXTROAMPHETAMINE 20 MG PO TABS
ORAL_TABLET | ORAL | 0 refills | Status: DC
  Filled 2021-07-19 – 2021-12-14 (×3): qty 180, 90d supply, fill #0

## 2021-07-19 MED ORDER — BUPROPION HCL ER (SR) 200 MG PO TB12
ORAL_TABLET | ORAL | 0 refills | Status: DC
  Filled 2021-07-19 – 2021-08-27 (×2): qty 90, 90d supply, fill #0

## 2021-07-19 MED ORDER — SERTRALINE HCL 100 MG PO TABS
ORAL_TABLET | ORAL | 0 refills | Status: DC
  Filled 2021-07-19: qty 180, 90d supply, fill #0

## 2021-07-19 MED ORDER — HYDROXYZINE PAMOATE 50 MG PO CAPS
ORAL_CAPSULE | ORAL | 0 refills | Status: DC
  Filled 2021-07-19: qty 90, 90d supply, fill #0

## 2021-07-19 MED ORDER — MOUNJARO 2.5 MG/0.5ML ~~LOC~~ SOAJ
SUBCUTANEOUS | 1 refills | Status: DC
  Filled 2021-07-19: qty 2, 28d supply, fill #0
  Filled 2021-08-27: qty 2, 28d supply, fill #1

## 2021-07-20 ENCOUNTER — Other Ambulatory Visit: Payer: Self-pay | Admitting: General Surgery

## 2021-07-20 NOTE — Progress Notes (Signed)
Labs and Radiology:    July 13, 2021 mammogram and ultrasound:     IMPRESSION: Mildly abnormal nodes in the left axilla with a borderline node on the right.   RECOMMENDATION: Recommend ultrasound-guided biopsy of the most abnormal node on the left with a cortex measuring 4.9 mm.   I have discussed the findings and recommendations with the patient. If applicable, a reminder letter will be sent to the patient regarding the next appointment.   BI-RADS CATEGORY  4: Suspicious.   This study was independently reviewed.   Ultrasound:   Focused ultrasound was undertaken to determine best approach for node biopsy.   The node with the thickened cortex sits almost on the latissimus muscle.  Axillary artery and veins are patent.   June 22, 2021 laboratory: Hemoglobin A1C 4.2 - 5.6 % 6.6 High    Average Blood Glucose (Calc) mg/dL 143     Component Ref Range & Units 4 wk ago   Glucose 70 - 110 mg/dL 98   Sodium 136 - 145 mmol/L 137   Potassium 3.6 - 5.1 mmol/L 3.4 Low    Chloride 97 - 109 mmol/L 99   Carbon Dioxide (CO2) 22.0 - 32.0 mmol/L 28.1   Urea Nitrogen (BUN) 7 - 25 mg/dL 15   Creatinine 0.6 - 1.1 mg/dL 1.0   Glomerular Filtration Rate (eGFR), MDRD Estimate >60 mL/min/1.73sq m 57 Low    Calcium 8.7 - 10.3 mg/dL 9.8   AST  8 - 39 U/L 22   ALT  5 - 38 U/L 28   Alk Phos (alkaline Phosphatase) 34 - 104 U/L 92   Albumin 3.5 - 4.8 g/dL 4.5   Bilirubin, Total 0.3 - 1.2 mg/dL 0.4   Protein, Total 6.1 - 7.9 g/dL 6.6   A/G Ratio 1.0 - 5.0 gm/dL 2.1     Component Ref Range & Units 4 wk ago   WBC (White Blood Cell Count) 4.1 - 10.2 10^3/uL 8.4   RBC (Red Blood Cell Count) 4.04 - 5.48 10^6/uL 4.02 Low    Hemoglobin 12.0 - 15.0 gm/dL 12.6   Hematocrit 35.0 - 47.0 % 35.8   MCV (Mean Corpuscular Volume) 80.0 - 100.0 fl 89.1   MCH (Mean Corpuscular Hemoglobin) 27.0 - 31.2 pg 31.3 High    MCHC (Mean Corpuscular Hemoglobin Concentration) 32.0 - 36.0 gm/dL 35.2   Platelet  Count 150 - 450 10^3/uL 271   RDW-CV (Red Cell Distribution Width) 11.6 - 14.8 % 12.8   MPV (Mean Platelet Volume) 9.4 - 12.4 fl 10.0   Neutrophils 1.50 - 7.80 10^3/uL 4.80   Lymphocytes 1.00 - 3.60 10^3/uL 2.62   Monocytes 0.00 - 1.50 10^3/uL 0.67   Eosinophils 0.00 - 0.55 10^3/uL 0.24   Basophils 0.00 - 0.09 10^3/uL 0.07   Neutrophil % 32.0 - 70.0 % 57.0   Lymphocyte % 10.0 - 50.0 % 31.1   Monocyte % 4.0 - 13.0 % 8.0   Eosinophil % 1.0 - 5.0 % 2.9   Basophil% 0.0 - 2.0 % 0.8   Immature Granulocyte % <=0.7 % 0.2   Immature Granulocyte Count <=0.06 10^3/L 0.02            Objective:   Physical Exam Exam conducted with a chaperone present.  Constitutional:      Appearance: Normal appearance.  Cardiovascular:     Rate and Rhythm: Normal rate and regular rhythm.     Pulses: Normal pulses.     Heart sounds: Normal heart sounds.  Pulmonary:     Effort: Pulmonary effort is normal.     Breath sounds: Normal breath sounds.  Chest:  Breasts:     Right: Normal.     Left: Normal.         Comments: Axillary lymph node identified on ultrasound abutting the latissimus muscle in the mid/lower aspect of the axillary envelope.  This indeed shows a thickened cortex.  There are 2 prominent nodes within about 2 cm of each other in this location. Musculoskeletal:     Cervical back: Neck supple.  Lymphadenopathy:     Upper Body:     Right upper body: No supraclavicular or axillary adenopathy.     Left upper body: No supraclavicular or axillary adenopathy.  Skin:    General: Skin is warm and dry.     Comments: No lesions on the left upper extremity to explain lymphadenopathy.  Neurological:     Mental Status: She is alert and oriented to person, place, and time.  Psychiatric:        Mood and Affect: Mood normal.        Behavior: Behavior normal.           Assessment:     Abnormal lymph nodes on imaging.  Recommendation for biopsy.    Plan:     The patient's long history of  night sweats is of interest, but likely unrelated to the image identified lymphadenopathy on her recent exams.  Considering these episodes of night sweats date back decades, with modest change in location from upper body to lower body in the last 6 months, I suspect this is unrelated, but again of note.   My concern about ultrasound-guided core biopsy of that will be small sample and if the thickened cortex is not adequately appreciated this may be a nondiagnostic study.  If the lymphoma is identified, as there is no indication for solid organ malignancy metastatic to the axilla, there will likely be not enough tissue for phenotype analysis.  In light of this, I have suggested the patient would be best served by a formal open biopsy as an outpatient procedure under anesthesia.  We went over the pros and cons of completing this under local anesthesia, but with the 3 cm depth I think that she would be a much more comfortable patient with general anesthesia.   We did have a discussion of episodes of amaurosis and while she was not quite linking the idea of the episode with a stroke, we reviewed the anatomy, I reviewed the ultrasound report with her and while there was no stenosis there is certainly is likely plaquing that is or was the source of the embolic event.   She is "prediabetic" and even with a modest smoking this can certainly accelerate atherosclerotic changes.  Whether she will be able to drop off her cigarettes is unclear.  She was strongly encouraged to be compliant with the recently initiated Crestor prescription.   Surgery is tentatively scheduled for August 10, 2021.      This note is partially prepared by Karie Fetch, RN, acting as a scribe in the presence of Dr. Hervey Ard, MD.  The documentation recorded by the scribe accurately reflects the service I personally performed and the decisions made by me.    Robert Bellow, MD FACS

## 2021-07-26 ENCOUNTER — Other Ambulatory Visit: Payer: Self-pay

## 2021-08-03 ENCOUNTER — Inpatient Hospital Stay: Admission: RE | Admit: 2021-08-03 | Payer: No Typology Code available for payment source | Source: Ambulatory Visit

## 2021-08-09 ENCOUNTER — Other Ambulatory Visit: Payer: Self-pay

## 2021-08-09 ENCOUNTER — Other Ambulatory Visit
Admission: RE | Admit: 2021-08-09 | Discharge: 2021-08-09 | Disposition: A | Discharge status: Home / Self Care | Payer: No Typology Code available for payment source | Source: Ambulatory Visit | Attending: General Surgery | Admitting: General Surgery

## 2021-08-09 HISTORY — DX: Prediabetes: R73.03

## 2021-08-09 HISTORY — DX: Unspecified osteoarthritis, unspecified site: M19.90

## 2021-08-09 HISTORY — DX: Cerebral infarction, unspecified: I63.9

## 2021-08-09 HISTORY — DX: Essential (primary) hypertension: I10

## 2021-08-09 NOTE — Patient Instructions (Addendum)
Your procedure is scheduled on: 08/17/21 - Friday Report to the Registration Desk on the 1st floor of the [facility]. To find out your arrival time, please call [phone] between 1PM - 3PM on: 08/16/21 - Thursday  REMEMBER: Instructions that are not followed completely may result in serious medical risk, up to and including death; or upon the discretion of your surgeon and anesthesiologist your surgery may need to be rescheduled.  Do not eat food after midnight the night before surgery.  No gum chewing, lozengers or hard candies.  You may however, drink CLEAR liquids up to 2 hours before you are scheduled to arrive for your surgery. Do not drink anything within 2 hours of your scheduled arrival time.  Clear liquids include: - water  - apple juice without pulp - gatorade (not RED, PURPLE, OR BLUE) - black coffee or tea (Do NOT add milk or creamers to the coffee or tea) Do NOT drink anything that is not on this list.  TAKE THESE MEDICATIONS THE MORNING OF SURGERY WITH A SIP OF WATER:  - pantoprazole (PROTONIX) 40 MG tablet, (take one the night before and one on the morning of surgery - helps to prevent nausea after surgery.) - ADDERALL 20 MG tablet - fluticasone (FLONASE) 50 MCG/ACT nasal spray   Follow recommendations from Cardiologist, Pulmonologist or PCP regarding stopping Aspirin, Coumadin, Plavix, Eliquis, Pradaxa, or Pletal. Continue taking Aspirin 81 mg but do not take the day of surgery.  One week prior to surgery: Stop Anti-inflammatories (NSAIDS) such as Advil, Aleve, Ibuprofen, Motrin, Naproxen, Naprosyn and Aspirin based products such as Excedrin, Goodys Powder, BC Powder.  Stop ANY OVER THE COUNTER supplements until after surgery.  You may however, continue to take Tylenol if needed for pain up until the day of surgery.  No Alcohol for 24 hours before or after surgery.  No Smoking including e-cigarettes for 24 hours prior to surgery.  No chewable tobacco  products for at least 6 hours prior to surgery.  No nicotine patches on the day of surgery.  Do not use any "recreational" drugs for at least a week prior to your surgery.  Please be advised that the combination of cocaine and anesthesia may have negative outcomes, up to and including death. If you test positive for cocaine, your surgery will be cancelled.  On the morning of surgery brush your teeth with toothpaste and water, you may rinse your mouth with mouthwash if you wish. Do not swallow any toothpaste or mouthwash.  Use CHG Soap or wipes as directed on instruction sheet.  Do not wear jewelry, make-up, hairpins, clips or nail polish.  Do not wear lotions, powders, or perfumes.   Do not shave body from the neck down 48 hours prior to surgery just in case you cut yourself which could leave a site for infection.  Also, freshly shaved skin may become irritated if using the CHG soap.  Contact lenses, hearing aids and dentures may not be worn into surgery.  Do not bring valuables to the hospital. [facility] is not responsible for any missing/lost belongings or valuables.   Notify your doctor if there is any change in your medical condition (cold, fever, infection).  Wear comfortable clothing (specific to your surgery type) to the hospital.  After surgery, you can help prevent lung complications by doing breathing exercises.  Take deep breaths and cough every 1-2 hours. Your doctor may order a device called an Incentive Spirometer to help you take deep breaths. When  coughing or sneezing, hold a pillow firmly against your incision with both hands. This is called "splinting." Doing this helps protect your incision. It also decreases belly discomfort.  If you are being admitted to the hospital overnight, leave your suitcase in the car. After surgery it may be brought to your room.  If you are being discharged the day of surgery, you will not be allowed to drive home. You will need a  responsible adult (18 years or older) to drive you home and stay with you that night.   If you are taking public transportation, you will need to have a responsible adult (18 years or older) with you. Please confirm with your physician that it is acceptable to use public transportation.   Please call the Pre-admissions Testing Dept. at [phone] if you have any questions about these instructions.  Surgery Visitation Policy:  Patients undergoing a surgery or procedure may have one family member or support person with them as long as that person is not COVID-19 positive or experiencing its symptoms.  That person may remain in the waiting area during the procedure and may rotate out with other people.  Inpatient Visitation:    Visiting hours are 7 a.m. to 8 p.m. Up to two visitors ages 16+ are allowed at one time in a patient room. The visitors may rotate out with other people during the day. Visitors must check out when they leave, or other visitors will not be allowed. One designated support person may remain overnight. The visitor must pass COVID-19 screenings, use hand sanitizer when entering and exiting the patient's room and wear a mask at all times, including in the patient's room. Patients must also wear a mask when staff or their visitor are in the room. Masking is required regardless of vaccination status.

## 2021-08-10 ENCOUNTER — Other Ambulatory Visit
Admission: RE | Admit: 2021-08-10 | Discharge: 2021-08-10 | Disposition: A | Discharge status: Home / Self Care | Payer: No Typology Code available for payment source | Source: Ambulatory Visit | Attending: General Surgery | Admitting: General Surgery

## 2021-08-10 DIAGNOSIS — Z01810 Encounter for preprocedural cardiovascular examination: Secondary | ICD-10-CM | POA: Insufficient documentation

## 2021-08-17 ENCOUNTER — Ambulatory Visit: Payer: No Typology Code available for payment source | Admitting: Registered Nurse

## 2021-08-17 ENCOUNTER — Encounter: Payer: Self-pay | Admitting: General Surgery

## 2021-08-17 ENCOUNTER — Encounter
Admission: RE | Disposition: A | Discharge status: Home / Self Care | Payer: Self-pay | Source: Home / Self Care | Attending: General Surgery

## 2021-08-17 ENCOUNTER — Other Ambulatory Visit: Payer: Self-pay

## 2021-08-17 ENCOUNTER — Ambulatory Visit
Admission: RE | Admit: 2021-08-17 | Discharge: 2021-08-17 | Disposition: A | Discharge status: Home / Self Care | Payer: No Typology Code available for payment source | Attending: General Surgery | Admitting: General Surgery

## 2021-08-17 DIAGNOSIS — R590 Localized enlarged lymph nodes: Secondary | ICD-10-CM | POA: Diagnosis not present

## 2021-08-17 DIAGNOSIS — R7303 Prediabetes: Secondary | ICD-10-CM | POA: Insufficient documentation

## 2021-08-17 DIAGNOSIS — K219 Gastro-esophageal reflux disease without esophagitis: Secondary | ICD-10-CM | POA: Diagnosis not present

## 2021-08-17 DIAGNOSIS — F17200 Nicotine dependence, unspecified, uncomplicated: Secondary | ICD-10-CM | POA: Insufficient documentation

## 2021-08-17 DIAGNOSIS — J449 Chronic obstructive pulmonary disease, unspecified: Secondary | ICD-10-CM | POA: Insufficient documentation

## 2021-08-17 DIAGNOSIS — I10 Essential (primary) hypertension: Secondary | ICD-10-CM | POA: Diagnosis not present

## 2021-08-17 HISTORY — PX: AXILLARY LYMPH NODE BIOPSY: SHX5737

## 2021-08-17 SURGERY — AXILLARY LYMPH NODE BIOPSY
Anesthesia: General | Site: Axilla | Laterality: Left

## 2021-08-17 MED ORDER — CHLORHEXIDINE GLUCONATE CLOTH 2 % EX PADS: 6.0000 | MEDICATED_PAD | Freq: Once | CUTANEOUS | Status: DC

## 2021-08-17 MED ORDER — OXYCODONE HCL 5 MG PO TABS
ORAL_TABLET | ORAL | Status: AC
  Filled 2021-08-17: qty 1

## 2021-08-17 MED ORDER — CEFAZOLIN SODIUM-DEXTROSE 1-4 GM/50ML-% IV SOLN
INTRAVENOUS | Status: AC
  Filled 2021-08-17: qty 50

## 2021-08-17 MED ORDER — ONDANSETRON HCL 4 MG/2ML IJ SOLN: 4.0000 mg | Freq: Once | INTRAMUSCULAR | Status: DC | PRN

## 2021-08-17 MED ORDER — EPHEDRINE SULFATE 50 MG/ML IJ SOLN
INTRAMUSCULAR | Status: DC | PRN
  Administered 2021-08-17: 10 mg via INTRAVENOUS
  Administered 2021-08-17: 5 mg via INTRAVENOUS

## 2021-08-17 MED ORDER — ACETAMINOPHEN 10 MG/ML IV SOLN
INTRAVENOUS | Status: DC | PRN
  Administered 2021-08-17: 1000 mg via INTRAVENOUS

## 2021-08-17 MED ORDER — LACTATED RINGERS IV SOLN: INTRAVENOUS | Status: DC

## 2021-08-17 MED ORDER — FENTANYL CITRATE (PF) 100 MCG/2ML IJ SOLN: 25.0000 ug | INTRAMUSCULAR | Status: DC | PRN

## 2021-08-17 MED ORDER — LIDOCAINE HCL (CARDIAC) PF 100 MG/5ML IV SOSY
PREFILLED_SYRINGE | INTRAVENOUS | Status: DC | PRN
  Administered 2021-08-17: 20 mg via INTRAVENOUS
  Administered 2021-08-17: 80 mg via INTRAVENOUS

## 2021-08-17 MED ORDER — FENTANYL CITRATE (PF) 100 MCG/2ML IJ SOLN
INTRAMUSCULAR | Status: DC | PRN
  Administered 2021-08-17 (×2): 25 ug via INTRAVENOUS

## 2021-08-17 MED ORDER — PROPOFOL 10 MG/ML IV BOLUS
INTRAVENOUS | Status: AC
  Filled 2021-08-17: qty 20

## 2021-08-17 MED ORDER — ONDANSETRON HCL 4 MG/2ML IJ SOLN
INTRAMUSCULAR | Status: DC | PRN
  Administered 2021-08-17: 4 mg via INTRAVENOUS

## 2021-08-17 MED ORDER — 0.9 % SODIUM CHLORIDE (POUR BTL) OPTIME
TOPICAL | Status: DC | PRN
  Administered 2021-08-17: 500 mL

## 2021-08-17 MED ORDER — CHLORHEXIDINE GLUCONATE CLOTH 2 % EX PADS
6.0000 | MEDICATED_PAD | Freq: Once | CUTANEOUS | Status: AC
  Administered 2021-08-17: 6 via TOPICAL

## 2021-08-17 MED ORDER — METHYLENE BLUE 0.5 % INJ SOLN
INTRAVENOUS | Status: AC
  Filled 2021-08-17: qty 10

## 2021-08-17 MED ORDER — BUPIVACAINE-EPINEPHRINE (PF) 0.5% -1:200000 IJ SOLN
INTRAMUSCULAR | Status: AC
  Filled 2021-08-17: qty 30

## 2021-08-17 MED ORDER — ACETAMINOPHEN 10 MG/ML IV SOLN
INTRAVENOUS | Status: AC
  Filled 2021-08-17: qty 100

## 2021-08-17 MED ORDER — FENTANYL CITRATE (PF) 100 MCG/2ML IJ SOLN
INTRAMUSCULAR | Status: AC
  Filled 2021-08-17: qty 2

## 2021-08-17 MED ORDER — PROPOFOL 10 MG/ML IV BOLUS
INTRAVENOUS | Status: DC | PRN
  Administered 2021-08-17: 120 mg via INTRAVENOUS

## 2021-08-17 MED ORDER — OXYCODONE HCL 5 MG PO TABS
5.0000 mg | ORAL_TABLET | Freq: Once | ORAL | Status: AC | PRN
  Administered 2021-08-17: 5 mg via ORAL

## 2021-08-17 MED ORDER — DEXAMETHASONE SODIUM PHOSPHATE 10 MG/ML IJ SOLN
INTRAMUSCULAR | Status: AC
  Filled 2021-08-17: qty 1

## 2021-08-17 MED ORDER — MIDAZOLAM HCL 2 MG/2ML IJ SOLN
INTRAMUSCULAR | Status: DC | PRN
  Administered 2021-08-17: 2 mg via INTRAVENOUS

## 2021-08-17 MED ORDER — BUPIVACAINE-EPINEPHRINE 0.5% -1:200000 IJ SOLN
INTRAMUSCULAR | Status: DC | PRN
  Administered 2021-08-17: 20 mL

## 2021-08-17 MED ORDER — DEXAMETHASONE SODIUM PHOSPHATE 10 MG/ML IJ SOLN
INTRAMUSCULAR | Status: DC | PRN
  Administered 2021-08-17: 4 mg via INTRAVENOUS

## 2021-08-17 MED ORDER — EPHEDRINE 5 MG/ML INJ
INTRAVENOUS | Status: AC
  Filled 2021-08-17: qty 5

## 2021-08-17 MED ORDER — MIDAZOLAM HCL 2 MG/2ML IJ SOLN
INTRAMUSCULAR | Status: AC
  Filled 2021-08-17: qty 2

## 2021-08-17 MED ORDER — CHLORHEXIDINE GLUCONATE 0.12 % MT SOLN: 15.0000 mL | Freq: Once | OROMUCOSAL | Status: AC

## 2021-08-17 MED ORDER — ACETAMINOPHEN 10 MG/ML IV SOLN: 1000.0000 mg | Freq: Once | INTRAVENOUS | Status: DC | PRN

## 2021-08-17 MED ORDER — HYDROCODONE-ACETAMINOPHEN 5-325 MG PO TABS
1.0000 | ORAL_TABLET | ORAL | 0 refills | Status: AC | PRN
  Filled 2021-08-17: qty 10, 2d supply, fill #0

## 2021-08-17 MED ORDER — CEFAZOLIN SODIUM-DEXTROSE 2-3 GM-%(50ML) IV SOLR
INTRAVENOUS | Status: DC | PRN
  Administered 2021-08-17: 2 g via INTRAVENOUS

## 2021-08-17 MED ORDER — CHLORHEXIDINE GLUCONATE 0.12 % MT SOLN
OROMUCOSAL | Status: AC
  Administered 2021-08-17: 15 mL via OROMUCOSAL
  Filled 2021-08-17: qty 15

## 2021-08-17 MED ORDER — ORAL CARE MOUTH RINSE: 15.0000 mL | Freq: Once | OROMUCOSAL | Status: AC

## 2021-08-17 MED ORDER — ONDANSETRON HCL 4 MG/2ML IJ SOLN
INTRAMUSCULAR | Status: AC
  Filled 2021-08-17: qty 2

## 2021-08-17 MED ORDER — OXYCODONE HCL 5 MG/5ML PO SOLN: 5.0000 mg | Freq: Once | ORAL | Status: AC | PRN

## 2021-08-17 MED ORDER — LIDOCAINE HCL (PF) 2 % IJ SOLN
INTRAMUSCULAR | Status: AC
  Filled 2021-08-17: qty 5

## 2021-08-17 SURGICAL SUPPLY — 38 items
APL PRP STRL LF DISP 70% ISPRP (MISCELLANEOUS) ×1
APPLIER CLIP 11 MED OPEN (CLIP) ×2
APR CLP MED 11 20 MLT OPN (CLIP) ×1
BLADE SURG 15 STRL SS SAFETY (BLADE) ×2 IMPLANT
CHLORAPREP W/TINT 26 (MISCELLANEOUS) ×2 IMPLANT
CLIP APPLIE 11 MED OPEN (CLIP) IMPLANT
DRAPE LAPAROTOMY TRNSV 106X77 (MISCELLANEOUS) ×2 IMPLANT
DRSG TEGADERM 4X4.75 (GAUZE/BANDAGES/DRESSINGS) ×2 IMPLANT
DRSG TELFA 4X3 1S NADH ST (GAUZE/BANDAGES/DRESSINGS) ×2 IMPLANT
ELECT REM PT RETURN 9FT ADLT (ELECTROSURGICAL) ×2
ELECTRODE REM PT RTRN 9FT ADLT (ELECTROSURGICAL) ×1 IMPLANT
GAUZE 4X4 16PLY ~~LOC~~+RFID DBL (SPONGE) ×2 IMPLANT
GLOVE SURG ENC MOIS LTX SZ7.5 (GLOVE) ×2 IMPLANT
GLOVE SURG UNDER LTX SZ8 (GLOVE) ×2 IMPLANT
GOWN STRL REUS W/ TWL LRG LVL3 (GOWN DISPOSABLE) ×2 IMPLANT
GOWN STRL REUS W/TWL LRG LVL3 (GOWN DISPOSABLE) ×4
KIT TURNOVER KIT A (KITS) ×2 IMPLANT
LABEL OR SOLS (LABEL) ×2 IMPLANT
MANIFOLD NEPTUNE II (INSTRUMENTS) ×2 IMPLANT
NDL FILTER BLUNT 18X1 1/2 (NEEDLE) IMPLANT
NDL HYPO 25X1 1.5 SAFETY (NEEDLE) ×1 IMPLANT
NEEDLE FILTER BLUNT 18X 1/2SAF (NEEDLE)
NEEDLE FILTER BLUNT 18X1 1/2 (NEEDLE) IMPLANT
NEEDLE HYPO 25X1 1.5 SAFETY (NEEDLE) ×2 IMPLANT
NS IRRIG 500ML POUR BTL (IV SOLUTION) ×2 IMPLANT
PACK BASIN MINOR ARMC (MISCELLANEOUS) ×2 IMPLANT
RETRACTOR RING XSMALL (MISCELLANEOUS) IMPLANT
RTRCTR WOUND ALEXIS 13CM XS SH (MISCELLANEOUS) ×2
SHEARS FOC LG CVD HARMONIC 17C (MISCELLANEOUS) IMPLANT
STRIP CLOSURE SKIN 1/2X4 (GAUZE/BANDAGES/DRESSINGS) ×2 IMPLANT
SUT VIC AB 2-0 CT1 27 (SUTURE) ×2
SUT VIC AB 2-0 CT1 TAPERPNT 27 (SUTURE) ×1 IMPLANT
SUT VIC AB 3-0 54X BRD REEL (SUTURE) ×1 IMPLANT
SUT VIC AB 3-0 BRD 54 (SUTURE) ×2
SUT VIC AB 4-0 PS2 18 (SUTURE) ×2 IMPLANT
SWABSTK COMLB BENZOIN TINCTURE (MISCELLANEOUS) ×2 IMPLANT
SYR 10ML LL (SYRINGE) ×2 IMPLANT
WATER STERILE IRR 500ML POUR (IV SOLUTION) ×2 IMPLANT

## 2021-08-17 NOTE — Discharge Instructions (Signed)

## 2021-08-17 NOTE — H&P (Addendum)
Dana Benson [id number] 1961/01/16     HPI:  Healthy 60 y.o with left axillary adenopathy of unknown etiology. For node excision.   From original assessment:   Subjective:   Patient ID: Dana Benson is a 60 y.o. female.  HPI  The following portions of the patient's history were reviewed and updated as appropriate.  This a new patient is here today for: office visit. Here for evaluation of abnormal mammogram referred by Paulita Cradle PA and axillary lymph nodes. She states she had originally noticed a visual "fullness" at the anterior superior aspect of the left axilla before COVID. No tenderness. Difficult to ascertain if there is been any change since initial discovery.  She denies any breast pain.  She states her last mammogram was in 2017 when she had right nipple discharge but none since.  Present mammogram and ultrasound failed to show any abnormality in the area of patient concern, but did raise questions about axillary lymph nodes.  The patient has had a long history of night sweats, in years past this typically involve the upper body and she would need to change her night close, over the last 6 months it is usually below the waistline necessitating changing underwear.  This states originally back to shortly after her hysterectomy.  The patient had a episodes of amaurosis fugax earlier this month. Evaluation by ophthalmology was negative. Ultrasound of the carotid showed no stenosis although did show plaquing in the carotid bulb. MRI was negative.  The patient reports that she smokes 3-5 cigarettes/day. Maximum smoking exposure 10 cigarettes/day over the decades.  Employed in Engineer, production for [employer].  Review of Systems  Constitutional: Negative for chills and fever.  Respiratory: Negative for cough.   Chief Complaint  Patient presents with   Lymphadenopathy    BP 114/68  Pulse 60  Temp 36.3 C (97.3 F)  Ht 162.6 cm (5' 4" )   Wt 73.5 kg (162 lb)  SpO2 97%  BMI 27.81 kg/m  Labs and Radiology:   July 13, 2021 mammogram and ultrasound:    IMPRESSION: Mildly abnormal nodes in the left axilla with a borderline node on the right.   RECOMMENDATION: Recommend ultrasound-guided biopsy of the most abnormal node on the left with a cortex measuring 4.9 mm.   I have discussed the findings and recommendations with the patient. If applicable, a reminder letter will be sent to the patient regarding the next appointment.   BI-RADS CATEGORY 4: Suspicious.   This study was independently reviewed.  Ultrasound:  Focused ultrasound was undertaken to determine best approach for node biopsy.  The node with the thickened cortex sits almost on the latissimus muscle. Axillary artery and veins are patent.  June 22, 2021 laboratory: Hemoglobin A1C 4.2 - 5.6 % 6.6 High   Average Blood Glucose (Calc) mg/dL 143   Component Ref Range & Units 4 wk ago  Glucose 70 - 110 mg/dL 98  Sodium 136 - 145 mmol/L 137  Potassium 3.6 - 5.1 mmol/L 3.4 Low   Chloride 97 - 109 mmol/L 99  Carbon Dioxide (CO2) 22.0 - 32.0 mmol/L 28.1  Urea Nitrogen (BUN) 7 - 25 mg/dL 15  Creatinine 0.6 - 1.1 mg/dL 1.0  Glomerular Filtration Rate (eGFR), MDRD Estimate >60 mL/min/1.73sq m 57 Low   Calcium 8.7 - 10.3 mg/dL 9.8  AST 8 - 39 U/L 22  ALT 5 - 38 U/L 28  Alk Phos (alkaline Phosphatase) 34 - 104 U/L 92  Albumin 3.5 -  4.8 g/dL 4.5  Bilirubin, Total 0.3 - 1.2 mg/dL 0.4  Protein, Total 6.1 - 7.9 g/dL 6.6  A/G Ratio 1.0 - 5.0 gm/dL 2.1   Component Ref Range & Units 4 wk ago  WBC (White Blood Cell Count) 4.1 - 10.2 10^3/uL 8.4  RBC (Red Blood Cell Count) 4.04 - 5.48 10^6/uL 4.02 Low   Hemoglobin 12.0 - 15.0 gm/dL 12.6  Hematocrit 35.0 - 47.0 % 35.8  MCV (Mean Corpuscular Volume) 80.0 - 100.0 fl 89.1  MCH (Mean Corpuscular Hemoglobin) 27.0 - 31.2 pg 31.3 High   MCHC (Mean Corpuscular Hemoglobin Concentration) 32.0 - 36.0 gm/dL 35.2   Platelet Count 150 - 450 10^3/uL 271  RDW-CV (Red Cell Distribution Width) 11.6 - 14.8 % 12.8  MPV (Mean Platelet Volume) 9.4 - 12.4 fl 10.0  Neutrophils 1.50 - 7.80 10^3/uL 4.80  Lymphocytes 1.00 - 3.60 10^3/uL 2.62  Monocytes 0.00 - 1.50 10^3/uL 0.67  Eosinophils 0.00 - 0.55 10^3/uL 0.24  Basophils 0.00 - 0.09 10^3/uL 0.07  Neutrophil % 32.0 - 70.0 % 57.0  Lymphocyte % 10.0 - 50.0 % 31.1  Monocyte % 4.0 - 13.0 % 8.0  Eosinophil % 1.0 - 5.0 % 2.9  Basophil% 0.0 - 2.0 % 0.8  Immature Granulocyte % <=0.7 % 0.2  Immature Granulocyte Count <=0.06 10^3/L 0.02    Objective:  Physical Exam Exam conducted with a chaperone present.  Constitutional:  Appearance: Normal appearance.  Cardiovascular:  Rate and Rhythm: Normal rate and regular rhythm.  Pulses: Normal pulses.  Heart sounds: Normal heart sounds.  Pulmonary:  Effort: Pulmonary effort is normal.  Breath sounds: Normal breath sounds.  Chest:  Breasts:  Right: Normal.  Left: Normal.   Comments: Axillary lymph node identified on ultrasound abutting the latissimus muscle in the mid/lower aspect of the axillary envelope. This indeed shows a thickened cortex. There are 2 prominent nodes within about 2 cm of each other in this location. Musculoskeletal:  Cervical back: Neck supple.  Lymphadenopathy:  Upper Body:  Right upper body: No supraclavicular or axillary adenopathy.  Left upper body: No supraclavicular or axillary adenopathy.  Skin: General: Skin is warm and dry.  Comments: No lesions on the left upper extremity to explain lymphadenopathy.  Neurological:  Mental Status: She is alert and oriented to person, place, and time.  Psychiatric:  Mood and Affect: Mood normal.  Behavior: Behavior normal.    Assessment:   Abnormal lymph nodes on imaging. Recommendation for biopsy.  Plan:   The patient's long history of night sweats is of interest, but likely unrelated to the image identified lymphadenopathy on her  recent exams. Considering these episodes of night sweats date back decades, with modest change in location from upper body to lower body in the last 6 months, I suspect this is unrelated, but again of note.  My concern about ultrasound-guided core biopsy of that will be small sample and if the thickened cortex is not adequately appreciated this may be a nondiagnostic study. If the lymphoma is identified, as there is no indication for solid organ malignancy metastatic to the axilla, there will likely be not enough tissue for phenotype analysis. In light of this, I have suggested the patient would be best served by a formal open biopsy as an outpatient procedure under anesthesia. We went over the pros and cons of completing this under local anesthesia, but with the 3 cm depth I think that she would be a much more comfortable patient with general anesthesia.  We did have  a discussion of episodes of amaurosis and while she was not quite linking the idea of the episode with a stroke, we reviewed the anatomy, I reviewed the ultrasound report with her and while there was no stenosis there is certainly is likely plaquing that is or was the source of the embolic event.  She is "prediabetic" and even with a modest smoking this can certainly accelerate atherosclerotic changes. Whether she will be able to drop off her cigarettes is unclear. She was strongly encouraged to be compliant with the recently initiated Crestor prescription.  Surgery is tentatively scheduled for August 10, 2021.   This note is partially prepared by Karie Fetch, RN, acting as a scribe in the presence of Dr. Hervey Ard, MD.  The documentation recorded by the scribe accurately reflects the service I personally performed and the decisions made by me.   Robert Bellow, MD FACS   Medications Prior to Admission  Medication Sig Dispense Refill Last Dose   acetaminophen (TYLENOL) 500 MG tablet Take 500 mg by mouth every 6 (six)  hours as needed for mild pain or headache.   08/09/2021   amphetamine-dextroamphetamine (ADDERALL) 20 MG tablet Take 1 twice daily  at 8am and 1pm 180 tablet 0 08/16/2021   aspirin EC 81 MG tablet Take 81 mg by mouth daily. Swallow whole.   08/16/2021   buPROPion (WELLBUTRIN SR) 200 MG 12 hr tablet Take 1 dailywith breakfast 90 tablet 0 08/16/2021   cetirizine (ZYRTEC) 10 MG tablet Take 10 mg by mouth daily.   08/16/2021   cyanocobalamin (,VITAMIN B-12,) 1000 MCG/ML injection Inject 1,000 mcg into the muscle every 14 (fourteen) days.   08/09/2021   diclofenac Sodium (VOLTAREN) 1 % GEL Apply 2 g topically 4 (four) times daily (Patient taking differently: Apply 2 g topically 4 (four) times daily as needed (pain).) 100 g 11 Past Week   Eszopiclone 3 MG TABS TAKE 1 TABLET BY MOUTH ONCE DAILY AT BEDTIME AS NEEDED 30 tablet 2    fluticasone (FLONASE) 50 MCG/ACT nasal spray USE 2 SPRAYS IN EACH NOSTRIL ONCE DAILY 16 g 2 08/16/2021   hydrochlorothiazide (HYDRODIURIL) 25 MG tablet TAKE 1 TABLET BY MOUTH ONCE DAILY 90 tablet 1 08/16/2021   hydrOXYzine (VISTARIL) 50 MG capsule Take 1 CAPSULE by mouth at bedtime as needed (Patient taking differently: Take 50 mg by mouth at bedtime.) 90 capsule 0 08/16/2021   ibuprofen (ADVIL) 200 MG tablet Take 400-800 mg by mouth every 6 (six) hours as needed for mild pain.   Past Month   losartan (COZAAR) 100 MG tablet TAKE 1 TABLET BY MOUTH ONCE DAILY 90 tablet 1 08/16/2021   pantoprazole (PROTONIX) 40 MG tablet TAKE 1 TABLET BY MOUTH TWO TIMES DAILY BEFORE MEALS (Patient taking differently: Take 40 mg by mouth daily with breakfast.) 180 tablet 0 08/17/2021 at 0645   rosuvastatin (CRESTOR) 5 MG tablet Take 1 tablet (5 mg total) by mouth once daily 30 tablet 11 08/16/2021   sertraline (ZOLOFT) 100 MG tablet Take 2 daily wiht breakfast 180 tablet 0 08/16/2021   tirzepatide (MOUNJARO) 2.5 MG/0.5ML Pen Inject 2.5 mg subcutaneously every 7 (seven) days 2 mL 1 08/13/2021    triamcinolone cream (KENALOG) 0.1 % Apply topically 2 (two) times daily (Patient taking differently: Apply 1 application topically 2 (two) times daily as needed (rash).) 30 g 0 Past Week   metFORMIN (GLUCOPHAGE) 500 MG tablet Take 1 tablet (500 mg total) by mouth 2 (two) times daily with meals (Patient not  taking: No sig reported) 60 tablet 11 Not Taking   Allergies  Allergen Reactions   Prozac [Fluoxetine Hcl] Other (See Comments)    GI issues   Trazodone And Nefazodone Other (See Comments)    Nightmares   Prednisone Anxiety   Past Medical History:  Diagnosis Date   ADD (attention deficit disorder with hyperactivity)    Allergic rhinitis    Anxiety    Arthritis    Depression    GERD (gastroesophageal reflux disease)    History of chicken pox    Hypertension    Pre-diabetes    Stroke ([place])    2022 mini stroke   Past Surgical History:  Procedure Laterality Date   APPENDECTOMY  09/30/1977   BLADDER SUSPENSION  09/30/1993   during hysterectomy   CHOLECYSTECTOMY  10/01/1983   COLONOSCOPY     TONSILLECTOMY  09/30/1968   adnoids   VAGINAL HYSTERECTOMY  09/30/1993   Social History   Socioeconomic History   Marital status: Single    Spouse name: Not on file   Number of children: 3   Years of education: Not on file   Highest education level: Not on file  Occupational History    Employer: [employer]  Tobacco Use   Smoking status: Every Day    Packs/day: 0.25    Years: 20.00    Pack years: 5.00    Types: Cigarettes   Smokeless tobacco: Never   Tobacco comments:    2 packs a week  Substance and Sexual Activity   Alcohol use: Yes    Alcohol/week: 1.0 - 2.0 standard drink    Types: 1 - 2 Glasses of wine per week    Comment: Wine HS occasional   Drug use: No   Sexual activity: Never  Other Topics Concern   Not on file  Social History Narrative   Lives in [place]. Works at [employer].   Social Determinants of Health   Financial Resource Strain: Not on file  Food  Insecurity: Not on file  Transportation Needs: Not on file  Physical Activity: Not on file  Stress: Not on file  Social Connections: Not on file  Intimate Partner Violence: Not on file   Social History   Social History Narrative   Lives in [place]. Works at [employer].     ROS: Negative.     PE: HEENT: Negative. Lungs: Clear. Cardio: RR. Left axilla: palpable node.  Assessment/Plan:  Proceed with planned left axillary node biopsy.    Forest Gleason [facility] 08/17/2021

## 2021-08-17 NOTE — Transfer of Care (Signed)
Immediate Anesthesia Transfer of Care Note  Patient: Dana Benson  Procedure(s) Performed: AXILLARY LYMPH NODE BIOPSY (Left: Axilla)  Patient Location: PACU  Anesthesia Type:General  Level of Consciousness: drowsy and patient cooperative  Airway & Oxygen Therapy: Patient Spontanous Breathing  Post-op Assessment: Report given to RN and Post -op Vital signs reviewed and stable  Post vital signs: Reviewed and stable  Last Vitals:  Vitals Value Taken Time  BP 102/59 08/17/21 0938  Temp    Pulse 88 08/17/21 0940  Resp 20 08/17/21 0940  SpO2 99 % 08/17/21 0940  Vitals shown include unvalidated device data.  Last Pain:  Vitals:   08/17/21 0804  TempSrc: Temporal  PainSc: 0-No pain         Complications: No notable events documented.

## 2021-08-17 NOTE — Op Note (Signed)
Preoperative diagnosis: Left axillary lymphadenopathy.  Postoperative diagnosis: Same.  Operative procedure: Excision left axillary lymph node with ultrasound guidance.  Operating Surgeon: Donnalee Curry, MD.  Anesthesia: General by LMA, Marcaine 0.5% with 1: 200,000 units of epinephrine, 20 cc.  Estimated blood loss less than 5 cc.  Clinical note: This 60 year old woman was noted with a fullness in the left axilla and subsequent ultrasound showed an enlarged lymph node with atypical cortex.  Based on her clinical history and location she was felt to be a candidate for surgical excision rather than core biopsy.  Operative note: The patient did receive Ancef prior to the procedure.  On induction of anesthesia the area was cleansed with ChloraPrep and draped.  Small roll was placed behind the left scapula.  Ultrasound was used to identify the enlarged lymph node and image obtained for the permanent record.  Local anesthesia was infiltrated with half placed into the skin and the other half used as a pectoralis block.  A skin line incision was made along the base of the axillary skin fold and carried down through skin subtendinous tissue.  Hemostasis with electrocautery and 3-0 Vicryl ties.  A extra small Alexis wound protector was placed to provide better exposure to the axillary contents.  The enlarged node was grasped with a Allis clamp and elevated into the surgical field where it was removed with cautery.  3-0 Vicryl for control of the pedicle.  The deep tissue was approximated with a 2-0 Vicryl figure-of-eight suture as well as the superficial adipose tissue.  The skin was closed with a running 4-0 Vicryl subcuticular suture.  Benzoin, Steri-Strips, Telfa and Tegaderm dressing applied.  Patient tolerated the procedure well and was taken to recovery in stable condition.

## 2021-08-17 NOTE — Anesthesia Preprocedure Evaluation (Addendum)
Anesthesia Evaluation  Patient identified by MRN, date of birth, ID band Patient awake    Reviewed: Allergy & Precautions, NPO status , Patient's Chart, lab work & pertinent test results  History of Anesthesia Complications Negative for: history of anesthetic complications  Airway Mallampati: III  TM Distance: >3 FB Neck ROM: full    Dental  (+) Chipped, Poor Dentition   Pulmonary shortness of breath and with exertion, COPD, Current Smoker and Patient abstained from smoking.,    Pulmonary exam normal        Cardiovascular Exercise Tolerance: Good hypertension, (-) anginaNormal cardiovascular exam  ECG 11/14\1/22:  Normal sinus rhythm Right bundle branch block   Neuro/Psych PSYCHIATRIC DISORDERS (panic disorder, ADHD) Anxiety Depression TIA (vs. amaurosis fugax 2022)CVA    GI/Hepatic Neg liver ROS, GERD  ,  Endo/Other  negative endocrine ROS  Renal/GU      Musculoskeletal  (+) Arthritis ,   Abdominal   Peds  Hematology negative hematology ROS (+)   Anesthesia Other Findings Past Medical History: No date: ADD (attention deficit disorder with hyperactivity) No date: Allergic rhinitis No date: Anxiety No date: Arthritis No date: Depression No date: GERD (gastroesophageal reflux disease) No date: History of chicken pox No date: Hypertension No date: Pre-diabetes No date: Stroke [facility])     Comment:  2022 mini stroke  Past Surgical History: 09/30/1977: APPENDECTOMY 09/30/1993: BLADDER SUSPENSION     Comment:  during hysterectomy 10/01/1983: CHOLECYSTECTOMY No date: COLONOSCOPY 09/30/1968: TONSILLECTOMY     Comment:  adnoids 09/30/1993: VAGINAL HYSTERECTOMY  BMI    Body Mass Index: 26.43 kg/m      Reproductive/Obstetrics negative OB ROS                            Anesthesia Physical Anesthesia Plan  ASA: 3  Anesthesia Plan: General LMA   Post-op Pain Management:     Induction: Intravenous  PONV Risk Score and Plan: Dexamethasone, Ondansetron, Midazolam and Treatment may vary due to age or medical condition  Airway Management Planned: LMA  Additional Equipment:   Intra-op Plan:   Post-operative Plan: Extubation in OR  Informed Consent: I have reviewed the patients History and Physical, chart, labs and discussed the procedure including the risks, benefits and alternatives for the proposed anesthesia with the patient or authorized representative who has indicated his/her understanding and acceptance.     Dental Advisory Given  Plan Discussed with: Anesthesiologist, CRNA and Surgeon  Anesthesia Plan Comments: (Patient consented for risks of anesthesia including but not limited to:  - adverse reactions to medications - damage to eyes, teeth, lips or other oral mucosa - nerve damage due to positioning  - sore throat or hoarseness - Damage to heart, brain, nerves, lungs, other parts of body or loss of life  Patient voiced understanding.)        Anesthesia Quick Evaluation

## 2021-08-17 NOTE — Anesthesia Postprocedure Evaluation (Signed)
Anesthesia Post Note  Patient: Dana Benson  Procedure(s) Performed: AXILLARY LYMPH NODE BIOPSY (Left: Axilla)  Patient location during evaluation: PACU Anesthesia Type: General Level of consciousness: awake and alert Pain management: pain level controlled Vital Signs Assessment: post-procedure vital signs reviewed and stable Respiratory status: spontaneous breathing, nonlabored ventilation, respiratory function stable and patient connected to nasal cannula oxygen Cardiovascular status: blood pressure returned to baseline and stable Postop Assessment: no apparent nausea or vomiting Anesthetic complications: no   No notable events documented.   Last Vitals:  Vitals:   08/17/21 1014 08/17/21 1028  BP: 102/69 97/67  Pulse: 82 74  Resp: 15 16  Temp: 36.7 C (!) 36.3 C  SpO2: 96% 98%    Last Pain:  Vitals:   08/17/21 1028  TempSrc: Temporal  PainSc: 3                  Cleda Mccreedy Ashyla Luth

## 2021-08-17 NOTE — Anesthesia Procedure Notes (Signed)
Procedure Name: LMA Insertion Date/Time: 08/17/2021 8:56 AM Performed by: Lynden Oxford, CRNA Pre-anesthesia Checklist: Patient identified, Emergency Drugs available, Suction available and Patient being monitored Patient Re-evaluated:Patient Re-evaluated prior to induction Oxygen Delivery Method: Circle system utilized Preoxygenation: Pre-oxygenation with 100% oxygen Induction Type: IV induction Ventilation: Mask ventilation without difficulty LMA: LMA inserted LMA Size: 4.0 Tube type: Oral Number of attempts: 1 Placement Confirmation: positive ETCO2 and breath sounds checked- equal and bilateral Tube secured with: Tape Dental Injury: Teeth and Oropharynx as per pre-operative assessment

## 2021-08-18 ENCOUNTER — Encounter: Payer: Self-pay | Admitting: General Surgery

## 2021-08-22 ENCOUNTER — Encounter: Payer: Self-pay | Admitting: General Surgery

## 2021-08-22 LAB — SURGICAL PATHOLOGY

## 2021-08-27 ENCOUNTER — Other Ambulatory Visit: Payer: Self-pay

## 2021-08-27 MED ORDER — CARESTART COVID-19 HOME TEST VI KIT
PACK | 0 refills | Status: DC
Start: 2021-08-27 — End: 2021-10-24
  Filled 2021-08-27: qty 2, 4d supply, fill #0

## 2021-08-28 ENCOUNTER — Other Ambulatory Visit: Payer: Self-pay

## 2021-08-31 ENCOUNTER — Other Ambulatory Visit: Payer: Self-pay

## 2021-08-31 MED ORDER — PANTOPRAZOLE SODIUM 40 MG PO TBEC
DELAYED_RELEASE_TABLET | ORAL | 1 refills | Status: DC
  Filled 2021-08-31: qty 180, 90d supply, fill #0
  Filled 2021-12-04: qty 180, 90d supply, fill #1

## 2021-09-03 ENCOUNTER — Other Ambulatory Visit: Payer: Self-pay

## 2021-09-04 ENCOUNTER — Other Ambulatory Visit: Payer: Self-pay

## 2021-09-04 MED ORDER — HYDROXYZINE PAMOATE 50 MG PO CAPS
ORAL_CAPSULE | ORAL | 0 refills | Status: AC
  Filled 2021-09-04 – 2022-04-03 (×3): qty 90, 90d supply, fill #0

## 2021-09-04 MED ORDER — ESZOPICLONE 3 MG PO TABS
ORAL_TABLET | ORAL | 0 refills | Status: DC
  Filled 2021-09-04: qty 90, 90d supply, fill #0

## 2021-09-04 MED ORDER — BUPROPION HCL ER (SR) 200 MG PO TB12
ORAL_TABLET | ORAL | 0 refills | Status: AC
  Filled 2021-09-04: qty 90, 90d supply, fill #0

## 2021-09-04 MED ORDER — AMPHETAMINE-DEXTROAMPHETAMINE 20 MG PO TABS
ORAL_TABLET | ORAL | 0 refills | Status: DC
  Filled 2021-09-04: qty 180, 90d supply, fill #0

## 2021-09-04 MED ORDER — SERTRALINE HCL 100 MG PO TABS
ORAL_TABLET | ORAL | 0 refills | Status: DC
  Filled 2021-09-04: qty 180, 90d supply, fill #0

## 2021-09-05 ENCOUNTER — Other Ambulatory Visit: Payer: Self-pay

## 2021-09-06 ENCOUNTER — Other Ambulatory Visit: Payer: Self-pay

## 2021-09-07 ENCOUNTER — Other Ambulatory Visit: Payer: Self-pay

## 2021-09-07 MED ORDER — FLUTICASONE PROPIONATE 50 MCG/ACT NA SUSP
2.0000 | Freq: Every day | NASAL | 2 refills | Status: DC
  Filled 2021-09-07: qty 16, 30d supply, fill #0
  Filled 2021-11-19: qty 16, 30d supply, fill #1
  Filled 2021-12-31: qty 16, 30d supply, fill #2

## 2021-09-26 ENCOUNTER — Other Ambulatory Visit: Payer: Self-pay

## 2021-09-26 MED ORDER — ESZOPICLONE 3 MG PO TABS
ORAL_TABLET | ORAL | 0 refills | Status: DC
  Filled 2021-12-04: qty 30, 30d supply, fill #0

## 2021-09-26 MED ORDER — HYDROXYZINE PAMOATE 50 MG PO CAPS
ORAL_CAPSULE | ORAL | 0 refills | Status: DC
  Filled 2021-09-26: qty 90, 90d supply, fill #0

## 2021-09-26 MED ORDER — BUPROPION HCL ER (SR) 200 MG PO TB12
ORAL_TABLET | ORAL | 0 refills | Status: DC
Start: 2021-09-26 — End: 2023-03-26
  Filled 2021-09-26 – 2021-11-23 (×2): qty 90, 90d supply, fill #0

## 2021-09-26 MED ORDER — SERTRALINE HCL 100 MG PO TABS
ORAL_TABLET | ORAL | 0 refills | Status: DC
  Filled 2021-09-26 – 2021-12-04 (×2): qty 180, 90d supply, fill #0

## 2021-09-26 MED ORDER — AMPHETAMINE-DEXTROAMPHETAMINE 20 MG PO TABS: ORAL_TABLET | ORAL | 0 refills | Status: DC

## 2021-09-27 ENCOUNTER — Other Ambulatory Visit: Payer: Self-pay

## 2021-09-27 MED ORDER — MOUNJARO 2.5 MG/0.5ML ~~LOC~~ SOAJ
SUBCUTANEOUS | 3 refills | Status: AC
  Filled 2021-09-27: qty 2, 28d supply, fill #0
  Filled 2022-03-27: qty 2, 28d supply, fill #1

## 2021-10-03 ENCOUNTER — Other Ambulatory Visit: Payer: Self-pay

## 2021-10-22 ENCOUNTER — Other Ambulatory Visit: Payer: Self-pay

## 2021-10-22 MED ORDER — ROSUVASTATIN CALCIUM 5 MG PO TABS
ORAL_TABLET | ORAL | 1 refills | Status: DC
  Filled 2021-10-22: qty 90, 90d supply, fill #0
  Filled 2022-02-05: qty 90, 90d supply, fill #1

## 2021-10-22 MED ORDER — MOUNJARO 5 MG/0.5ML ~~LOC~~ SOAJ
SUBCUTANEOUS | 2 refills | Status: DC
  Filled 2021-10-22: qty 2, 28d supply, fill #0
  Filled 2021-11-19: qty 2, 28d supply, fill #1
  Filled 2021-12-17: qty 2, 28d supply, fill #2

## 2021-10-23 ENCOUNTER — Other Ambulatory Visit: Payer: Self-pay

## 2021-10-24 ENCOUNTER — Other Ambulatory Visit: Payer: Self-pay

## 2021-10-24 MED ORDER — CARESTART COVID-19 HOME TEST VI KIT
PACK | 0 refills | Status: DC
  Filled 2021-10-24: qty 2, 4d supply, fill #0

## 2021-10-29 ENCOUNTER — Other Ambulatory Visit: Payer: Self-pay

## 2021-11-15 ENCOUNTER — Ambulatory Visit: Payer: No Typology Code available for payment source | Admitting: Dermatology

## 2021-11-19 ENCOUNTER — Other Ambulatory Visit: Payer: Self-pay

## 2021-11-22 ENCOUNTER — Other Ambulatory Visit: Payer: Self-pay

## 2021-11-23 ENCOUNTER — Other Ambulatory Visit: Payer: Self-pay

## 2021-12-04 ENCOUNTER — Other Ambulatory Visit: Payer: Self-pay

## 2021-12-06 ENCOUNTER — Other Ambulatory Visit: Payer: Self-pay

## 2021-12-06 MED ORDER — LOSARTAN POTASSIUM 100 MG PO TABS
100.0000 mg | ORAL_TABLET | Freq: Every day | ORAL | 1 refills | Status: AC
  Filled 2021-12-06: qty 90, 90d supply, fill #0
  Filled 2022-03-13: qty 90, 90d supply, fill #1

## 2021-12-06 MED ORDER — HYDROCHLOROTHIAZIDE 25 MG PO TABS
25.0000 mg | ORAL_TABLET | Freq: Every day | ORAL | 1 refills | Status: AC
  Filled 2021-12-06: qty 90, 90d supply, fill #0
  Filled 2022-03-13: qty 90, 90d supply, fill #1

## 2021-12-06 MED ORDER — PANTOPRAZOLE SODIUM 40 MG PO TBEC
DELAYED_RELEASE_TABLET | ORAL | 0 refills | Status: DC
  Filled 2021-12-06 – 2022-03-13 (×2): qty 180, 90d supply, fill #0

## 2021-12-07 ENCOUNTER — Other Ambulatory Visit: Payer: Self-pay

## 2021-12-12 ENCOUNTER — Other Ambulatory Visit: Payer: Self-pay

## 2021-12-14 ENCOUNTER — Other Ambulatory Visit: Payer: Self-pay

## 2021-12-17 ENCOUNTER — Other Ambulatory Visit: Payer: Self-pay

## 2021-12-19 ENCOUNTER — Other Ambulatory Visit: Payer: Self-pay

## 2021-12-19 MED ORDER — ESZOPICLONE 3 MG PO TABS
ORAL_TABLET | ORAL | 0 refills | Status: DC
  Filled 2021-12-31 (×3): qty 90, fill #0
  Filled 2022-01-03: qty 90, 90d supply, fill #0

## 2021-12-19 MED ORDER — AMPHETAMINE-DEXTROAMPHETAMINE 20 MG PO TABS
ORAL_TABLET | ORAL | 0 refills | Status: DC
  Filled 2021-12-19: qty 180, 90d supply, fill #0

## 2021-12-19 MED ORDER — BUPROPION HCL ER (SR) 200 MG PO TB12
ORAL_TABLET | ORAL | 0 refills | Status: DC
  Filled 2021-12-19 – 2022-02-18 (×2): qty 90, 90d supply, fill #0

## 2021-12-19 MED ORDER — SERTRALINE HCL 100 MG PO TABS
ORAL_TABLET | ORAL | 0 refills | Status: DC
  Filled 2021-12-19 – 2022-03-13 (×2): qty 180, 90d supply, fill #0

## 2021-12-19 MED ORDER — HYDROXYZINE PAMOATE 50 MG PO CAPS
ORAL_CAPSULE | ORAL | 0 refills | Status: DC
  Filled 2021-12-31: qty 90, 90d supply, fill #0

## 2021-12-24 ENCOUNTER — Other Ambulatory Visit: Payer: Self-pay

## 2021-12-24 MED ORDER — MOUNJARO 7.5 MG/0.5ML ~~LOC~~ SOAJ
SUBCUTANEOUS | 2 refills | Status: DC
  Filled 2021-12-24: qty 2, 28d supply, fill #0
  Filled 2022-02-05: qty 2, 28d supply, fill #1
  Filled 2022-03-13: qty 2, 28d supply, fill #2

## 2021-12-31 ENCOUNTER — Encounter: Payer: Self-pay | Admitting: Pharmacist

## 2021-12-31 ENCOUNTER — Other Ambulatory Visit: Payer: Self-pay

## 2022-01-03 ENCOUNTER — Other Ambulatory Visit: Payer: Self-pay

## 2022-02-05 ENCOUNTER — Other Ambulatory Visit: Payer: Self-pay

## 2022-02-18 ENCOUNTER — Other Ambulatory Visit: Payer: Self-pay

## 2022-02-19 ENCOUNTER — Other Ambulatory Visit: Payer: Self-pay

## 2022-02-20 ENCOUNTER — Other Ambulatory Visit: Payer: Self-pay

## 2022-02-20 MED ORDER — FLUTICASONE PROPIONATE 50 MCG/ACT NA SUSP
2.0000 | Freq: Every day | NASAL | 2 refills | Status: DC
  Filled 2022-02-20: qty 16, 30d supply, fill #0
  Filled 2022-03-27: qty 16, 30d supply, fill #1
  Filled 2022-05-07: qty 16, 30d supply, fill #2

## 2022-03-09 IMAGING — MG DIGITAL DIAGNOSTIC BILAT W/ TOMO W/ CAD
6 of 10 series · 6 of 30 positions shown · non-contrast
Comparison: Previous exam(s).

CLINICAL DATA: Fullness in the left axilla.

EXAM:
DIGITAL DIAGNOSTIC BILATERAL MAMMOGRAM WITH TOMOSYNTHESIS AND CAD;
ULTRASOUND LEFT BREAST LIMITED
TECHNIQUE: Bilateral digital diagnostic mammography and breast tomosynthesis
was performed. The images were evaluated with computer-aided
detection.; Targeted ultrasound examination of the left breast was
performed.

[R CC synth-2D]
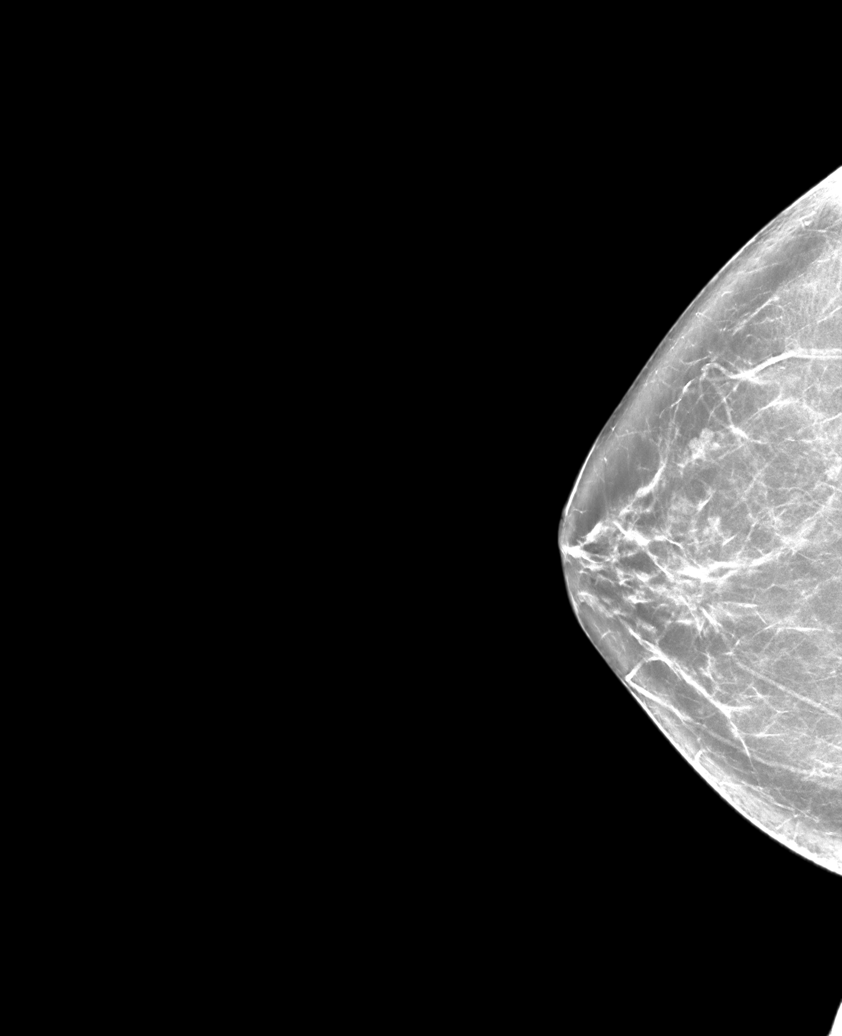

[L CC synth-2D]
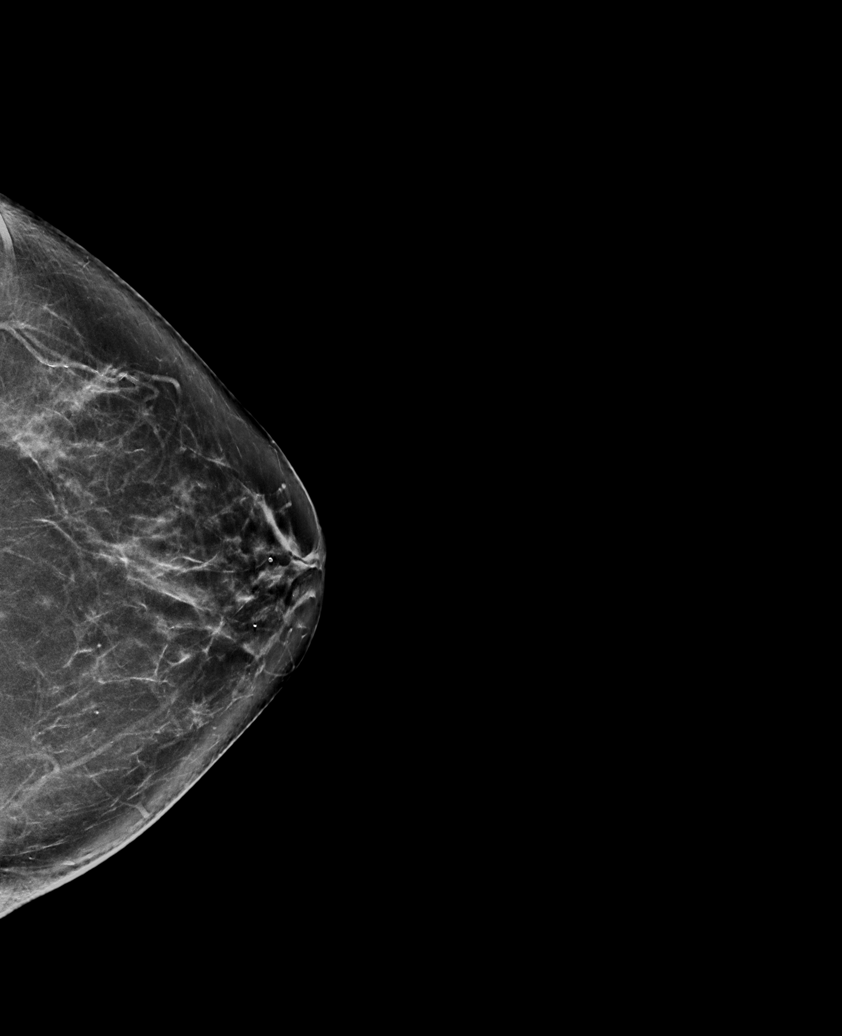

[R MLO synth-2D]
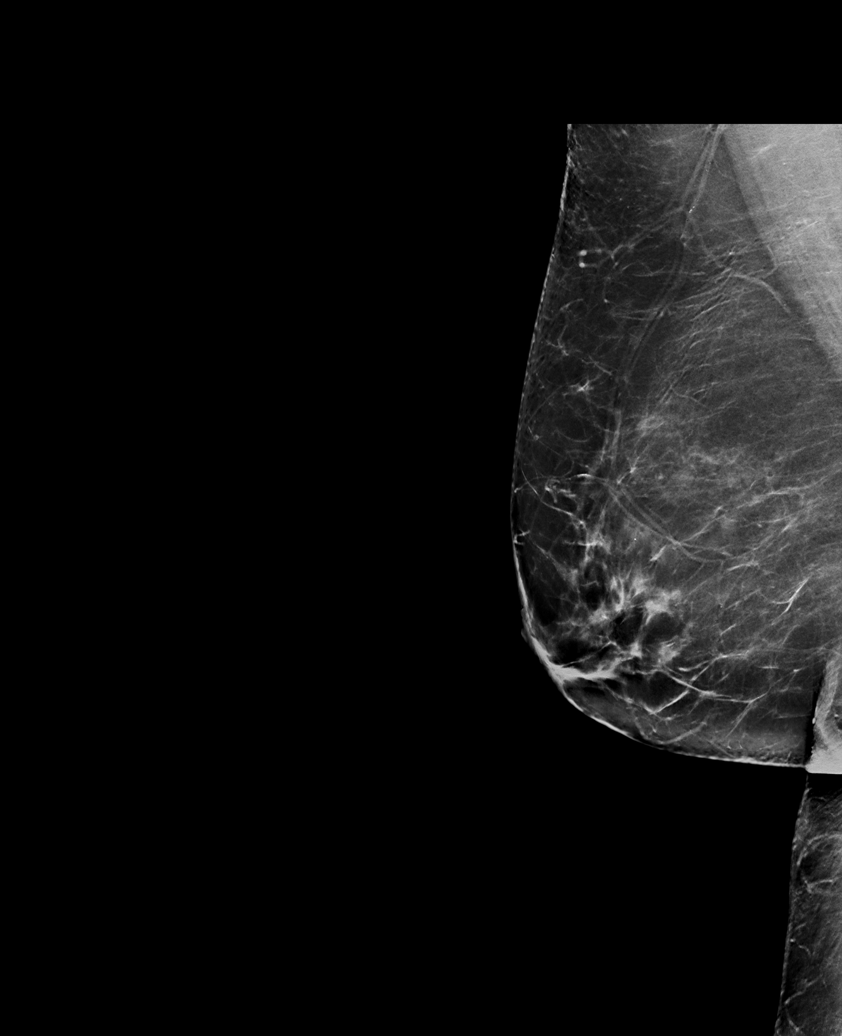

[L MLO synth-2D (1 of 2)]
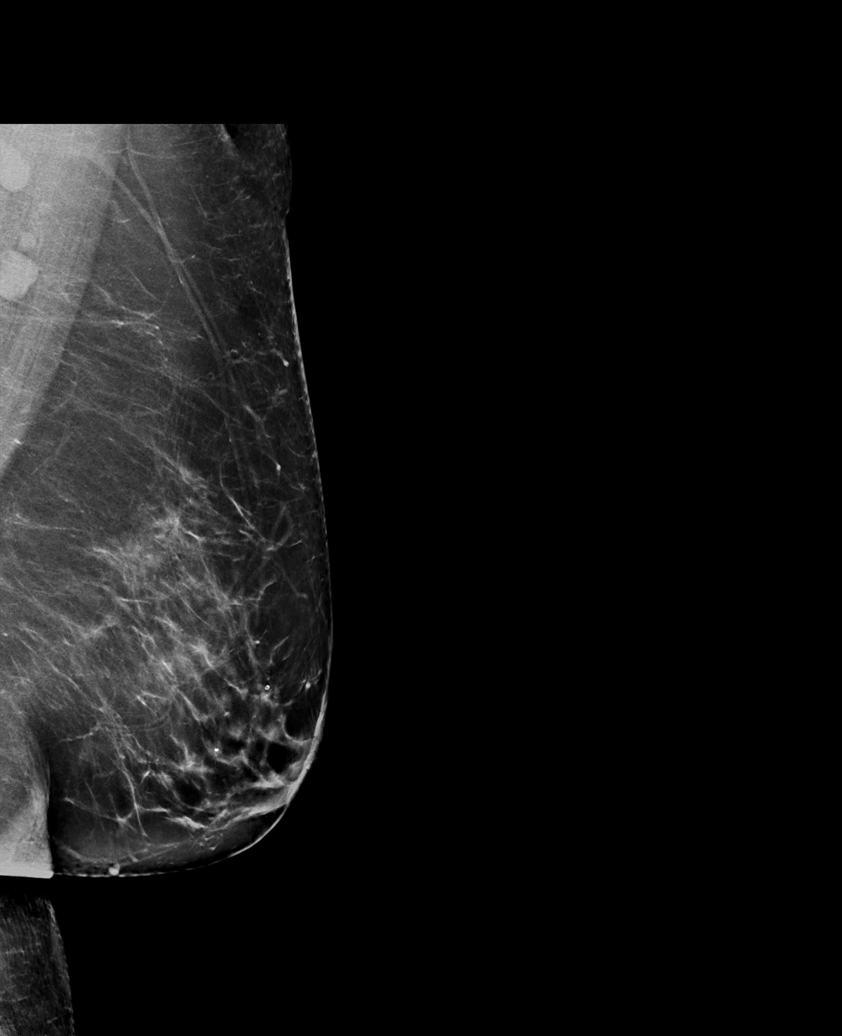

[L MLO synth-2D (2 of 2)]
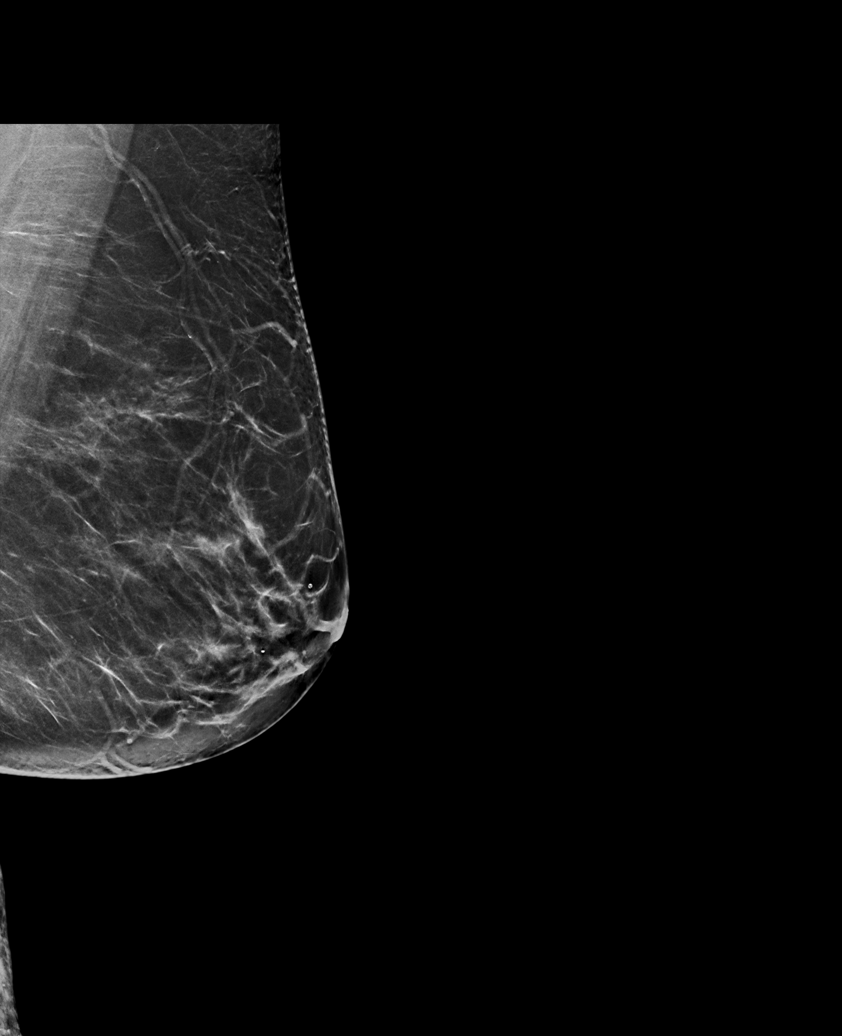

[L MLO tomo · tomo slice 39/77.0]
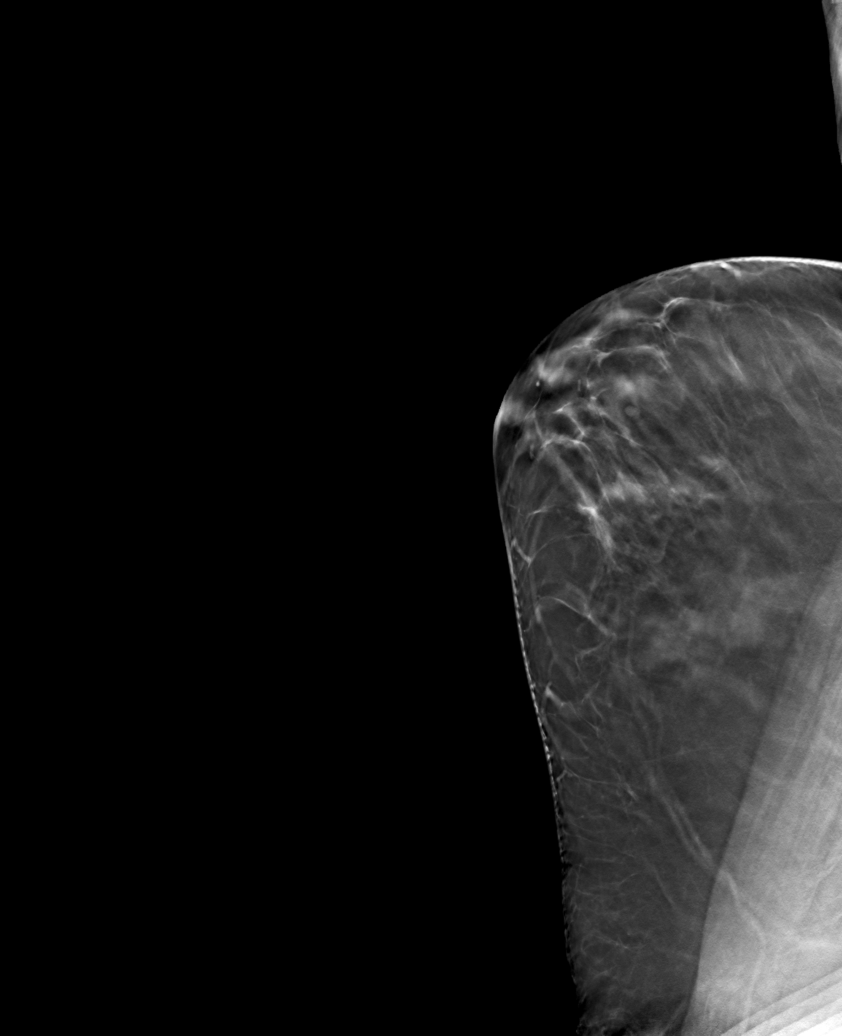

[6 of 30 positions shown; findings below may reference images not displayed]

ACR Breast Density Category b: There are scattered areas of
fibroglandular density.
FINDINGS: No suspicious masses, calcifications, or distortion identified in
either breast. Mildly prominent left axillary nodes are seen on 1 of
the 2 MLO views.

Targeted ultrasound is performed, showing mildly abnormal nodes in
the left axilla. A representative node demonstrates a cortex
measuring up to 4.9 mm. There is a borderline node in the right
axilla. All nodes maintain their fatty hila.
IMPRESSION: Mildly abnormal nodes in the left axilla with a borderline node on
the right.

RECOMMENDATION:
Recommend ultrasound-guided biopsy of the most abnormal node on the
left with a cortex measuring 4.9 mm.

I have discussed the findings and recommendations with the patient.
If applicable, a reminder letter will be sent to the patient
regarding the next appointment.

BI-RADS CATEGORY  4: Suspicious.

## 2022-03-13 ENCOUNTER — Other Ambulatory Visit: Payer: Self-pay

## 2022-03-13 MED ORDER — ESZOPICLONE 3 MG PO TABS
ORAL_TABLET | ORAL | 0 refills | Status: DC
  Filled 2022-03-13 – 2022-04-03 (×2): qty 90, 90d supply, fill #0

## 2022-03-13 MED ORDER — HYDROXYZINE PAMOATE 50 MG PO CAPS
ORAL_CAPSULE | ORAL | 0 refills | Status: DC
  Filled 2022-03-13: qty 90, 90d supply, fill #0

## 2022-03-13 MED ORDER — BUPROPION HCL ER (SR) 200 MG PO TB12
ORAL_TABLET | ORAL | 0 refills | Status: DC
  Filled 2022-03-13 – 2022-09-11 (×2): qty 90, 90d supply, fill #0

## 2022-03-13 MED ORDER — SERTRALINE HCL 100 MG PO TABS
ORAL_TABLET | ORAL | 0 refills | Status: DC
  Filled 2022-03-13 – 2022-09-11 (×2): qty 180, 90d supply, fill #0

## 2022-03-13 MED ORDER — AMPHETAMINE-DEXTROAMPHETAMINE 20 MG PO TABS
ORAL_TABLET | ORAL | 0 refills | Status: DC
  Filled 2022-03-13: qty 180, 90d supply, fill #0

## 2022-03-27 ENCOUNTER — Other Ambulatory Visit: Payer: Self-pay

## 2022-03-28 ENCOUNTER — Other Ambulatory Visit: Payer: Self-pay

## 2022-03-29 ENCOUNTER — Other Ambulatory Visit: Payer: Self-pay

## 2022-03-29 MED ORDER — MOUNJARO 7.5 MG/0.5ML ~~LOC~~ SOAJ
SUBCUTANEOUS | 2 refills | Status: DC
  Filled 2022-03-29: qty 2, 28d supply, fill #0
  Filled 2022-05-07: qty 2, 28d supply, fill #1
  Filled 2022-06-19: qty 2, 28d supply, fill #2

## 2022-04-03 ENCOUNTER — Other Ambulatory Visit: Payer: Self-pay

## 2022-04-04 ENCOUNTER — Other Ambulatory Visit: Payer: Self-pay

## 2022-04-25 ENCOUNTER — Other Ambulatory Visit: Payer: Self-pay

## 2022-05-07 ENCOUNTER — Other Ambulatory Visit: Payer: Self-pay

## 2022-05-07 MED ORDER — ROSUVASTATIN CALCIUM 5 MG PO TABS
ORAL_TABLET | ORAL | 1 refills | Status: DC
  Filled 2022-05-07: qty 90, 90d supply, fill #0
  Filled 2022-09-11 – 2022-10-02 (×2): qty 90, 90d supply, fill #1

## 2022-05-27 ENCOUNTER — Other Ambulatory Visit: Payer: Self-pay

## 2022-05-30 ENCOUNTER — Other Ambulatory Visit: Payer: Self-pay

## 2022-05-30 MED ORDER — BUPROPION HCL ER (SR) 200 MG PO TB12
200.0000 mg | ORAL_TABLET | Freq: Every day | ORAL | 0 refills | Status: DC
  Filled 2022-05-30: qty 90, 90d supply, fill #0

## 2022-05-31 ENCOUNTER — Other Ambulatory Visit: Payer: Self-pay

## 2022-06-12 ENCOUNTER — Other Ambulatory Visit: Payer: Self-pay

## 2022-06-12 MED ORDER — ESZOPICLONE 3 MG PO TABS
ORAL_TABLET | ORAL | 0 refills | Status: DC
  Filled 2022-07-04: qty 90, 90d supply, fill #0

## 2022-06-12 MED ORDER — BUPROPION HCL ER (SR) 200 MG PO TB12
ORAL_TABLET | ORAL | 0 refills | Status: DC
  Filled 2022-06-12 – 2023-02-28 (×2): qty 90, 90d supply, fill #0

## 2022-06-12 MED ORDER — HYDROXYZINE PAMOATE 50 MG PO CAPS
ORAL_CAPSULE | ORAL | 0 refills | Status: DC
  Filled 2022-06-12: qty 90, 90d supply, fill #0

## 2022-06-12 MED ORDER — SERTRALINE HCL 100 MG PO TABS
ORAL_TABLET | ORAL | 0 refills | Status: DC
  Filled 2022-06-12: qty 180, 90d supply, fill #0

## 2022-06-12 MED ORDER — AMPHETAMINE-DEXTROAMPHETAMINE 20 MG PO TABS
ORAL_TABLET | ORAL | 0 refills | Status: DC
Start: 2022-06-12 — End: 2023-03-26
  Filled 2022-06-12: qty 180, 90d supply, fill #0

## 2022-06-12 MED ORDER — VRAYLAR 1.5 MG PO CAPS
ORAL_CAPSULE | ORAL | 0 refills | Status: DC
  Filled 2022-06-12: qty 30, 30d supply, fill #0

## 2022-06-14 ENCOUNTER — Other Ambulatory Visit: Payer: Self-pay

## 2022-06-17 ENCOUNTER — Other Ambulatory Visit: Payer: Self-pay

## 2022-06-18 ENCOUNTER — Other Ambulatory Visit: Payer: Self-pay

## 2022-06-18 MED ORDER — LOSARTAN POTASSIUM 100 MG PO TABS
100.0000 mg | ORAL_TABLET | Freq: Every day | ORAL | 1 refills | Status: DC
  Filled 2022-06-18: qty 90, 90d supply, fill #0
  Filled 2022-09-11 – 2022-10-02 (×2): qty 90, 90d supply, fill #1

## 2022-06-19 ENCOUNTER — Other Ambulatory Visit: Payer: Self-pay

## 2022-06-26 ENCOUNTER — Other Ambulatory Visit: Payer: Self-pay

## 2022-06-26 MED ORDER — PANTOPRAZOLE SODIUM 40 MG PO TBEC
DELAYED_RELEASE_TABLET | ORAL | 1 refills | Status: DC
  Filled 2022-06-26: qty 180, 90d supply, fill #0
  Filled 2022-10-02: qty 180, 90d supply, fill #1

## 2022-06-26 MED ORDER — HYDROCHLOROTHIAZIDE 25 MG PO TABS
25.0000 mg | ORAL_TABLET | Freq: Every day | ORAL | 1 refills | Status: AC
  Filled 2022-06-26: qty 90, 90d supply, fill #0
  Filled 2022-10-02: qty 90, 90d supply, fill #1

## 2022-06-28 ENCOUNTER — Other Ambulatory Visit: Payer: Self-pay | Admitting: Physician Assistant

## 2022-06-28 DIAGNOSIS — Z1231 Encounter for screening mammogram for malignant neoplasm of breast: Secondary | ICD-10-CM

## 2022-07-04 ENCOUNTER — Other Ambulatory Visit: Payer: Self-pay

## 2022-07-09 ENCOUNTER — Other Ambulatory Visit: Payer: Self-pay | Admitting: Physician Assistant

## 2022-07-09 DIAGNOSIS — R599 Enlarged lymph nodes, unspecified: Secondary | ICD-10-CM

## 2022-07-09 DIAGNOSIS — Z1231 Encounter for screening mammogram for malignant neoplasm of breast: Secondary | ICD-10-CM

## 2022-07-26 ENCOUNTER — Other Ambulatory Visit: Payer: Self-pay | Admitting: Physician Assistant

## 2022-07-26 ENCOUNTER — Ambulatory Visit
Admission: RE | Admit: 2022-07-26 | Discharge: 2022-07-26 | Disposition: A | Discharge status: Home / Self Care | Payer: No Typology Code available for payment source | Source: Ambulatory Visit | Attending: Physician Assistant | Admitting: Physician Assistant

## 2022-07-26 DIAGNOSIS — R599 Enlarged lymph nodes, unspecified: Secondary | ICD-10-CM

## 2022-07-26 DIAGNOSIS — Z1231 Encounter for screening mammogram for malignant neoplasm of breast: Secondary | ICD-10-CM

## 2022-08-14 ENCOUNTER — Other Ambulatory Visit: Payer: Self-pay

## 2022-08-14 MED ORDER — ESZOPICLONE 3 MG PO TABS
3.0000 mg | ORAL_TABLET | Freq: Every evening | ORAL | 0 refills | Status: DC | PRN
  Filled 2022-08-14 – 2022-10-02 (×3): qty 90, 90d supply, fill #0

## 2022-08-14 MED ORDER — AMPHETAMINE-DEXTROAMPHETAMINE 20 MG PO TABS
20.0000 mg | ORAL_TABLET | Freq: Two times a day (BID) | ORAL | 0 refills | Status: DC
  Filled 2022-08-14: qty 180, 90d supply, fill #0

## 2022-08-14 MED ORDER — SERTRALINE HCL 100 MG PO TABS
200.0000 mg | ORAL_TABLET | Freq: Every day | ORAL | 0 refills | Status: DC
  Filled 2022-08-14 – 2022-10-02 (×3): qty 180, 90d supply, fill #0

## 2022-08-14 MED ORDER — BUPROPION HCL ER (SR) 200 MG PO TB12
200.0000 mg | ORAL_TABLET | Freq: Every day | ORAL | 0 refills | Status: DC
  Filled 2022-08-14: qty 90, 90d supply, fill #0

## 2022-08-14 MED ORDER — VRAYLAR 1.5 MG PO CAPS
1.5000 mg | ORAL_CAPSULE | Freq: Every day | ORAL | 0 refills | Status: DC
  Filled 2022-08-14: qty 30, 30d supply, fill #0

## 2022-08-14 MED ORDER — HYDROXYZINE PAMOATE 50 MG PO CAPS
50.0000 mg | ORAL_CAPSULE | Freq: Every evening | ORAL | 0 refills | Status: DC | PRN
  Filled 2022-08-14 – 2022-10-02 (×2): qty 90, 90d supply, fill #0

## 2022-08-21 ENCOUNTER — Other Ambulatory Visit: Payer: Self-pay

## 2022-08-21 MED ORDER — MOUNJARO 7.5 MG/0.5ML ~~LOC~~ SOAJ
SUBCUTANEOUS | 2 refills | Status: DC
  Filled 2022-08-21: qty 2, 28d supply, fill #0
  Filled 2022-10-02 (×2): qty 2, 28d supply, fill #1
  Filled 2022-11-19: qty 2, 28d supply, fill #2

## 2022-08-28 ENCOUNTER — Other Ambulatory Visit: Payer: Self-pay

## 2022-09-11 ENCOUNTER — Other Ambulatory Visit: Payer: Self-pay

## 2022-09-11 MED ORDER — FLUTICASONE PROPIONATE 50 MCG/ACT NA SUSP
2.0000 | Freq: Every day | NASAL | 2 refills | Status: DC
  Filled 2022-09-11 – 2022-10-02 (×2): qty 16, 30d supply, fill #0
  Filled 2022-11-19: qty 16, 30d supply, fill #1
  Filled 2022-12-31: qty 16, 30d supply, fill #2

## 2022-09-12 ENCOUNTER — Other Ambulatory Visit: Payer: Self-pay

## 2022-09-12 MED ORDER — VRAYLAR 1.5 MG PO CAPS
1.5000 mg | ORAL_CAPSULE | Freq: Every day | ORAL | 0 refills | Status: DC
  Filled 2022-09-12: qty 30, 30d supply, fill #0

## 2022-09-13 ENCOUNTER — Other Ambulatory Visit: Payer: Self-pay

## 2022-09-19 ENCOUNTER — Other Ambulatory Visit: Payer: Self-pay

## 2022-09-20 ENCOUNTER — Other Ambulatory Visit: Payer: Self-pay

## 2022-10-02 ENCOUNTER — Other Ambulatory Visit: Payer: Self-pay

## 2022-10-04 ENCOUNTER — Other Ambulatory Visit: Payer: Self-pay

## 2022-10-07 ENCOUNTER — Other Ambulatory Visit: Payer: Self-pay

## 2022-10-07 DIAGNOSIS — F331 Major depressive disorder, recurrent, moderate: Secondary | ICD-10-CM | POA: Diagnosis not present

## 2022-10-07 DIAGNOSIS — F5105 Insomnia due to other mental disorder: Secondary | ICD-10-CM | POA: Diagnosis not present

## 2022-10-07 DIAGNOSIS — F411 Generalized anxiety disorder: Secondary | ICD-10-CM | POA: Diagnosis not present

## 2022-10-07 MED ORDER — BUPROPION HCL ER (SR) 200 MG PO TB12
200.0000 mg | ORAL_TABLET | Freq: Every day | ORAL | 0 refills | Status: DC
  Filled 2022-11-26: qty 90, 90d supply, fill #0

## 2022-10-07 MED ORDER — ESZOPICLONE 3 MG PO TABS
3.0000 mg | ORAL_TABLET | Freq: Every evening | ORAL | 0 refills | Status: DC | PRN
  Filled 2022-12-31: qty 90, 90d supply, fill #0

## 2022-10-07 MED ORDER — AMPHETAMINE-DEXTROAMPHETAMINE 20 MG PO TABS: ORAL_TABLET | ORAL | 0 refills | Status: DC

## 2022-10-07 MED ORDER — HYDROXYZINE PAMOATE 50 MG PO CAPS
50.0000 mg | ORAL_CAPSULE | Freq: Every evening | ORAL | 0 refills | Status: DC | PRN
  Filled 2022-12-31: qty 90, 90d supply, fill #0

## 2022-10-07 MED ORDER — SERTRALINE HCL 100 MG PO TABS
200.0000 mg | ORAL_TABLET | Freq: Every day | ORAL | 0 refills | Status: DC
  Filled 2022-12-31: qty 180, 90d supply, fill #0

## 2022-11-19 ENCOUNTER — Other Ambulatory Visit: Payer: Self-pay

## 2022-11-19 MED ORDER — AMPHETAMINE-DEXTROAMPHETAMINE 20 MG PO TABS
20.0000 mg | ORAL_TABLET | Freq: Two times a day (BID) | ORAL | 0 refills | Status: AC
  Filled 2022-11-19: qty 180, 90d supply, fill #0

## 2022-11-22 ENCOUNTER — Other Ambulatory Visit: Payer: Self-pay

## 2022-11-26 ENCOUNTER — Other Ambulatory Visit: Payer: Self-pay

## 2022-12-02 DIAGNOSIS — R051 Acute cough: Secondary | ICD-10-CM | POA: Diagnosis not present

## 2022-12-04 ENCOUNTER — Other Ambulatory Visit: Payer: Self-pay

## 2022-12-04 MED ORDER — HYDROCOD POLI-CHLORPHE POLI ER 10-8 MG/5ML PO SUER
5.0000 mL | Freq: Two times a day (BID) | ORAL | 0 refills | Status: DC | PRN
  Filled 2022-12-04: qty 150, 15d supply, fill #0

## 2022-12-04 MED ORDER — AZITHROMYCIN 250 MG PO TABS
ORAL_TABLET | ORAL | 0 refills | Status: DC
  Filled 2022-12-04: qty 6, 5d supply, fill #0

## 2022-12-16 ENCOUNTER — Other Ambulatory Visit: Payer: Self-pay

## 2022-12-17 ENCOUNTER — Other Ambulatory Visit: Payer: Self-pay

## 2022-12-18 ENCOUNTER — Other Ambulatory Visit: Payer: Self-pay

## 2022-12-18 DIAGNOSIS — I10 Essential (primary) hypertension: Secondary | ICD-10-CM | POA: Diagnosis not present

## 2022-12-18 DIAGNOSIS — E119 Type 2 diabetes mellitus without complications: Secondary | ICD-10-CM | POA: Diagnosis not present

## 2022-12-18 MED ORDER — MOUNJARO 7.5 MG/0.5ML ~~LOC~~ SOAJ
7.5000 mg | SUBCUTANEOUS | 2 refills | Status: DC
  Filled 2022-12-18 – 2022-12-27 (×4): qty 2, 28d supply, fill #0
  Filled 2023-03-19: qty 2, 28d supply, fill #1
  Filled 2023-06-26: qty 2, 28d supply, fill #2

## 2022-12-19 ENCOUNTER — Other Ambulatory Visit: Payer: Self-pay

## 2022-12-25 DIAGNOSIS — F418 Other specified anxiety disorders: Secondary | ICD-10-CM | POA: Diagnosis not present

## 2022-12-25 DIAGNOSIS — F988 Other specified behavioral and emotional disorders with onset usually occurring in childhood and adolescence: Secondary | ICD-10-CM | POA: Diagnosis not present

## 2022-12-25 DIAGNOSIS — E119 Type 2 diabetes mellitus without complications: Secondary | ICD-10-CM | POA: Diagnosis not present

## 2022-12-25 DIAGNOSIS — I10 Essential (primary) hypertension: Secondary | ICD-10-CM | POA: Diagnosis not present

## 2022-12-27 ENCOUNTER — Other Ambulatory Visit: Payer: Self-pay

## 2022-12-31 ENCOUNTER — Other Ambulatory Visit: Payer: Self-pay

## 2022-12-31 MED ORDER — HYDROXYZINE PAMOATE 50 MG PO CAPS
50.0000 mg | ORAL_CAPSULE | Freq: Every evening | ORAL | 0 refills | Status: DC | PRN
  Filled 2022-12-31: qty 90, 90d supply, fill #0

## 2022-12-31 MED ORDER — BUPROPION HCL ER (SR) 200 MG PO TB12: 200.0000 mg | ORAL_TABLET | Freq: Every day | ORAL | 0 refills | Status: DC

## 2022-12-31 MED ORDER — ESZOPICLONE 3 MG PO TABS
3.0000 mg | ORAL_TABLET | Freq: Every evening | ORAL | 0 refills | Status: DC | PRN
  Filled 2022-12-31 (×2): qty 90, 90d supply, fill #0

## 2022-12-31 MED ORDER — SERTRALINE HCL 100 MG PO TABS
200.0000 mg | ORAL_TABLET | Freq: Every day | ORAL | 0 refills | Status: DC
  Filled 2022-12-31: qty 180, 90d supply, fill #0

## 2023-01-01 ENCOUNTER — Other Ambulatory Visit: Payer: Self-pay

## 2023-01-01 MED ORDER — LOSARTAN POTASSIUM 100 MG PO TABS
100.0000 mg | ORAL_TABLET | Freq: Every day | ORAL | 1 refills | Status: DC
  Filled 2023-01-01: qty 90, 90d supply, fill #0
  Filled 2023-04-09: qty 90, 90d supply, fill #1

## 2023-01-01 MED ORDER — PANTOPRAZOLE SODIUM 40 MG PO TBEC
40.0000 mg | DELAYED_RELEASE_TABLET | Freq: Two times a day (BID) | ORAL | 1 refills | Status: DC
  Filled 2023-01-01: qty 180, 90d supply, fill #0
  Filled 2023-09-15: qty 180, 90d supply, fill #1

## 2023-01-01 MED ORDER — ROSUVASTATIN CALCIUM 5 MG PO TABS
5.0000 mg | ORAL_TABLET | Freq: Every day | ORAL | 1 refills | Status: AC
  Filled 2023-01-01: qty 90, 90d supply, fill #0
  Filled 2023-04-09: qty 90, 90d supply, fill #1

## 2023-01-03 ENCOUNTER — Other Ambulatory Visit: Payer: Self-pay

## 2023-01-03 MED ORDER — FLUTICASONE PROPIONATE 50 MCG/ACT NA SUSP
2.0000 | Freq: Every day | NASAL | 2 refills | Status: DC
  Filled 2023-01-03 – 2023-04-09 (×2): qty 16, 30d supply, fill #0
  Filled 2023-06-26: qty 16, 30d supply, fill #1
  Filled 2023-09-15: qty 16, 30d supply, fill #2

## 2023-01-07 ENCOUNTER — Other Ambulatory Visit: Payer: Self-pay

## 2023-01-07 DIAGNOSIS — F411 Generalized anxiety disorder: Secondary | ICD-10-CM | POA: Diagnosis not present

## 2023-01-07 DIAGNOSIS — Z79899 Other long term (current) drug therapy: Secondary | ICD-10-CM | POA: Diagnosis not present

## 2023-01-07 DIAGNOSIS — F5105 Insomnia due to other mental disorder: Secondary | ICD-10-CM | POA: Diagnosis not present

## 2023-01-07 DIAGNOSIS — F331 Major depressive disorder, recurrent, moderate: Secondary | ICD-10-CM | POA: Diagnosis not present

## 2023-01-07 MED ORDER — BUPROPION HCL ER (SR) 200 MG PO TB12
200.0000 mg | ORAL_TABLET | Freq: Every day | ORAL | 0 refills | Status: DC
  Filled 2023-01-07: qty 90, 90d supply, fill #0

## 2023-01-07 MED ORDER — SERTRALINE HCL 100 MG PO TABS
200.0000 mg | ORAL_TABLET | Freq: Every day | ORAL | 0 refills | Status: DC
  Filled 2023-01-07: qty 180, 90d supply, fill #0

## 2023-01-07 MED ORDER — AMPHETAMINE-DEXTROAMPHETAMINE 20 MG PO TABS: 20.0000 mg | ORAL_TABLET | Freq: Two times a day (BID) | ORAL | 0 refills | Status: DC

## 2023-01-07 MED ORDER — ESZOPICLONE 3 MG PO TABS: 3.0000 mg | ORAL_TABLET | Freq: Every evening | ORAL | 0 refills | Status: DC | PRN

## 2023-01-07 MED ORDER — HYDROXYZINE PAMOATE 50 MG PO CAPS
50.0000 mg | ORAL_CAPSULE | Freq: Every evening | ORAL | 0 refills | Status: DC | PRN
  Filled 2023-01-07: qty 90, 90d supply, fill #0

## 2023-01-23 DIAGNOSIS — E871 Hypo-osmolality and hyponatremia: Secondary | ICD-10-CM | POA: Diagnosis not present

## 2023-01-23 DIAGNOSIS — E876 Hypokalemia: Secondary | ICD-10-CM | POA: Diagnosis not present

## 2023-01-23 DIAGNOSIS — R1033 Periumbilical pain: Secondary | ICD-10-CM | POA: Diagnosis not present

## 2023-01-23 DIAGNOSIS — Z1211 Encounter for screening for malignant neoplasm of colon: Secondary | ICD-10-CM | POA: Diagnosis not present

## 2023-01-23 DIAGNOSIS — R197 Diarrhea, unspecified: Secondary | ICD-10-CM | POA: Diagnosis not present

## 2023-01-24 ENCOUNTER — Ambulatory Visit
Admission: RE | Admit: 2023-01-24 | Discharge: 2023-01-24 | Disposition: A | Discharge status: Home / Self Care | Payer: 59 | Source: Ambulatory Visit | Attending: Physician Assistant | Admitting: Physician Assistant

## 2023-01-24 ENCOUNTER — Other Ambulatory Visit: Payer: Self-pay | Admitting: Physician Assistant

## 2023-01-24 ENCOUNTER — Other Ambulatory Visit: Payer: Self-pay

## 2023-01-24 DIAGNOSIS — R1033 Periumbilical pain: Secondary | ICD-10-CM | POA: Diagnosis not present

## 2023-01-24 DIAGNOSIS — I700 Atherosclerosis of aorta: Secondary | ICD-10-CM | POA: Diagnosis not present

## 2023-01-24 DIAGNOSIS — R109 Unspecified abdominal pain: Secondary | ICD-10-CM | POA: Diagnosis not present

## 2023-01-24 MED ORDER — IOHEXOL 300 MG/ML  SOLN
100.0000 mL | Freq: Once | INTRAMUSCULAR | Status: AC | PRN
  Administered 2023-01-24: 100 mL via INTRAVENOUS

## 2023-01-24 MED ORDER — METRONIDAZOLE 500 MG PO TABS
500.0000 mg | ORAL_TABLET | Freq: Three times a day (TID) | ORAL | 0 refills | Status: DC
  Filled 2023-01-24: qty 15, 5d supply, fill #0

## 2023-01-27 DIAGNOSIS — E871 Hypo-osmolality and hyponatremia: Secondary | ICD-10-CM | POA: Diagnosis not present

## 2023-01-27 DIAGNOSIS — E876 Hypokalemia: Secondary | ICD-10-CM | POA: Diagnosis not present

## 2023-02-28 ENCOUNTER — Other Ambulatory Visit: Payer: Self-pay

## 2023-03-12 ENCOUNTER — Other Ambulatory Visit: Payer: Self-pay

## 2023-03-12 MED ORDER — CYCLOBENZAPRINE HCL 5 MG PO TABS
5.0000 mg | ORAL_TABLET | Freq: Three times a day (TID) | ORAL | 0 refills | Status: DC | PRN
  Filled 2023-03-12: qty 30, 10d supply, fill #0

## 2023-03-13 ENCOUNTER — Other Ambulatory Visit: Payer: Self-pay

## 2023-03-19 ENCOUNTER — Other Ambulatory Visit: Payer: Self-pay

## 2023-03-26 ENCOUNTER — Ambulatory Visit: Payer: 59 | Admitting: Dermatology

## 2023-03-26 VITALS — BP 120/83 | HR 75

## 2023-03-26 DIAGNOSIS — L578 Other skin changes due to chronic exposure to nonionizing radiation: Secondary | ICD-10-CM

## 2023-03-26 DIAGNOSIS — W908XXA Exposure to other nonionizing radiation, initial encounter: Secondary | ICD-10-CM | POA: Diagnosis not present

## 2023-03-26 DIAGNOSIS — D229 Melanocytic nevi, unspecified: Secondary | ICD-10-CM

## 2023-03-26 DIAGNOSIS — L814 Other melanin hyperpigmentation: Secondary | ICD-10-CM | POA: Diagnosis not present

## 2023-03-26 DIAGNOSIS — L821 Other seborrheic keratosis: Secondary | ICD-10-CM

## 2023-03-26 DIAGNOSIS — Z1283 Encounter for screening for malignant neoplasm of skin: Secondary | ICD-10-CM | POA: Diagnosis not present

## 2023-03-26 DIAGNOSIS — D1801 Hemangioma of skin and subcutaneous tissue: Secondary | ICD-10-CM | POA: Diagnosis not present

## 2023-03-26 DIAGNOSIS — L820 Inflamed seborrheic keratosis: Secondary | ICD-10-CM

## 2023-03-26 DIAGNOSIS — L570 Actinic keratosis: Secondary | ICD-10-CM

## 2023-03-26 DIAGNOSIS — Z808 Family history of malignant neoplasm of other organs or systems: Secondary | ICD-10-CM

## 2023-03-26 NOTE — Patient Instructions (Signed)
Cryotherapy Aftercare  Wash gently with soap and water everyday.   Apply Vaseline and Band-Aid daily until healed.    Melanoma ABCDEs  Melanoma is the most dangerous type of skin cancer, and is the leading cause of death from skin disease.  You are more likely to develop melanoma if you: Have light-colored skin, light-colored eyes, or red or blond hair Spend a lot of time in the sun Tan regularly, either outdoors or in a tanning bed Have had blistering sunburns, especially during childhood Have a close family member who has had a melanoma Have atypical moles or large birthmarks  Early detection of melanoma is key since treatment is typically straightforward and cure rates are extremely high if we catch it early.   The first sign of melanoma is often a change in a mole or a new dark spot.  The ABCDE system is a way of remembering the signs of melanoma.  A for asymmetry:  The two halves do not match. B for border:  The edges of the growth are irregular. C for color:  A mixture of colors are present instead of an even brown color. D for diameter:  Melanomas are usually (but not always) greater than 6mm - the size of a pencil eraser. E for evolution:  The spot keeps changing in size, shape, and color.  Please check your skin once per month between visits. You can use a small mirror in front and a large mirror behind you to keep an eye on the back side or your body.   If you see any new or changing lesions before your next follow-up, please call to schedule a visit.  Please continue daily skin protection including broad spectrum sunscreen SPF 30+ to sun-exposed areas, reapplying every 2 hours as needed when you're outdoors.    Due to recent changes in healthcare laws, you may see results of your pathology and/or laboratory studies on MyChart before the doctors have had a chance to review them. We understand that in some cases there may be results that are confusing or concerning to you.  Please understand that not all results are received at the same time and often the doctors may need to interpret multiple results in order to provide you with the best plan of care or course of treatment. Therefore, we ask that you please give us 2 business days to thoroughly review all your results before contacting the office for clarification. Should we see a critical lab result, you will be contacted sooner.   If You Need Anything After Your Visit  If you have any questions or concerns for your doctor, please call our main line at [phone] and press option 4 to reach your doctor's medical assistant. If no one answers, please leave a voicemail as directed and we will return your call as soon as possible. Messages left after 4 pm will be answered the following business day.   You may also send us a message via MyChart. We typically respond to MyChart messages within 1-2 business days.  For prescription refills, please ask your pharmacy to contact our office. Our fax number is [phone].  If you have an urgent issue when the clinic is closed that cannot wait until the next business day, you can page your doctor at the number below.    Please note that while we do our best to be available for urgent issues outside of office hours, we are not available 24/7.   If you have an urgent   issue and are unable to reach us, you may choose to seek medical care at your doctor's office, retail clinic, urgent care center, or emergency room.  If you have a medical emergency, please immediately call 911 or go to the emergency department.  Pager Numbers  - Dr. Kowalski: [phone]  - Dr. Moye: [phone]  - Dr. Stewart: [phone]  In the event of inclement weather, please call our main line at [phone] for an update on the status of any delays or closures.  Dermatology Medication Tips: Please keep the boxes that topical medications come in in order to help keep track of the  instructions about where and how to use these. Pharmacies typically print the medication instructions only on the boxes and not directly on the medication tubes.   If your medication is too expensive, please contact our office at [phone] option 4 or send us a message through MyChart.   We are unable to tell what your co-pay for medications will be in advance as this is different depending on your insurance coverage. However, we may be able to find a substitute medication at lower cost or fill out paperwork to get insurance to cover a needed medication.   If a prior authorization is required to get your medication covered by your insurance company, please allow us 1-2 business days to complete this process.  Drug prices often vary depending on where the prescription is filled and some pharmacies may offer cheaper prices.  The website www.goodrx.com contains coupons for medications through different pharmacies. The prices here do not account for what the cost may be with help from insurance (it may be cheaper with your insurance), but the website can give you the price if you did not use any insurance.  - You can print the associated coupon and take it with your prescription to the pharmacy.  - You may also stop by our office during regular business hours and pick up a GoodRx coupon card.  - If you need your prescription sent electronically to a different pharmacy, notify our office through [facility] MyChart or by phone at [phone] option 4.  

## 2023-03-26 NOTE — Progress Notes (Signed)
New Patient Visit   Subjective  Dana Benson is a 62 y.o. female who presents for the following: patient had originally made this appointment for a spot at right forearm, painful, present for at least 1 1/2 years and continued to get bigger, came off in February. No hx of skin cancer.  The patient presents for Total-Body Skin Exam (TBSE) for skin cancer screening and mole check.  The patient has spots, moles and lesions to be evaluated, some may be new or changing and the patient has concerns that these could be cancer.  FAMILY HISTORY OF SKIN CANCER What type(s):unknown Who affected:father  The following portions of the chart were reviewed this encounter and updated as appropriate: medications, allergies, medical history  Review of Systems:  No other skin or systemic complaints except as noted in HPI or Assessment and Plan.  Objective  Well appearing patient in no apparent distress; mood and affect are within normal limits.  A full examination was performed including scalp, head, eyes, ears, nose, lips, neck, chest, axillae, abdomen, back, buttocks, bilateral upper extremities, bilateral lower extremities, hands, feet, fingers, toes, fingernails, and toenails. All findings within normal limits unless otherwise noted below.   Relevant exam findings are noted in the Assessment and Plan.  Right Forearm x 2, R thigh x 2 (4) Erythematous stuck-on, waxy papule or plaque  L cheek x 1 Erythematous thin papules/macules with gritty scale.    Assessment & Plan   LENTIGINES, SEBORRHEIC KERATOSES, HEMANGIOMAS - Benign normal skin lesions - Benign-appearing - Call for any changes  MELANOCYTIC NEVI - Tan-brown and/or pink-flesh-colored symmetric macules and papules - Benign appearing on exam today - Observation - Call clinic for new or changing moles - Recommend daily use of broad spectrum spf 30+ sunscreen to sun-exposed areas.   ACTINIC DAMAGE - Chronic condition, secondary to  cumulative UV/sun exposure - diffuse scaly erythematous macules with underlying dyspigmentation - Recommend daily broad spectrum sunscreen SPF 30+ to sun-exposed areas, reapply every 2 hours as needed.  - Staying in the shade or wearing long sleeves, sun glasses (UVA+UVB protection) and wide brim hats (4-inch brim around the entire circumference of the hat) are also recommended for sun protection.  - Call for new or changing lesions.  SKIN CANCER SCREENING PERFORMED TODAY  Inflamed seborrheic keratosis (4) Right Forearm x 2, R thigh x 2  Symptomatic, irritating, patient would like treated.  Benign-appearing.  Call clinic for new or changing lesions.   Prior to procedure, discussed risks of blister formation, small wound, skin dyspigmentation, or rare scar following treatment. Recommend Vaseline ointment to treated areas while healing.   Destruction of lesion - Right Forearm x 2, R thigh x 2 Complexity: simple   Destruction method: cryotherapy   Informed consent: discussed and consent obtained   Timeout:  patient name, date of birth, surgical site, and procedure verified Lesion destroyed using liquid nitrogen: Yes   Region frozen until ice ball extended beyond lesion: Yes   Outcome: patient tolerated procedure well with no complications   Post-procedure details: wound care instructions given    AK (actinic keratosis) L cheek x 1  Actinic keratoses are precancerous spots that appear secondary to cumulative UV radiation exposure/sun exposure over time. They are chronic with expected duration over 1 year. A portion of actinic keratoses will progress to squamous cell carcinoma of the skin. It is not possible to reliably predict which spots will progress to skin cancer and so treatment is recommended to prevent development  of skin cancer.  Recommend daily broad spectrum sunscreen SPF 30+ to sun-exposed areas, reapply every 2 hours as needed.  Recommend staying in the shade or wearing long  sleeves, sun glasses (UVA+UVB protection) and wide brim hats (4-inch brim around the entire circumference of the hat). Call for new or changing lesions.   Destruction of lesion - L cheek x 1 Complexity: simple   Destruction method: cryotherapy   Informed consent: discussed and consent obtained   Timeout:  patient name, date of birth, surgical site, and procedure verified Lesion destroyed using liquid nitrogen: Yes   Region frozen until ice ball extended beyond lesion: Yes   Outcome: patient tolerated procedure well with no complications   Post-procedure details: wound care instructions given     Return for 1-2 years, Hx AK.  Anise Salvo, RMA, am acting as scribe for Armida Sans, MD .  Documentation: I have reviewed the above documentation for accuracy and completeness, and I agree with the above.  Armida Sans, MD

## 2023-03-28 ENCOUNTER — Encounter: Payer: Self-pay | Admitting: Dermatology

## 2023-03-31 ENCOUNTER — Other Ambulatory Visit: Payer: Self-pay

## 2023-03-31 MED ORDER — AMPHETAMINE-DEXTROAMPHETAMINE 20 MG PO TABS
20.0000 mg | ORAL_TABLET | Freq: Two times a day (BID) | ORAL | 0 refills | Status: DC
  Filled 2023-03-31: qty 180, 90d supply, fill #0

## 2023-03-31 MED ORDER — ESZOPICLONE 3 MG PO TABS
3.0000 mg | ORAL_TABLET | Freq: Every evening | ORAL | 0 refills | Status: DC | PRN
  Filled 2023-03-31: qty 90, 90d supply, fill #0

## 2023-03-31 MED ORDER — SERTRALINE HCL 100 MG PO TABS
200.0000 mg | ORAL_TABLET | Freq: Every day | ORAL | 0 refills | Status: DC
  Filled 2023-03-31: qty 180, 90d supply, fill #0

## 2023-03-31 MED ORDER — HYDROXYZINE PAMOATE 50 MG PO CAPS
50.0000 mg | ORAL_CAPSULE | Freq: Every evening | ORAL | 0 refills | Status: DC | PRN
  Filled 2023-03-31: qty 90, 90d supply, fill #0

## 2023-03-31 MED ORDER — BUPROPION HCL ER (SR) 200 MG PO TB12
200.0000 mg | ORAL_TABLET | Freq: Every day | ORAL | 0 refills | Status: AC
  Filled 2023-03-31 – 2023-09-15 (×2): qty 90, 90d supply, fill #0

## 2023-04-08 ENCOUNTER — Other Ambulatory Visit: Payer: Self-pay

## 2023-04-08 DIAGNOSIS — F411 Generalized anxiety disorder: Secondary | ICD-10-CM | POA: Diagnosis not present

## 2023-04-08 DIAGNOSIS — F331 Major depressive disorder, recurrent, moderate: Secondary | ICD-10-CM | POA: Diagnosis not present

## 2023-04-08 DIAGNOSIS — F5105 Insomnia due to other mental disorder: Secondary | ICD-10-CM | POA: Diagnosis not present

## 2023-04-08 MED ORDER — SERTRALINE HCL 100 MG PO TABS
200.0000 mg | ORAL_TABLET | Freq: Every day | ORAL | 0 refills | Status: AC
  Filled 2023-06-26: qty 180, 90d supply, fill #0

## 2023-04-08 MED ORDER — AMPHETAMINE-DEXTROAMPHETAMINE 20 MG PO TABS: 20.0000 mg | ORAL_TABLET | Freq: Two times a day (BID) | ORAL | 0 refills | Status: AC

## 2023-04-08 MED ORDER — BUPROPION HCL ER (SR) 200 MG PO TB12: 200.0000 mg | ORAL_TABLET | Freq: Every day | ORAL | 0 refills | Status: AC

## 2023-04-08 MED ORDER — HYDROXYZINE PAMOATE 50 MG PO CAPS
50.0000 mg | ORAL_CAPSULE | Freq: Every evening | ORAL | 0 refills | Status: AC | PRN
  Filled 2023-12-26: qty 90, 90d supply, fill #0

## 2023-04-08 MED ORDER — ESZOPICLONE 3 MG PO TABS: 3.0000 mg | ORAL_TABLET | Freq: Every evening | ORAL | 0 refills | Status: AC | PRN

## 2023-04-09 ENCOUNTER — Other Ambulatory Visit: Payer: Self-pay | Admitting: Oncology

## 2023-04-09 ENCOUNTER — Other Ambulatory Visit: Payer: Self-pay

## 2023-04-09 DIAGNOSIS — Z006 Encounter for examination for normal comparison and control in clinical research program: Secondary | ICD-10-CM

## 2023-04-10 ENCOUNTER — Other Ambulatory Visit
Admission: RE | Admit: 2023-04-10 | Discharge: 2023-04-10 | Disposition: A | Discharge status: Home / Self Care | Payer: 59 | Source: Ambulatory Visit | Attending: Oncology | Admitting: Oncology

## 2023-04-10 DIAGNOSIS — Z006 Encounter for examination for normal comparison and control in clinical research program: Secondary | ICD-10-CM

## 2023-04-24 LAB — HELIX MOLECULAR SCREEN: Genetic Analysis Overall Interpretation: NEGATIVE

## 2023-05-26 ENCOUNTER — Other Ambulatory Visit: Payer: Self-pay

## 2023-05-26 DIAGNOSIS — F331 Major depressive disorder, recurrent, moderate: Secondary | ICD-10-CM | POA: Diagnosis not present

## 2023-05-26 DIAGNOSIS — F5105 Insomnia due to other mental disorder: Secondary | ICD-10-CM | POA: Diagnosis not present

## 2023-05-26 DIAGNOSIS — F411 Generalized anxiety disorder: Secondary | ICD-10-CM | POA: Diagnosis not present

## 2023-05-26 MED ORDER — BUPROPION HCL ER (SR) 200 MG PO TB12
200.0000 mg | ORAL_TABLET | Freq: Every day | ORAL | 0 refills | Status: AC
  Filled 2023-05-26: qty 90, 90d supply, fill #0

## 2023-05-26 MED ORDER — SERTRALINE HCL 100 MG PO TABS
200.0000 mg | ORAL_TABLET | Freq: Every day | ORAL | 0 refills | Status: AC
  Filled 2023-05-26 – 2023-06-26 (×2): qty 180, 90d supply, fill #0

## 2023-05-26 MED ORDER — ESZOPICLONE 3 MG PO TABS
3.0000 mg | ORAL_TABLET | Freq: Every evening | ORAL | 0 refills | Status: AC | PRN
  Filled 2023-06-26 – 2023-06-30 (×3): qty 90, 90d supply, fill #0

## 2023-05-26 MED ORDER — HYDROXYZINE PAMOATE 50 MG PO CAPS
50.0000 mg | ORAL_CAPSULE | Freq: Every evening | ORAL | 0 refills | Status: AC | PRN
  Filled 2023-05-26 – 2023-06-26 (×2): qty 90, 90d supply, fill #0

## 2023-05-26 MED ORDER — AMPHETAMINE-DEXTROAMPHETAMINE 20 MG PO TABS
20.0000 mg | ORAL_TABLET | Freq: Two times a day (BID) | ORAL | 0 refills | Status: AC
  Filled 2023-06-26 – 2023-06-30 (×3): qty 180, 90d supply, fill #0

## 2023-05-27 ENCOUNTER — Other Ambulatory Visit: Payer: Self-pay

## 2023-05-30 ENCOUNTER — Other Ambulatory Visit: Payer: Self-pay

## 2023-06-24 ENCOUNTER — Other Ambulatory Visit: Payer: Self-pay

## 2023-06-24 DIAGNOSIS — R1084 Generalized abdominal pain: Secondary | ICD-10-CM | POA: Diagnosis not present

## 2023-06-24 DIAGNOSIS — R194 Change in bowel habit: Secondary | ICD-10-CM | POA: Diagnosis not present

## 2023-06-24 DIAGNOSIS — F17209 Nicotine dependence, unspecified, with unspecified nicotine-induced disorders: Secondary | ICD-10-CM | POA: Diagnosis not present

## 2023-06-24 DIAGNOSIS — Z8719 Personal history of other diseases of the digestive system: Secondary | ICD-10-CM | POA: Diagnosis not present

## 2023-06-24 DIAGNOSIS — R933 Abnormal findings on diagnostic imaging of other parts of digestive tract: Secondary | ICD-10-CM | POA: Diagnosis not present

## 2023-06-24 MED ORDER — NA SULFATE-K SULFATE-MG SULF 17.5-3.13-1.6 GM/177ML PO SOLN
ORAL | 0 refills | Status: DC
  Filled 2023-06-24: qty 354, 2d supply, fill #0

## 2023-06-25 DIAGNOSIS — R1084 Generalized abdominal pain: Secondary | ICD-10-CM | POA: Diagnosis not present

## 2023-06-25 DIAGNOSIS — R194 Change in bowel habit: Secondary | ICD-10-CM | POA: Diagnosis not present

## 2023-06-25 DIAGNOSIS — I10 Essential (primary) hypertension: Secondary | ICD-10-CM | POA: Diagnosis not present

## 2023-06-25 DIAGNOSIS — Z8719 Personal history of other diseases of the digestive system: Secondary | ICD-10-CM | POA: Diagnosis not present

## 2023-06-25 DIAGNOSIS — E119 Type 2 diabetes mellitus without complications: Secondary | ICD-10-CM | POA: Diagnosis not present

## 2023-06-26 ENCOUNTER — Other Ambulatory Visit: Payer: Self-pay

## 2023-06-27 ENCOUNTER — Other Ambulatory Visit: Payer: Self-pay

## 2023-06-30 ENCOUNTER — Other Ambulatory Visit: Payer: Self-pay

## 2023-07-02 ENCOUNTER — Other Ambulatory Visit: Payer: Self-pay

## 2023-07-02 DIAGNOSIS — I10 Essential (primary) hypertension: Secondary | ICD-10-CM | POA: Diagnosis not present

## 2023-07-02 DIAGNOSIS — R1032 Left lower quadrant pain: Secondary | ICD-10-CM | POA: Diagnosis not present

## 2023-07-02 DIAGNOSIS — Z23 Encounter for immunization: Secondary | ICD-10-CM | POA: Diagnosis not present

## 2023-07-02 DIAGNOSIS — G8929 Other chronic pain: Secondary | ICD-10-CM | POA: Diagnosis not present

## 2023-07-02 DIAGNOSIS — M545 Low back pain, unspecified: Secondary | ICD-10-CM | POA: Diagnosis not present

## 2023-07-02 DIAGNOSIS — E119 Type 2 diabetes mellitus without complications: Secondary | ICD-10-CM | POA: Diagnosis not present

## 2023-07-02 DIAGNOSIS — Z0000 Encounter for general adult medical examination without abnormal findings: Secondary | ICD-10-CM | POA: Diagnosis not present

## 2023-07-02 DIAGNOSIS — Z1231 Encounter for screening mammogram for malignant neoplasm of breast: Secondary | ICD-10-CM | POA: Diagnosis not present

## 2023-07-02 MED ORDER — MOUNJARO 10 MG/0.5ML ~~LOC~~ SOAJ
10.0000 mg | SUBCUTANEOUS | 2 refills | Status: DC
Start: 2023-07-02 — End: 2023-12-31
  Filled 2023-07-02 – 2023-08-13 (×4): qty 2, 28d supply, fill #0
  Filled 2023-09-15: qty 2, 28d supply, fill #1
  Filled 2023-12-08: qty 2, 28d supply, fill #2

## 2023-07-22 ENCOUNTER — Other Ambulatory Visit: Payer: Self-pay

## 2023-07-23 ENCOUNTER — Other Ambulatory Visit: Payer: Self-pay

## 2023-07-23 MED ORDER — ROSUVASTATIN CALCIUM 5 MG PO TABS
5.0000 mg | ORAL_TABLET | Freq: Every day | ORAL | 1 refills | Status: AC
  Filled 2023-07-23: qty 90, 90d supply, fill #0
  Filled 2024-03-15: qty 90, 90d supply, fill #1

## 2023-08-08 ENCOUNTER — Other Ambulatory Visit: Payer: Self-pay

## 2023-08-09 ENCOUNTER — Other Ambulatory Visit: Payer: Self-pay

## 2023-08-09 MED ORDER — HYDROCHLOROTHIAZIDE 25 MG PO TABS
25.0000 mg | ORAL_TABLET | Freq: Every day | ORAL | 1 refills | Status: DC
  Filled 2023-08-09: qty 90, 90d supply, fill #0

## 2023-08-11 ENCOUNTER — Other Ambulatory Visit: Payer: Self-pay

## 2023-08-13 ENCOUNTER — Other Ambulatory Visit: Payer: Self-pay

## 2023-08-13 MED ORDER — MOUNJARO 10 MG/0.5ML ~~LOC~~ SOAJ
10.0000 mg | SUBCUTANEOUS | 0 refills | Status: DC
  Filled 2023-08-13 – 2023-10-09 (×2): qty 2, 28d supply, fill #0

## 2023-08-14 ENCOUNTER — Other Ambulatory Visit: Payer: Self-pay

## 2023-08-20 ENCOUNTER — Other Ambulatory Visit: Payer: Self-pay

## 2023-08-20 DIAGNOSIS — F411 Generalized anxiety disorder: Secondary | ICD-10-CM | POA: Diagnosis not present

## 2023-08-20 DIAGNOSIS — F5105 Insomnia due to other mental disorder: Secondary | ICD-10-CM | POA: Diagnosis not present

## 2023-08-20 DIAGNOSIS — F331 Major depressive disorder, recurrent, moderate: Secondary | ICD-10-CM | POA: Diagnosis not present

## 2023-08-20 MED ORDER — AMPHETAMINE-DEXTROAMPHETAMINE 20 MG PO TABS
20.0000 mg | ORAL_TABLET | Freq: Two times a day (BID) | ORAL | 0 refills | Status: DC
  Filled 2023-09-29: qty 180, 90d supply, fill #0

## 2023-08-20 MED ORDER — BUPROPION HCL ER (SR) 150 MG PO TB12
150.0000 mg | ORAL_TABLET | Freq: Two times a day (BID) | ORAL | 0 refills | Status: DC
  Filled 2023-08-20: qty 60, 30d supply, fill #0

## 2023-08-20 MED ORDER — SERTRALINE HCL 100 MG PO TABS
200.0000 mg | ORAL_TABLET | Freq: Every day | ORAL | 0 refills | Status: AC
  Filled 2023-08-20 – 2023-09-29 (×2): qty 180, 90d supply, fill #0

## 2023-08-20 MED ORDER — ESZOPICLONE 3 MG PO TABS
3.0000 mg | ORAL_TABLET | Freq: Every evening | ORAL | 0 refills | Status: AC | PRN
  Filled 2023-09-29: qty 90, 90d supply, fill #0

## 2023-08-20 MED ORDER — HYDROXYZINE PAMOATE 50 MG PO CAPS
50.0000 mg | ORAL_CAPSULE | Freq: Every evening | ORAL | 0 refills | Status: AC | PRN
  Filled 2023-08-20 – 2023-09-29 (×2): qty 90, 90d supply, fill #0

## 2023-08-25 ENCOUNTER — Encounter: Payer: Self-pay | Admitting: Gastroenterology

## 2023-08-27 ENCOUNTER — Other Ambulatory Visit: Payer: Self-pay

## 2023-08-27 MED ORDER — LOSARTAN POTASSIUM 100 MG PO TABS
100.0000 mg | ORAL_TABLET | Freq: Every day | ORAL | 1 refills | Status: DC
  Filled 2023-08-27: qty 90, 90d supply, fill #0

## 2023-09-11 ENCOUNTER — Other Ambulatory Visit: Payer: Self-pay

## 2023-09-11 DIAGNOSIS — F411 Generalized anxiety disorder: Secondary | ICD-10-CM | POA: Diagnosis not present

## 2023-09-11 DIAGNOSIS — F5105 Insomnia due to other mental disorder: Secondary | ICD-10-CM | POA: Diagnosis not present

## 2023-09-11 DIAGNOSIS — F331 Major depressive disorder, recurrent, moderate: Secondary | ICD-10-CM | POA: Diagnosis not present

## 2023-09-11 MED ORDER — BUPROPION HCL ER (SR) 150 MG PO TB12
150.0000 mg | ORAL_TABLET | Freq: Two times a day (BID) | ORAL | 0 refills | Status: DC
  Filled 2023-09-11 – 2023-09-29 (×2): qty 180, 90d supply, fill #0

## 2023-09-15 ENCOUNTER — Other Ambulatory Visit: Payer: Self-pay

## 2023-09-22 ENCOUNTER — Encounter: Payer: Self-pay | Admitting: Gastroenterology

## 2023-09-26 ENCOUNTER — Ambulatory Visit: Admission: RE | Admit: 2023-09-26 | Payer: 59 | Source: Home / Self Care | Admitting: Gastroenterology

## 2023-09-26 SURGERY — COLONOSCOPY WITH PROPOFOL
Anesthesia: General

## 2023-09-29 ENCOUNTER — Other Ambulatory Visit: Payer: Self-pay

## 2023-10-09 ENCOUNTER — Other Ambulatory Visit: Payer: Self-pay

## 2023-10-09 DIAGNOSIS — F411 Generalized anxiety disorder: Secondary | ICD-10-CM | POA: Diagnosis not present

## 2023-10-09 DIAGNOSIS — F5105 Insomnia due to other mental disorder: Secondary | ICD-10-CM | POA: Diagnosis not present

## 2023-10-09 DIAGNOSIS — F331 Major depressive disorder, recurrent, moderate: Secondary | ICD-10-CM | POA: Diagnosis not present

## 2023-10-09 MED ORDER — ESZOPICLONE 3 MG PO TABS
3.0000 mg | ORAL_TABLET | Freq: Every evening | ORAL | 0 refills | Status: AC
  Filled 2023-12-25: qty 90, 90d supply, fill #0

## 2023-10-09 MED ORDER — SERTRALINE HCL 100 MG PO TABS: 100.0000 mg | ORAL_TABLET | Freq: Every day | ORAL | 0 refills | Status: AC

## 2023-10-09 MED ORDER — VRAYLAR 1.5 MG PO CAPS
1.5000 mg | ORAL_CAPSULE | Freq: Every day | ORAL | 0 refills | Status: DC
  Filled 2023-10-09: qty 30, 30d supply, fill #0

## 2023-10-09 MED ORDER — BUPROPION HCL ER (SR) 150 MG PO TB12: 150.0000 mg | ORAL_TABLET | Freq: Two times a day (BID) | ORAL | 0 refills | Status: AC

## 2023-10-27 ENCOUNTER — Other Ambulatory Visit: Payer: Self-pay

## 2023-10-27 DIAGNOSIS — F411 Generalized anxiety disorder: Secondary | ICD-10-CM | POA: Diagnosis not present

## 2023-10-27 DIAGNOSIS — F5105 Insomnia due to other mental disorder: Secondary | ICD-10-CM | POA: Diagnosis not present

## 2023-10-27 DIAGNOSIS — F331 Major depressive disorder, recurrent, moderate: Secondary | ICD-10-CM | POA: Diagnosis not present

## 2023-10-27 MED ORDER — VRAYLAR 1.5 MG PO CAPS
1.5000 mg | ORAL_CAPSULE | Freq: Every day | ORAL | 0 refills | Status: AC
  Filled 2023-10-27: qty 30, 30d supply, fill #0

## 2023-11-10 ENCOUNTER — Other Ambulatory Visit: Payer: Self-pay

## 2023-11-10 MED ORDER — MOUNJARO 10 MG/0.5ML ~~LOC~~ SOAJ
10.0000 mg | SUBCUTANEOUS | 0 refills | Status: AC
  Filled 2023-11-10: qty 2, 28d supply, fill #0

## 2023-11-13 ENCOUNTER — Other Ambulatory Visit: Payer: Self-pay

## 2023-11-14 DIAGNOSIS — F411 Generalized anxiety disorder: Secondary | ICD-10-CM | POA: Diagnosis not present

## 2023-11-14 DIAGNOSIS — F5105 Insomnia due to other mental disorder: Secondary | ICD-10-CM | POA: Diagnosis not present

## 2023-11-14 DIAGNOSIS — F331 Major depressive disorder, recurrent, moderate: Secondary | ICD-10-CM | POA: Diagnosis not present

## 2023-12-08 ENCOUNTER — Other Ambulatory Visit: Payer: Self-pay

## 2023-12-09 ENCOUNTER — Other Ambulatory Visit: Payer: Self-pay

## 2023-12-10 ENCOUNTER — Other Ambulatory Visit: Payer: Self-pay

## 2023-12-10 MED ORDER — FLUTICASONE PROPIONATE 50 MCG/ACT NA SUSP
2.0000 | Freq: Every day | NASAL | 2 refills | Status: DC
Start: 2023-12-10 — End: 2024-08-23
  Filled 2023-12-10: qty 16, 30d supply, fill #0
  Filled 2024-02-24: qty 16, 30d supply, fill #1
  Filled 2024-04-26 – 2024-05-20 (×2): qty 16, 30d supply, fill #2

## 2023-12-18 ENCOUNTER — Other Ambulatory Visit: Payer: Self-pay

## 2023-12-18 DIAGNOSIS — F5105 Insomnia due to other mental disorder: Secondary | ICD-10-CM | POA: Diagnosis not present

## 2023-12-18 DIAGNOSIS — F331 Major depressive disorder, recurrent, moderate: Secondary | ICD-10-CM | POA: Diagnosis not present

## 2023-12-18 DIAGNOSIS — F411 Generalized anxiety disorder: Secondary | ICD-10-CM | POA: Diagnosis not present

## 2023-12-18 MED ORDER — BUPROPION HCL ER (SR) 150 MG PO TB12
150.0000 mg | ORAL_TABLET | Freq: Two times a day (BID) | ORAL | 0 refills | Status: AC
  Filled 2023-12-18: qty 180, 90d supply, fill #0

## 2023-12-18 MED ORDER — SERTRALINE HCL 100 MG PO TABS
200.0000 mg | ORAL_TABLET | Freq: Every day | ORAL | 0 refills | Status: AC
  Filled 2023-12-18: qty 180, 90d supply, fill #0

## 2023-12-18 MED ORDER — AMPHETAMINE-DEXTROAMPHETAMINE 20 MG PO TABS
20.0000 mg | ORAL_TABLET | Freq: Two times a day (BID) | ORAL | 0 refills | Status: DC
Start: 2023-12-18 — End: 2024-01-21
  Filled 2023-12-18 – 2023-12-26 (×3): qty 180, 90d supply, fill #0

## 2023-12-18 MED ORDER — ESZOPICLONE 3 MG PO TABS
3.0000 mg | ORAL_TABLET | Freq: Every day | ORAL | 0 refills | Status: AC
Start: 2023-12-18 — End: ?
  Filled 2023-12-18 – 2023-12-26 (×2): qty 90, 90d supply, fill #0

## 2023-12-18 MED ORDER — REXULTI 0.5 MG PO TABS
0.5000 mg | ORAL_TABLET | Freq: Every day | ORAL | 0 refills | Status: AC
  Filled 2023-12-18: qty 30, 30d supply, fill #0

## 2023-12-23 ENCOUNTER — Other Ambulatory Visit: Payer: Self-pay

## 2023-12-24 DIAGNOSIS — E119 Type 2 diabetes mellitus without complications: Secondary | ICD-10-CM | POA: Diagnosis not present

## 2023-12-24 DIAGNOSIS — I10 Essential (primary) hypertension: Secondary | ICD-10-CM | POA: Diagnosis not present

## 2023-12-25 ENCOUNTER — Other Ambulatory Visit: Payer: Self-pay

## 2023-12-26 ENCOUNTER — Other Ambulatory Visit: Payer: Self-pay

## 2023-12-31 ENCOUNTER — Other Ambulatory Visit: Payer: Self-pay

## 2023-12-31 DIAGNOSIS — R079 Chest pain, unspecified: Secondary | ICD-10-CM | POA: Diagnosis not present

## 2023-12-31 DIAGNOSIS — E782 Mixed hyperlipidemia: Secondary | ICD-10-CM | POA: Diagnosis not present

## 2023-12-31 DIAGNOSIS — E119 Type 2 diabetes mellitus without complications: Secondary | ICD-10-CM | POA: Diagnosis not present

## 2023-12-31 DIAGNOSIS — R232 Flushing: Secondary | ICD-10-CM | POA: Diagnosis not present

## 2023-12-31 DIAGNOSIS — R0602 Shortness of breath: Secondary | ICD-10-CM | POA: Diagnosis not present

## 2023-12-31 DIAGNOSIS — F988 Other specified behavioral and emotional disorders with onset usually occurring in childhood and adolescence: Secondary | ICD-10-CM | POA: Diagnosis not present

## 2023-12-31 DIAGNOSIS — F418 Other specified anxiety disorders: Secondary | ICD-10-CM | POA: Diagnosis not present

## 2023-12-31 DIAGNOSIS — I10 Essential (primary) hypertension: Secondary | ICD-10-CM | POA: Diagnosis not present

## 2023-12-31 MED ORDER — ROSUVASTATIN CALCIUM 5 MG PO TABS
5.0000 mg | ORAL_TABLET | Freq: Every day | ORAL | 1 refills | Status: AC
  Filled 2023-12-31: qty 90, 90d supply, fill #0

## 2023-12-31 MED ORDER — LOSARTAN POTASSIUM 50 MG PO TABS
50.0000 mg | ORAL_TABLET | Freq: Every day | ORAL | 1 refills | Status: AC
Start: 2023-12-31 — End: ?
  Filled 2023-12-31: qty 90, 90d supply, fill #0

## 2023-12-31 MED ORDER — MOUNJARO 10 MG/0.5ML ~~LOC~~ SOAJ
10.0000 mg | SUBCUTANEOUS | 2 refills | Status: DC
Start: 2023-12-31 — End: 2024-03-31
  Filled 2023-12-31: qty 2, 28d supply, fill #0
  Filled 2024-02-02: qty 2, 28d supply, fill #1
  Filled 2024-03-12: qty 2, 28d supply, fill #2

## 2023-12-31 MED ORDER — ONDANSETRON 4 MG PO TBDP
4.0000 mg | ORAL_TABLET | Freq: Three times a day (TID) | ORAL | 0 refills | Status: AC | PRN
  Filled 2023-12-31: qty 20, 7d supply, fill #0

## 2024-01-01 ENCOUNTER — Other Ambulatory Visit: Payer: Self-pay

## 2024-01-21 ENCOUNTER — Other Ambulatory Visit: Payer: Self-pay

## 2024-01-21 DIAGNOSIS — F5105 Insomnia due to other mental disorder: Secondary | ICD-10-CM | POA: Diagnosis not present

## 2024-01-21 DIAGNOSIS — F331 Major depressive disorder, recurrent, moderate: Secondary | ICD-10-CM | POA: Diagnosis not present

## 2024-01-21 DIAGNOSIS — F411 Generalized anxiety disorder: Secondary | ICD-10-CM | POA: Diagnosis not present

## 2024-01-21 MED ORDER — BUPROPION HCL ER (SR) 150 MG PO TB12
150.0000 mg | ORAL_TABLET | Freq: Two times a day (BID) | ORAL | 0 refills | Status: AC
  Filled 2024-01-21: qty 180, 90d supply, fill #0

## 2024-01-21 MED ORDER — SERTRALINE HCL 100 MG PO TABS
200.0000 mg | ORAL_TABLET | Freq: Every day | ORAL | 0 refills | Status: AC
Start: 2024-01-21 — End: ?
  Filled 2024-01-21: qty 180, 90d supply, fill #0

## 2024-01-21 MED ORDER — ESZOPICLONE 3 MG PO TABS: 3.0000 mg | ORAL_TABLET | Freq: Every evening | ORAL | 0 refills | Status: AC | PRN

## 2024-01-21 MED ORDER — AMPHETAMINE-DEXTROAMPHETAMINE 20 MG PO TABS: 20.0000 mg | ORAL_TABLET | Freq: Two times a day (BID) | ORAL | 0 refills | Status: AC

## 2024-01-28 DIAGNOSIS — E782 Mixed hyperlipidemia: Secondary | ICD-10-CM | POA: Diagnosis not present

## 2024-01-28 DIAGNOSIS — I10 Essential (primary) hypertension: Secondary | ICD-10-CM | POA: Diagnosis not present

## 2024-01-28 DIAGNOSIS — E119 Type 2 diabetes mellitus without complications: Secondary | ICD-10-CM | POA: Diagnosis not present

## 2024-01-28 DIAGNOSIS — F418 Other specified anxiety disorders: Secondary | ICD-10-CM | POA: Diagnosis not present

## 2024-01-28 DIAGNOSIS — F988 Other specified behavioral and emotional disorders with onset usually occurring in childhood and adolescence: Secondary | ICD-10-CM | POA: Diagnosis not present

## 2024-01-28 DIAGNOSIS — R079 Chest pain, unspecified: Secondary | ICD-10-CM | POA: Diagnosis not present

## 2024-01-28 DIAGNOSIS — R0602 Shortness of breath: Secondary | ICD-10-CM | POA: Diagnosis not present

## 2024-02-05 DIAGNOSIS — I4510 Unspecified right bundle-branch block: Secondary | ICD-10-CM | POA: Diagnosis not present

## 2024-02-05 DIAGNOSIS — I10 Essential (primary) hypertension: Secondary | ICD-10-CM | POA: Diagnosis not present

## 2024-02-05 DIAGNOSIS — R0602 Shortness of breath: Secondary | ICD-10-CM | POA: Diagnosis not present

## 2024-02-05 DIAGNOSIS — R079 Chest pain, unspecified: Secondary | ICD-10-CM | POA: Diagnosis not present

## 2024-02-05 DIAGNOSIS — E7800 Pure hypercholesterolemia, unspecified: Secondary | ICD-10-CM | POA: Diagnosis not present

## 2024-02-19 DIAGNOSIS — R079 Chest pain, unspecified: Secondary | ICD-10-CM | POA: Diagnosis not present

## 2024-02-19 DIAGNOSIS — R0602 Shortness of breath: Secondary | ICD-10-CM | POA: Diagnosis not present

## 2024-02-26 DIAGNOSIS — I10 Essential (primary) hypertension: Secondary | ICD-10-CM | POA: Diagnosis not present

## 2024-02-26 DIAGNOSIS — E7800 Pure hypercholesterolemia, unspecified: Secondary | ICD-10-CM | POA: Diagnosis not present

## 2024-02-26 DIAGNOSIS — R0602 Shortness of breath: Secondary | ICD-10-CM | POA: Diagnosis not present

## 2024-02-26 DIAGNOSIS — R079 Chest pain, unspecified: Secondary | ICD-10-CM | POA: Diagnosis not present

## 2024-02-26 DIAGNOSIS — I4510 Unspecified right bundle-branch block: Secondary | ICD-10-CM | POA: Diagnosis not present

## 2024-03-18 ENCOUNTER — Other Ambulatory Visit: Payer: Self-pay

## 2024-03-18 DIAGNOSIS — F411 Generalized anxiety disorder: Secondary | ICD-10-CM | POA: Diagnosis not present

## 2024-03-18 DIAGNOSIS — F5105 Insomnia due to other mental disorder: Secondary | ICD-10-CM | POA: Diagnosis not present

## 2024-03-18 DIAGNOSIS — F331 Major depressive disorder, recurrent, moderate: Secondary | ICD-10-CM | POA: Diagnosis not present

## 2024-03-18 MED ORDER — BUPROPION HCL ER (SR) 150 MG PO TB12
150.0000 mg | ORAL_TABLET | Freq: Two times a day (BID) | ORAL | 0 refills | Status: AC
  Filled 2024-03-18: qty 180, 90d supply, fill #0

## 2024-03-18 MED ORDER — SERTRALINE HCL 100 MG PO TABS
200.0000 mg | ORAL_TABLET | Freq: Every day | ORAL | 0 refills | Status: AC
  Filled 2024-03-18: qty 180, 90d supply, fill #0

## 2024-03-18 MED ORDER — ESZOPICLONE 3 MG PO TABS
3.0000 mg | ORAL_TABLET | Freq: Every day | ORAL | 0 refills | Status: AC
  Filled 2024-03-18 – 2024-03-23 (×2): qty 90, 90d supply, fill #0

## 2024-03-18 MED ORDER — AMPHETAMINE-DEXTROAMPHETAMINE 20 MG PO TABS
20.0000 mg | ORAL_TABLET | Freq: Two times a day (BID) | ORAL | 0 refills | Status: AC
  Filled 2024-03-18: qty 180, 90d supply, fill #0

## 2024-03-23 ENCOUNTER — Other Ambulatory Visit: Payer: Self-pay

## 2024-03-24 ENCOUNTER — Other Ambulatory Visit: Payer: Self-pay

## 2024-03-25 ENCOUNTER — Other Ambulatory Visit: Payer: Self-pay

## 2024-03-25 MED ORDER — HYDROXYZINE PAMOATE 50 MG PO CAPS
50.0000 mg | ORAL_CAPSULE | Freq: Two times a day (BID) | ORAL | 0 refills | Status: DC | PRN
  Filled 2024-03-25: qty 60, 30d supply, fill #0

## 2024-03-31 ENCOUNTER — Other Ambulatory Visit: Payer: Self-pay

## 2024-03-31 MED ORDER — MOUNJARO 10 MG/0.5ML ~~LOC~~ SOAJ
10.0000 mg | SUBCUTANEOUS | 2 refills | Status: DC
  Filled 2024-03-31 – 2024-04-13 (×2): qty 2, 28d supply, fill #0
  Filled 2024-05-20: qty 2, 28d supply, fill #1
  Filled 2024-06-17: qty 2, 28d supply, fill #2

## 2024-04-13 ENCOUNTER — Other Ambulatory Visit: Payer: Self-pay

## 2024-04-26 ENCOUNTER — Other Ambulatory Visit: Payer: Self-pay

## 2024-04-27 ENCOUNTER — Other Ambulatory Visit: Payer: Self-pay

## 2024-04-29 ENCOUNTER — Other Ambulatory Visit: Payer: Self-pay

## 2024-05-06 ENCOUNTER — Other Ambulatory Visit: Payer: Self-pay

## 2024-05-19 ENCOUNTER — Other Ambulatory Visit: Payer: Self-pay

## 2024-05-19 DIAGNOSIS — F411 Generalized anxiety disorder: Secondary | ICD-10-CM | POA: Diagnosis not present

## 2024-05-19 DIAGNOSIS — F5105 Insomnia due to other mental disorder: Secondary | ICD-10-CM | POA: Diagnosis not present

## 2024-05-19 DIAGNOSIS — F331 Major depressive disorder, recurrent, moderate: Secondary | ICD-10-CM | POA: Diagnosis not present

## 2024-05-19 MED ORDER — HYDROXYZINE PAMOATE 50 MG PO CAPS
50.0000 mg | ORAL_CAPSULE | Freq: Two times a day (BID) | ORAL | 0 refills | Status: DC | PRN
  Filled 2024-05-19: qty 60, 30d supply, fill #0

## 2024-05-19 MED ORDER — AMPHETAMINE-DEXTROAMPHETAMINE 20 MG PO TABS
20.0000 mg | ORAL_TABLET | Freq: Two times a day (BID) | ORAL | 0 refills | Status: AC
  Filled 2024-05-19: qty 180, 90d supply, fill #0

## 2024-05-19 MED ORDER — ESZOPICLONE 3 MG PO TABS
3.0000 mg | ORAL_TABLET | Freq: Every evening | ORAL | 0 refills | Status: DC | PRN
  Filled 2024-05-19 – 2024-06-24 (×2): qty 90, 90d supply, fill #0

## 2024-05-19 MED ORDER — BUPROPION HCL ER (SR) 150 MG PO TB12
150.0000 mg | ORAL_TABLET | Freq: Two times a day (BID) | ORAL | 0 refills | Status: AC
  Filled 2024-05-19: qty 180, 90d supply, fill #0

## 2024-05-19 MED ORDER — SERTRALINE HCL 100 MG PO TABS
200.0000 mg | ORAL_TABLET | Freq: Every day | ORAL | 0 refills | Status: AC
  Filled 2024-05-19: qty 180, 90d supply, fill #0

## 2024-05-20 ENCOUNTER — Other Ambulatory Visit: Payer: Self-pay

## 2024-05-26 ENCOUNTER — Ambulatory Visit: Payer: 59 | Admitting: Dermatology

## 2024-06-24 ENCOUNTER — Other Ambulatory Visit: Payer: Self-pay

## 2024-06-29 ENCOUNTER — Other Ambulatory Visit: Payer: Self-pay

## 2024-06-30 ENCOUNTER — Other Ambulatory Visit: Payer: Self-pay

## 2024-07-01 ENCOUNTER — Other Ambulatory Visit: Payer: Self-pay

## 2024-07-01 MED ORDER — BUPROPION HCL ER (SR) 150 MG PO TB12
150.0000 mg | ORAL_TABLET | Freq: Two times a day (BID) | ORAL | 0 refills | Status: AC
  Filled 2024-07-01: qty 180, 90d supply, fill #0

## 2024-07-01 MED ORDER — HYDROXYZINE PAMOATE 50 MG PO CAPS
50.0000 mg | ORAL_CAPSULE | Freq: Two times a day (BID) | ORAL | 0 refills | Status: DC | PRN
  Filled 2024-07-01: qty 60, 30d supply, fill #0

## 2024-07-01 MED ORDER — SERTRALINE HCL 100 MG PO TABS
200.0000 mg | ORAL_TABLET | Freq: Every day | ORAL | 0 refills | Status: AC
  Filled 2024-07-01: qty 180, 90d supply, fill #0

## 2024-07-22 ENCOUNTER — Other Ambulatory Visit: Payer: Self-pay

## 2024-07-23 ENCOUNTER — Other Ambulatory Visit: Payer: Self-pay

## 2024-07-26 ENCOUNTER — Other Ambulatory Visit: Payer: Self-pay

## 2024-07-27 ENCOUNTER — Other Ambulatory Visit: Payer: Self-pay

## 2024-07-27 MED ORDER — MOUNJARO 10 MG/0.5ML ~~LOC~~ SOAJ
10.0000 mg | SUBCUTANEOUS | 1 refills | Status: DC
  Filled 2024-07-27: qty 2, 28d supply, fill #0
  Filled 2024-08-31: qty 2, 28d supply, fill #1

## 2024-08-17 ENCOUNTER — Other Ambulatory Visit: Payer: Self-pay

## 2024-08-17 DIAGNOSIS — F411 Generalized anxiety disorder: Secondary | ICD-10-CM | POA: Diagnosis not present

## 2024-08-17 DIAGNOSIS — F5105 Insomnia due to other mental disorder: Secondary | ICD-10-CM | POA: Diagnosis not present

## 2024-08-17 DIAGNOSIS — F331 Major depressive disorder, recurrent, moderate: Secondary | ICD-10-CM | POA: Diagnosis not present

## 2024-08-17 MED ORDER — ESZOPICLONE 3 MG PO TABS
3.0000 mg | ORAL_TABLET | Freq: Every evening | ORAL | 0 refills | Status: AC | PRN
  Filled 2024-09-20 – 2024-09-21 (×2): qty 90, 90d supply, fill #0

## 2024-08-17 MED ORDER — SERTRALINE HCL 100 MG PO TABS
200.0000 mg | ORAL_TABLET | Freq: Every day | ORAL | 0 refills | Status: AC
  Filled 2024-09-20: qty 180, 90d supply, fill #0

## 2024-08-17 MED ORDER — AMPHETAMINE-DEXTROAMPHETAMINE 20 MG PO TABS
20.0000 mg | ORAL_TABLET | Freq: Two times a day (BID) | ORAL | 0 refills | Status: DC
  Filled 2024-08-17: qty 180, 90d supply, fill #0

## 2024-08-17 MED ORDER — BUPROPION HCL ER (SR) 150 MG PO TB12: 150.0000 mg | ORAL_TABLET | Freq: Two times a day (BID) | ORAL | 0 refills | Status: AC

## 2024-08-17 MED ORDER — HYDROXYZINE PAMOATE 50 MG PO CAPS
50.0000 mg | ORAL_CAPSULE | Freq: Two times a day (BID) | ORAL | 0 refills | Status: DC | PRN
  Filled 2024-08-17: qty 180, 90d supply, fill #0

## 2024-08-18 ENCOUNTER — Other Ambulatory Visit: Payer: Self-pay

## 2024-08-23 ENCOUNTER — Other Ambulatory Visit: Payer: Self-pay

## 2024-08-23 MED ORDER — FLUTICASONE PROPIONATE 50 MCG/ACT NA SUSP
2.0000 | Freq: Every day | NASAL | 2 refills | Status: AC
  Filled 2024-08-23: qty 16, 30d supply, fill #0
  Filled 2024-09-20: qty 16, 30d supply, fill #1
  Filled 2024-10-26: qty 16, 30d supply, fill #2

## 2024-08-31 ENCOUNTER — Other Ambulatory Visit: Payer: Self-pay

## 2024-09-07 ENCOUNTER — Other Ambulatory Visit: Payer: Self-pay

## 2024-09-08 ENCOUNTER — Other Ambulatory Visit: Payer: Self-pay

## 2024-09-20 ENCOUNTER — Other Ambulatory Visit: Payer: Self-pay

## 2024-09-20 MED ORDER — MOUNJARO 10 MG/0.5ML ~~LOC~~ SOAJ
10.0000 mg | SUBCUTANEOUS | 1 refills | Status: AC
  Filled 2024-09-20 – 2024-09-27 (×2): qty 2, 28d supply, fill #0
  Filled 2024-10-26: qty 2, 28d supply, fill #1

## 2024-09-20 MED ORDER — PANTOPRAZOLE SODIUM 40 MG PO TBEC
40.0000 mg | DELAYED_RELEASE_TABLET | Freq: Two times a day (BID) | ORAL | 1 refills | Status: AC
  Filled 2024-09-20: qty 180, 90d supply, fill #0

## 2024-09-21 ENCOUNTER — Other Ambulatory Visit: Payer: Self-pay

## 2024-09-21 MED ORDER — SERTRALINE HCL 100 MG PO TABS
200.0000 mg | ORAL_TABLET | Freq: Every day | ORAL | 0 refills | Status: AC
  Filled 2024-09-21: qty 180, 180d supply, fill #0

## 2024-09-21 MED ORDER — BUPROPION HCL ER (SR) 150 MG PO TB12
150.0000 mg | ORAL_TABLET | Freq: Two times a day (BID) | ORAL | 0 refills | Status: AC
  Filled 2024-09-21: qty 180, 90d supply, fill #0

## 2024-09-21 MED ORDER — ESZOPICLONE 3 MG PO TABS
3.0000 mg | ORAL_TABLET | Freq: Every evening | ORAL | 0 refills | Status: AC | PRN
  Filled 2024-09-21: qty 90, 90d supply, fill #0

## 2024-09-27 ENCOUNTER — Other Ambulatory Visit: Payer: Self-pay

## 2024-09-28 ENCOUNTER — Other Ambulatory Visit: Payer: Self-pay

## 2024-10-27 ENCOUNTER — Other Ambulatory Visit: Payer: Self-pay

## 2024-10-27 ENCOUNTER — Encounter: Payer: Self-pay | Admitting: Pharmacist

## 2024-10-27 MED ORDER — FLUOXETINE HCL 20 MG PO CAPS
20.0000 mg | ORAL_CAPSULE | Freq: Every day | ORAL | 0 refills | Status: AC
  Filled 2024-10-27: qty 30, 30d supply, fill #0

## 2024-10-27 MED ORDER — AMPHETAMINE-DEXTROAMPHETAMINE 20 MG PO TABS
20.0000 mg | ORAL_TABLET | Freq: Two times a day (BID) | ORAL | 0 refills | Status: AC
  Filled 2024-10-27: qty 180, 90d supply, fill #0

## 2024-10-27 MED ORDER — HYDROXYZINE PAMOATE 50 MG PO CAPS
50.0000 mg | ORAL_CAPSULE | Freq: Two times a day (BID) | ORAL | 0 refills | Status: AC | PRN
  Filled 2024-10-27: qty 180, 90d supply, fill #0

## 2024-10-27 MED ORDER — ESZOPICLONE 3 MG PO TABS
3.0000 mg | ORAL_TABLET | Freq: Every evening | ORAL | 0 refills | Status: AC | PRN
  Filled 2024-10-27: qty 90, 90d supply, fill #0

## 2024-10-27 MED ORDER — BUPROPION HCL ER (SR) 150 MG PO TB12
150.0000 mg | ORAL_TABLET | Freq: Two times a day (BID) | ORAL | 0 refills | Status: AC
  Filled 2024-10-27: qty 180, 90d supply, fill #0

## 2024-10-28 ENCOUNTER — Other Ambulatory Visit: Payer: Self-pay
